# Patient Record
Sex: Female | Born: 1960
Health system: Southern US, Community
[De-identification: ages and names within clinical notes are randomized; demographics above are authoritative.]

## PROBLEM LIST (undated history)

## (undated) DIAGNOSIS — F329 Major depressive disorder, single episode, unspecified: Secondary | ICD-10-CM

## (undated) DIAGNOSIS — F32A Depression, unspecified: Secondary | ICD-10-CM

## (undated) DIAGNOSIS — E785 Hyperlipidemia, unspecified: Secondary | ICD-10-CM

## (undated) DIAGNOSIS — M1990 Unspecified osteoarthritis, unspecified site: Secondary | ICD-10-CM

## (undated) DIAGNOSIS — F419 Anxiety disorder, unspecified: Secondary | ICD-10-CM

## (undated) DIAGNOSIS — G4730 Sleep apnea, unspecified: Secondary | ICD-10-CM

## (undated) DIAGNOSIS — T7840XA Allergy, unspecified, initial encounter: Secondary | ICD-10-CM

## (undated) DIAGNOSIS — F319 Bipolar disorder, unspecified: Secondary | ICD-10-CM

## (undated) DIAGNOSIS — D649 Anemia, unspecified: Secondary | ICD-10-CM

## (undated) HISTORY — DX: Anemia, unspecified: D64.9

## (undated) HISTORY — DX: Hyperlipidemia, unspecified: E78.5

## (undated) HISTORY — DX: Unspecified osteoarthritis, unspecified site: M19.90

## (undated) HISTORY — DX: Bipolar disorder, unspecified: F31.9

## (undated) HISTORY — DX: Depression, unspecified: F32.A

## (undated) HISTORY — DX: Anxiety disorder, unspecified: F41.9

## (undated) HISTORY — DX: Sleep apnea, unspecified: G47.30

## (undated) HISTORY — DX: Major depressive disorder, single episode, unspecified: F32.9

## (undated) HISTORY — PX: SIGMOIDOSCOPY: SUR1295

## (undated) HISTORY — DX: Allergy, unspecified, initial encounter: T78.40XA

---

## 1998-11-07 ENCOUNTER — Other Ambulatory Visit: Admission: RE | Admit: 1998-11-07 | Discharge: 1998-11-07 | Payer: Self-pay | Admitting: Obstetrics and Gynecology

## 1999-04-12 ENCOUNTER — Emergency Department (HOSPITAL_COMMUNITY): Admission: EM | Admit: 1999-04-12 | Discharge: 1999-04-12 | Payer: Self-pay | Admitting: Emergency Medicine

## 2000-02-29 ENCOUNTER — Other Ambulatory Visit: Admission: RE | Admit: 2000-02-29 | Discharge: 2000-02-29 | Payer: Self-pay | Admitting: Obstetrics & Gynecology

## 2001-06-04 ENCOUNTER — Other Ambulatory Visit: Admission: RE | Admit: 2001-06-04 | Discharge: 2001-06-04 | Payer: Self-pay | Admitting: Obstetrics and Gynecology

## 2001-11-28 ENCOUNTER — Inpatient Hospital Stay (HOSPITAL_COMMUNITY): Admission: AD | Admit: 2001-11-28 | Discharge: 2001-11-28 | Payer: Self-pay | Admitting: Obstetrics and Gynecology

## 2003-09-14 ENCOUNTER — Emergency Department (HOSPITAL_COMMUNITY): Admission: EM | Admit: 2003-09-14 | Discharge: 2003-09-14 | Payer: Self-pay | Admitting: Emergency Medicine

## 2003-09-15 ENCOUNTER — Emergency Department (HOSPITAL_COMMUNITY): Admission: EM | Admit: 2003-09-15 | Discharge: 2003-09-15 | Payer: Self-pay | Admitting: Emergency Medicine

## 2003-10-11 ENCOUNTER — Other Ambulatory Visit: Admission: RE | Admit: 2003-10-11 | Discharge: 2003-10-11 | Payer: Self-pay | Admitting: Obstetrics and Gynecology

## 2004-09-12 ENCOUNTER — Other Ambulatory Visit: Admission: RE | Admit: 2004-09-12 | Discharge: 2004-09-12 | Payer: Self-pay | Admitting: Obstetrics and Gynecology

## 2004-09-13 ENCOUNTER — Ambulatory Visit: Payer: Self-pay | Admitting: Internal Medicine

## 2004-10-05 ENCOUNTER — Ambulatory Visit: Payer: Self-pay | Admitting: Internal Medicine

## 2004-12-08 ENCOUNTER — Ambulatory Visit: Payer: Self-pay | Admitting: Internal Medicine

## 2004-12-15 ENCOUNTER — Ambulatory Visit: Payer: Self-pay | Admitting: Internal Medicine

## 2005-02-08 ENCOUNTER — Ambulatory Visit: Payer: Self-pay | Admitting: Internal Medicine

## 2005-08-10 ENCOUNTER — Ambulatory Visit: Payer: Self-pay | Admitting: Internal Medicine

## 2005-09-14 ENCOUNTER — Other Ambulatory Visit: Admission: RE | Admit: 2005-09-14 | Discharge: 2005-09-14 | Payer: Self-pay | Admitting: Obstetrics and Gynecology

## 2006-12-12 ENCOUNTER — Ambulatory Visit: Payer: Self-pay | Admitting: Internal Medicine

## 2007-01-31 ENCOUNTER — Ambulatory Visit: Payer: Self-pay | Admitting: Internal Medicine

## 2007-01-31 LAB — CONVERTED CEMR LAB
ALT: 11 units/L (ref 0–40)
AST: 17 units/L (ref 0–37)
Albumin: 3.1 g/dL — ABNORMAL LOW (ref 3.5–5.2)
Alkaline Phosphatase: 66 units/L (ref 39–117)
BUN: 10 mg/dL (ref 6–23)
Basophils Absolute: 0 10*3/uL (ref 0.0–0.1)
Basophils Relative: 0.2 % (ref 0.0–1.0)
Bilirubin, Direct: 0.1 mg/dL (ref 0.0–0.3)
CO2: 27 meq/L (ref 19–32)
Calcium: 9.1 mg/dL (ref 8.4–10.5)
Chloride: 107 meq/L (ref 96–112)
Cholesterol: 173 mg/dL (ref 0–200)
Creatinine, Ser: 0.7 mg/dL (ref 0.4–1.2)
Eosinophils Absolute: 0.1 10*3/uL (ref 0.0–0.6)
Eosinophils Relative: 0.9 % (ref 0.0–5.0)
GFR calc Af Amer: 117 mL/min
GFR calc non Af Amer: 97 mL/min
Glucose, Bld: 83 mg/dL (ref 70–99)
HCT: 38.5 % (ref 36.0–46.0)
HDL: 54.1 mg/dL (ref >39.0)
Hemoglobin: 13.4 g/dL (ref 12.0–15.0)
LDL Cholesterol: 97 mg/dL (ref 0–99)
Lymphocytes Relative: 51.2 % — ABNORMAL HIGH (ref 12.0–46.0)
MCHC: 34.8 g/dL (ref 30.0–36.0)
MCV: 90.1 fL (ref 78.0–100.0)
Monocytes Absolute: 1 10*3/uL — ABNORMAL HIGH (ref 0.2–0.7)
Monocytes Relative: 10.9 % (ref 3.0–11.0)
Neutro Abs: 3.4 10*3/uL (ref 1.4–7.7)
Neutrophils Relative %: 36.8 % — ABNORMAL LOW (ref 43.0–77.0)
Platelets: 201 10*3/uL (ref 150–400)
Potassium: 4.1 meq/L (ref 3.5–5.1)
RBC: 4.28 M/uL (ref 3.87–5.11)
RDW: 12.2 % (ref 11.5–14.6)
Sodium: 140 meq/L (ref 135–145)
TSH: 1.67 microintl units/mL (ref 0.35–5.50)
Total Bilirubin: 0.5 mg/dL (ref 0.3–1.2)
Total CHOL/HDL Ratio: 3.2
Total Protein: 6.6 g/dL (ref 6.0–8.3)
Triglycerides: 110 mg/dL (ref 0–149)
VLDL: 22 mg/dL (ref 0–40)
WBC: 9.3 10*3/uL (ref 4.5–10.5)

## 2007-02-07 ENCOUNTER — Ambulatory Visit: Payer: Self-pay | Admitting: Internal Medicine

## 2007-06-26 DIAGNOSIS — F329 Major depressive disorder, single episode, unspecified: Secondary | ICD-10-CM

## 2007-06-26 DIAGNOSIS — F419 Anxiety disorder, unspecified: Secondary | ICD-10-CM

## 2007-09-26 ENCOUNTER — Ambulatory Visit: Payer: Self-pay | Admitting: Family Medicine

## 2007-09-26 DIAGNOSIS — E785 Hyperlipidemia, unspecified: Secondary | ICD-10-CM | POA: Insufficient documentation

## 2007-09-26 DIAGNOSIS — L259 Unspecified contact dermatitis, unspecified cause: Secondary | ICD-10-CM

## 2007-09-26 DIAGNOSIS — L280 Lichen simplex chronicus: Secondary | ICD-10-CM

## 2007-09-26 DIAGNOSIS — M549 Dorsalgia, unspecified: Secondary | ICD-10-CM | POA: Insufficient documentation

## 2008-06-28 LAB — CONVERTED CEMR LAB: Pap Smear: NORMAL

## 2008-06-28 LAB — HM MAMMOGRAPHY

## 2009-01-03 ENCOUNTER — Telehealth: Payer: Self-pay | Admitting: Internal Medicine

## 2009-02-25 ENCOUNTER — Telehealth: Payer: Self-pay | Admitting: Internal Medicine

## 2009-04-18 ENCOUNTER — Ambulatory Visit: Payer: Self-pay | Admitting: Internal Medicine

## 2009-04-18 LAB — CONVERTED CEMR LAB
ALT: 9 units/L (ref 0–35)
AST: 21 units/L (ref 0–37)
Albumin: 3.2 g/dL — ABNORMAL LOW (ref 3.5–5.2)
Alkaline Phosphatase: 66 units/L (ref 39–117)
BUN: 10 mg/dL (ref 6–23)
Basophils Absolute: 0 10*3/uL (ref 0.0–0.1)
Basophils Relative: 0.2 % (ref 0.0–3.0)
Bilirubin Urine: NEGATIVE
Bilirubin, Direct: 0 mg/dL (ref 0.0–0.3)
Blood in Urine, dipstick: NEGATIVE
CO2: 26 meq/L (ref 19–32)
Calcium: 8.9 mg/dL (ref 8.4–10.5)
Chloride: 107 meq/L (ref 96–112)
Cholesterol: 181 mg/dL (ref 0–200)
Creatinine, Ser: 0.7 mg/dL (ref 0.4–1.2)
Eosinophils Absolute: 0.1 10*3/uL (ref 0.0–0.7)
Eosinophils Relative: 1.1 % (ref 0.0–5.0)
GFR calc non Af Amer: 115.9 mL/min (ref >60)
Glucose, Bld: 80 mg/dL (ref 70–99)
Glucose, Urine, Semiquant: NEGATIVE
HCT: 39.1 % (ref 36.0–46.0)
HDL: 59.5 mg/dL (ref >39.00)
Hemoglobin: 13.4 g/dL (ref 12.0–15.0)
Ketones, urine, test strip: NEGATIVE
LDL Cholesterol: 95 mg/dL (ref 0–99)
Lymphocytes Relative: 56.1 % — ABNORMAL HIGH (ref 12.0–46.0)
Lymphs Abs: 5.2 10*3/uL — ABNORMAL HIGH (ref 0.7–4.0)
MCHC: 34.2 g/dL (ref 30.0–36.0)
MCV: 91.8 fL (ref 78.0–100.0)
Monocytes Absolute: 0.8 10*3/uL (ref 0.1–1.0)
Monocytes Relative: 8.6 % (ref 3.0–12.0)
Neutro Abs: 3.1 10*3/uL (ref 1.4–7.7)
Neutrophils Relative %: 34 % — ABNORMAL LOW (ref 43.0–77.0)
Nitrite: NEGATIVE
Platelets: 174 10*3/uL (ref 150.0–400.0)
Potassium: 4.2 meq/L (ref 3.5–5.1)
Protein, U semiquant: NEGATIVE
RBC: 4.26 M/uL (ref 3.87–5.11)
RDW: 11.8 % (ref 11.5–14.6)
Sodium: 141 meq/L (ref 135–145)
Specific Gravity, Urine: 1.02
TSH: 1.57 microintl units/mL (ref 0.35–5.50)
Total Bilirubin: 0.7 mg/dL (ref 0.3–1.2)
Total CHOL/HDL Ratio: 3
Total Protein: 6.9 g/dL (ref 6.0–8.3)
Triglycerides: 132 mg/dL (ref 0.0–149.0)
Urobilinogen, UA: 0.2
VLDL: 26.4 mg/dL (ref 0.0–40.0)
WBC Urine, dipstick: NEGATIVE
WBC: 9.2 10*3/uL (ref 4.5–10.5)
pH: 6

## 2009-04-25 ENCOUNTER — Ambulatory Visit: Payer: Self-pay | Admitting: Internal Medicine

## 2009-06-07 ENCOUNTER — Ambulatory Visit: Payer: Self-pay | Admitting: Internal Medicine

## 2009-06-07 LAB — CONVERTED CEMR LAB
ALT: 9 units/L (ref 0–35)
AST: 18 units/L (ref 0–37)
Albumin: 3.2 g/dL — ABNORMAL LOW (ref 3.5–5.2)
Alkaline Phosphatase: 64 units/L (ref 39–117)
Bilirubin, Direct: 0 mg/dL (ref 0.0–0.3)
Cholesterol: 196 mg/dL (ref 0–200)
HDL: 50.2 mg/dL (ref >39.00)
LDL Cholesterol: 118 mg/dL — ABNORMAL HIGH (ref 0–99)
Total Bilirubin: 0.5 mg/dL (ref 0.3–1.2)
Total CHOL/HDL Ratio: 4
Total Protein: 7 g/dL (ref 6.0–8.3)
Triglycerides: 137 mg/dL (ref 0.0–149.0)
VLDL: 27.4 mg/dL (ref 0.0–40.0)

## 2009-06-14 ENCOUNTER — Ambulatory Visit: Payer: Self-pay | Admitting: Internal Medicine

## 2009-06-24 ENCOUNTER — Encounter: Payer: Self-pay | Admitting: Internal Medicine

## 2009-09-26 ENCOUNTER — Ambulatory Visit: Payer: Self-pay | Admitting: Internal Medicine

## 2009-09-26 DIAGNOSIS — R635 Abnormal weight gain: Secondary | ICD-10-CM | POA: Insufficient documentation

## 2010-03-16 ENCOUNTER — Telehealth: Payer: Self-pay | Admitting: Internal Medicine

## 2010-04-18 ENCOUNTER — Ambulatory Visit: Payer: Self-pay | Admitting: Internal Medicine

## 2010-04-18 DIAGNOSIS — B353 Tinea pedis: Secondary | ICD-10-CM | POA: Insufficient documentation

## 2010-04-18 LAB — CONVERTED CEMR LAB
Cholesterol, target level: 200 mg/dL
HDL goal, serum: 40 mg/dL
LDL Goal: 160 mg/dL

## 2010-07-19 ENCOUNTER — Ambulatory Visit: Payer: Self-pay | Admitting: Internal Medicine

## 2010-07-19 LAB — CONVERTED CEMR LAB
ALT: 9 units/L (ref 0–35)
AST: 16 units/L (ref 0–37)
Albumin: 3.3 g/dL — ABNORMAL LOW (ref 3.5–5.2)
Alkaline Phosphatase: 61 units/L (ref 39–117)
BUN: 9 mg/dL (ref 6–23)
Basophils Absolute: 0 10*3/uL (ref 0.0–0.1)
Basophils Relative: 0.5 % (ref 0.0–3.0)
Bilirubin, Direct: 0.1 mg/dL (ref 0.0–0.3)
Blood in Urine, dipstick: NEGATIVE
CO2: 27 meq/L (ref 19–32)
Calcium: 9.1 mg/dL (ref 8.4–10.5)
Chloride: 105 meq/L (ref 96–112)
Cholesterol: 217 mg/dL — ABNORMAL HIGH (ref 0–200)
Creatinine, Ser: 0.6 mg/dL (ref 0.4–1.2)
Direct LDL: 145.4 mg/dL
Eosinophils Absolute: 0.1 10*3/uL (ref 0.0–0.7)
Eosinophils Relative: 0.8 % (ref 0.0–5.0)
GFR calc non Af Amer: 130.17 mL/min (ref >60)
Glucose, Bld: 75 mg/dL (ref 70–99)
Glucose, Urine, Semiquant: NEGATIVE
HCT: 38.5 % (ref 36.0–46.0)
HDL: 51 mg/dL (ref >39.00)
Hemoglobin: 13.2 g/dL (ref 12.0–15.0)
Lymphocytes Relative: 54.4 % — ABNORMAL HIGH (ref 12.0–46.0)
Lymphs Abs: 4.7 10*3/uL — ABNORMAL HIGH (ref 0.7–4.0)
MCHC: 34.3 g/dL (ref 30.0–36.0)
MCV: 94 fL (ref 78.0–100.0)
Monocytes Absolute: 0.7 10*3/uL (ref 0.1–1.0)
Monocytes Relative: 8.4 % (ref 3.0–12.0)
Neutro Abs: 3.1 10*3/uL (ref 1.4–7.7)
Neutrophils Relative %: 35.9 % — ABNORMAL LOW (ref 43.0–77.0)
Nitrite: NEGATIVE
Platelets: 183 10*3/uL (ref 150.0–400.0)
Potassium: 4.1 meq/L (ref 3.5–5.1)
RBC: 4.09 M/uL (ref 3.87–5.11)
RDW: 12.6 % (ref 11.5–14.6)
Sodium: 139 meq/L (ref 135–145)
Specific Gravity, Urine: 1.02
TSH: 1.99 microintl units/mL (ref 0.35–5.50)
Total Bilirubin: 0.3 mg/dL (ref 0.3–1.2)
Total CHOL/HDL Ratio: 4
Total Protein: 6.4 g/dL (ref 6.0–8.3)
Triglycerides: 151 mg/dL — ABNORMAL HIGH (ref 0.0–149.0)
Urobilinogen, UA: 0.2
VLDL: 30.2 mg/dL (ref 0.0–40.0)
WBC Urine, dipstick: NEGATIVE
WBC: 8.6 10*3/uL (ref 4.5–10.5)
pH: 6.5

## 2010-08-02 ENCOUNTER — Ambulatory Visit: Payer: Self-pay | Admitting: Internal Medicine

## 2010-08-03 ENCOUNTER — Telehealth (INDEPENDENT_AMBULATORY_CARE_PROVIDER_SITE_OTHER): Payer: Self-pay | Admitting: *Deleted

## 2010-10-02 ENCOUNTER — Telehealth: Payer: Self-pay | Admitting: Internal Medicine

## 2010-10-31 ENCOUNTER — Ambulatory Visit
Admission: RE | Admit: 2010-10-31 | Discharge: 2010-10-31 | Payer: Self-pay | Source: Home / Self Care | Attending: Internal Medicine | Admitting: Internal Medicine

## 2010-10-31 LAB — CONVERTED CEMR LAB
ALT: 9 units/L (ref 0–35)
AST: 16 units/L (ref 0–37)
Albumin: 3.1 g/dL — ABNORMAL LOW (ref 3.5–5.2)
Alkaline Phosphatase: 62 units/L (ref 39–117)
Bilirubin, Direct: 0.1 mg/dL (ref 0.0–0.3)
Cholesterol: 185 mg/dL (ref 0–200)
HDL: 52.4 mg/dL (ref >39.00)
LDL Cholesterol: 103 mg/dL — ABNORMAL HIGH (ref 0–99)
Total Bilirubin: 0.5 mg/dL (ref 0.3–1.2)
Total CHOL/HDL Ratio: 4
Total Protein: 7 g/dL (ref 6.0–8.3)
Triglycerides: 150 mg/dL — ABNORMAL HIGH (ref 0.0–149.0)
VLDL: 30 mg/dL (ref 0.0–40.0)

## 2010-11-10 ENCOUNTER — Ambulatory Visit
Admission: RE | Admit: 2010-11-10 | Discharge: 2010-11-10 | Payer: Self-pay | Source: Home / Self Care | Attending: Internal Medicine | Admitting: Internal Medicine

## 2010-11-28 NOTE — Progress Notes (Signed)
Summary: allergic reaction   LMTCB 03/17/2010  Phone Note Call from Patient   Caller: Patient Call For: Stacie Glaze MD Summary of Call: Pt states she developed blisters and itching on both feet after wearing a new pair of [place] shoes without socks.  She had a similar experience last year after wearing flip flops, and was told she may have a latex allergy.  She took Allegra for the itching and Cortisone cream on her feet.  Asking for advice from Dr Lovell Sheehan. [phone] Initial call taken by: Lynann Beaver CMA,  Mar 16, 2010 4:38 PM  Follow-up for Phone Call        call in lotrisome cream and apply two times a day 30 grams Follow-up by: Stacie Glaze MD,  Mar 17, 2010 8:07 AM  Additional Follow-up for Phone Call Additional follow up Details #1::        [facility] ([facility]) with name of pharmacy.  Pt called back to adv  [facility] on [address].... Debbra Riding, Mar 17, 2010 11:56AM  Additional Follow-up by: Lynann Beaver CMA,  Mar 17, 2010 8:45 AM    New/Updated Medications: LOTRISONE 1-0.05 % LOTN (CLOTRIMAZOLE-BETAMETHASONE) apply two times a day Prescriptions: LOTRISONE 1-0.05 % LOTN (CLOTRIMAZOLE-BETAMETHASONE) apply two times a day  #30 gm x 0   Entered by:   Lynann Beaver CMA   Authorized by:   Stacie Glaze MD   Signed by:   Lynann Beaver CMA on 03/17/2010   Method used:   Electronically to        [facility] [phone]* (retail)       [address]       [place], [place]  [address]       Ph: [phone]       Fax: [phone]   RxID:   [id number]

## 2010-11-28 NOTE — Assessment & Plan Note (Signed)
Summary: CPX/NO PAP/NJR   Vital Signs:  Patient profile:   50 year old female Height:      65 inches Weight:      176 pounds Temp:     98.2 degrees F oral Pulse rate:   72 / minute Resp:     14 per minute BP sitting:   110 / 80  (left arm)  Vitals Entered By: Willy Eddy, LPN (August 02, 2010 2:16 PM) CC: roa labs, Lipid Management, Hypertension Management Is Patient Diabetic? No   Primary Care Provider:  Stacie Glaze MD  CC:  roa labs, Lipid Management, and Hypertension Management.  History of Present Illness: does not want CPX prefers monitering of chronic conditions monitering lipids on fish oil and niacin but the lipids have continues to risk mother had elevated lipids  Hypertension History:      She denies headache, chest pain, palpitations, dyspnea with exertion, orthopnea, PND, peripheral edema, visual symptoms, neurologic problems, syncope, and side effects from treatment.        Positive major cardiovascular risk factors include hyperlipidemia.  Negative major cardiovascular risk factors include female age less than 97 years old, no history of hypertension, and non-tobacco-user status.        Further assessment for target organ damage reveals no history of ASHD, stroke/TIA, or peripheral vascular disease.    Lipid Management History:      Negative NCEP/ATP III risk factors include female age less than 60 years old, non-tobacco-user status, non-hypertensive, no ASHD (atherosclerotic heart disease), no prior stroke/TIA, no peripheral vascular disease, and no history of aortic aneurysm.      Preventive Screening-Counseling & Management  Alcohol-Tobacco     Smoking Status: never     Tobacco Counseling: not indicated; no tobacco use  Current Problems (verified): 1)  Dermatophytosis of Foot  (ICD-110.4) 2)  Weight Gain  (ICD-783.1) 3)  Preventive Health Care  (ICD-V70.0) 4)  Lichenification  (ICD-698.3) 5)  Contact Dermatitis  (ICD-692.9) 6)   Hyperlipidemia  (ICD-272.4) 7)  Family History Diabetes 1st Degree Relative  (ICD-V18.0) 8)  Family History of Colon Ca 1st Degree Relative <60  (ICD-V16.0) 9)  Family History of Cad Female 1st Degree Relative <50  (ICD-V17.3) 10)  Back Pain, Chronic  (ICD-724.5) 11)  Depression  (ICD-311)  Current Medications (verified): 1)  Zoloft 50 Mg  Tabs (Sertraline Hcl) .... Take 1 Tablet By Mouth Once A Day 2)  Depakote 125 Mg  Tbec (Divalproex Sodium) .... Take 3 Tablet By Mouth At Bedtime 3)  Yaz 3-0.02 Mg  Tabs (Drospirenone-Ethinyl Estradiol) .... Take 1 Tablet By Mouth Once A Day 4)  Ambien 10 Mg  Tabs (Zolpidem Tartrate) .... As Needed 5)  Diprolene Af 0.05 %  Crea (Aug Betamethasone Dipropionate) .... Apply Three Times A Day As Needed Rash 6)  Lac-Hydrin 12 %  Crea (Ammonium Lactate) .... Apply Two Times A Day To Soles of Feet 7)  Krill Oil 1000 Mg Caps (Krill Oil) .... One By Mouth Bid 8)  Niacin Flush Free 500 Mg Caps (Inositol Niacinate) .... One By Mouth Daily Witha Low Fat Snak 9)  Lotrisone 1-0.05 % Lotn (Clotrimazole-Betamethasone) .... Apply Two Times A Day 10)  Livalo 4 Mg Tabs (Pitavastatin Calcium) .... One By Mouth Weekly  Allergies (verified): 1)  ! Penicillin V Potassium (Penicillin V Potassium)  Contraindications/Deferment of Procedures/Staging:    Test/Procedure: FLU VAX    Reason for deferment: patient declined   Past History:  Family History: Last  updated: 09/26/2007 Family History of CAD Female 1st degree relative <50 Family History of Colon CA 1st degree relative <60 Family History Diabetes 1st degree relative Family History Hypertension  Risk Factors: Smoking Status: never (08/02/2010)  Past medical, surgical, family and social histories (including risk factors) reviewed, and no changes noted (except as noted below).  Past Medical History: Reviewed history from 09/26/2007 and no changes required. Depression Hyperlipidemia  Past Surgical History: Reviewed  history from 09/26/2007 and no changes required. Caesarean section Sigmoidoscopy  Family History: Reviewed history from 09/26/2007 and no changes required. Family History of CAD Female 1st degree relative <50 Family History of Colon CA 1st degree relative <60 Family History Diabetes 1st degree relative Family History Hypertension  Social History: Reviewed history and no changes required.  Review of Systems  The patient denies anorexia, fever, weight loss, weight gain, vision loss, decreased hearing, hoarseness, chest pain, syncope, dyspnea on exertion, peripheral edema, prolonged cough, headaches, hemoptysis, abdominal pain, melena, hematochezia, severe indigestion/heartburn, hematuria, incontinence, genital sores, muscle weakness, suspicious skin lesions, transient blindness, difficulty walking, depression, unusual weight change, abnormal bleeding, enlarged lymph nodes, angioedema, and breast masses.    Physical Exam  General:  Well-developed,well-nourished,in no acute distress; alert,appropriate and cooperative throughout examination Head:  normocephalic and no abnormalities palpated.   Eyes:  pupils equal and pupils round.   Nose:  no external deformity and no nasal discharge.   Neck:  No deformities, masses, or tenderness noted. Lungs:  normal respiratory effort, no crackles, and no wheezes.   Heart:  normal rate and regular rhythm.   Abdomen:  Bowel sounds positive,abdomen soft and non-tender without masses, organomegaly or hernias noted. Msk:  No deformity or scoliosis noted of thoracic or lumbar spine.   Extremities:  No clubbing, cyanosis, edema, or deformity noted with normal full range of motion of all joints.   Neurologic:  alert & oriented X3 and finger-to-nose normal.     Impression & Recommendations:  Problem # 1:  HYPERLIPIDEMIA (ICD-272.4) Assessment Deteriorated pulse of statin trial livalo one by mouth weekly Labs Reviewed: SGOT: 16 (07/19/2010)   SGPT: 9  (07/19/2010)  Lipid Goals: Chol Goal: 200 (04/18/2010)   HDL Goal: 40 (04/18/2010)   LDL Goal: 160 (04/18/2010)   TG Goal: 150 (04/18/2010)  Prior 10 Yr Risk Heart Disease: 3 % (04/18/2010)   HDL:51.00 (07/19/2010), 50.20 (06/07/2009)  LDL:118 (06/07/2009), 95 (16/07/9603)  Chol:217 (07/19/2010), 196 (06/07/2009)  Trig:151.0 (07/19/2010), 137.0 (06/07/2009)  Her updated medication list for this problem includes:    Livalo 4 Mg Tabs (Pitavastatin calcium) ..... One by mouth weekly  Problem # 2:  DEPRESSION (ICD-311)  Her updated medication list for this problem includes:    Zoloft 50 Mg Tabs (Sertraline hcl) .Marland Kitchen... Take 1 tablet by mouth once a day  Discussed treatment options, including trial of antidpressant medication. Will refer to behavioral health. Follow-up call in in 24-48 hours and recheck in 2 weeks, sooner as needed. Patient agrees to call if any worsening of symptoms or thoughts of doing harm arise. Verified that the patient has no suicidal ideation at this time.   Problem # 3:  PREVENTIVE HEALTH CARE (ICD-V70.0)  Mammogram: normal (06/28/2008) Pap smear: normal (06/28/2008) Td Booster: Tdap (02/07/2007)   Chol: 217 (07/19/2010)   HDL: 51.00 (07/19/2010)   LDL: 118 (06/07/2009)   TG: 151.0 (07/19/2010) TSH: 1.99 (07/19/2010)   Next mammogram due:: 06/2009 (04/25/2009)  Discussed using sunscreen, use of alcohol, drug use, self breast exam, routine dental care, routine eye  care, schedule for GYN exam, routine physical exam, seat belts, multiple vitamins, osteoporosis prevention, adequate calcium intake in diet, recommendations for immunizations, mammograms and Pap smears.  Discussed exercise and checking cholesterol.  Discussed gun safety, safe sex, and contraception.  Complete Medication List: 1)  Zoloft 50 Mg Tabs (Sertraline hcl) .... Take 1 tablet by mouth once a day 2)  Depakote 125 Mg Tbec (Divalproex sodium) .... Take 3 tablet by mouth at bedtime 3)  Yaz 3-0.02 Mg Tabs  (Drospirenone-ethinyl estradiol) .... Take 1 tablet by mouth once a day 4)  Ambien 10 Mg Tabs (Zolpidem tartrate) .... As needed 5)  Diprolene Af 0.05 % Crea (Aug betamethasone dipropionate) .... Apply three times a day as needed rash 6)  Lac-hydrin 12 % Crea (Ammonium lactate) .... Apply two times a day to soles of feet 7)  Krill Oil 1000 Mg Caps (Krill oil) .... One by mouth bid 8)  Niacin Flush Free 500 Mg Caps (Inositol niacinate) .... One by mouth daily witha low fat snak 9)  Lotrisone 1-0.05 % Lotn (Clotrimazole-betamethasone) .... Apply two times a day 10)  Livalo 4 Mg Tabs (Pitavastatin calcium) .... One by mouth weekly  Hypertension Assessment/Plan:      The patient's hypertensive risk group is category B: At least one risk factor (excluding diabetes) with no target organ damage.  Her calculated 10 year risk of coronary heart disease is 3 %.  Today's blood pressure is 110/80.  Her blood pressure goal is < 140/90.  Lipid Assessment/Plan:      Based on NCEP/ATP III, the patient's risk factor category is "0-1 risk factors".  The patient's lipid goals are as follows: Total cholesterol goal is 200; LDL cholesterol goal is 160; HDL cholesterol goal is 40; Triglyceride goal is 150.    Patient Instructions: 1)  Please schedule a follow-up appointment in 3 months. 2)  Hepatic Panel prior to visit, ICD-9:995.20 3)  Lipid Panel prior to visit, ICD-9:272.4

## 2010-11-28 NOTE — Progress Notes (Signed)
  Phone Note Call from Patient   Caller: Patient Call For: Stacie Glaze MD Reason for Call: Acute Illness Summary of Call: Pt. only states she has sinus infection and would like an antibiotic. [phone] Initial call taken by: [facility] CMA AAMA,  October 02, 2010 10:27 AM  Follow-up for Phone Call        per dr Lovell Sheehan- may have z pack Follow-up by: Willy Eddy, LPN,  October 02, 2010 11:23 AM    New/Updated Medications: ZITHROMAX Z-PAK 250 MG TABS (AZITHROMYCIN) as directed Prescriptions: ZITHROMAX Z-PAK 250 MG TABS (AZITHROMYCIN) as directed  #6 x 0   Entered by:   Lynann Beaver CMA AAMA   Authorized by:   Stacie Glaze MD   Signed by:   Lynann Beaver CMA AAMA on 10/02/2010   Method used:   Electronically to        [facility] [phone]* (retail)       [address]       [place], [place]  [address]       Ph: [phone]       Fax: [phone]   RxID:   [id number]  Notified pt.

## 2010-11-28 NOTE — Progress Notes (Signed)
Summary: work note  Phone Note Call from Patient Call back at [employer] [phone]   Caller: Patient Call For: Stacie Glaze MD Reason for Call: Acute Illness Summary of Call: pt needs work note she was seen 08-02-2010 fax to her attention at [phone]. Initial call taken by: Heron Sabins,  August 03, 2010 8:46 AM  Follow-up for Phone Call        sent up front to be faxed Follow-up by: Willy Eddy, LPN,  August 03, 2010 8:53 AM

## 2010-11-28 NOTE — Assessment & Plan Note (Signed)
Summary: 7 MTH ROA // RS   Vital Signs:  Patient profile:   50 year old female Height:      65 inches Weight:      178 pounds BMI:     29.73 Temp:     98.2 degrees F oral Pulse rate:   72 / minute BP sitting:   120 / 74  (left arm)  Vitals Entered By: Willy Eddy, LPN (April 18, 2010 9:34 AM) CC: roa, Lipid Management   CC:  roa and Lipid Management.  History of Present Illness: some type of allergic reaction to new shoe with blisters on feet had bollous lesion and still has some discoloration using the lotrimin cream went to the podiatrist latex allergies   Lipid Management History:      Negative NCEP/ATP III risk factors include female age less than 4 years old, non-tobacco-user status, non-hypertensive, no ASHD (atherosclerotic heart disease), no prior stroke/TIA, no peripheral vascular disease, and no history of aortic aneurysm.     Preventive Screening-Counseling & Management  Alcohol-Tobacco     Smoking Status: never  Current Problems (verified): 1)  Weight Gain  (ICD-783.1) 2)  Preventive Health Care  (ICD-V70.0) 3)  Lichenification  (ICD-698.3) 4)  Contact Dermatitis  (ICD-692.9) 5)  Hyperlipidemia  (ICD-272.4) 6)  Family History Diabetes 1st Degree Relative  (ICD-V18.0) 7)  Family History of Colon Ca 1st Degree Relative <60  (ICD-V16.0) 8)  Family History of Cad Female 1st Degree Relative <50  (ICD-V17.3) 9)  Back Pain, Chronic  (ICD-724.5) 10)  Depression  (ICD-311)  Current Medications (verified): 1)  Zoloft 50 Mg  Tabs (Sertraline Hcl) .... Take 1 Tablet By Mouth Once A Day 2)  Depakote 125 Mg  Tbec (Divalproex Sodium) .... Take 3 Tablet By Mouth At Bedtime 3)  Yaz 3-0.02 Mg  Tabs (Drospirenone-Ethinyl Estradiol) .... Take 1 Tablet By Mouth Once A Day 4)  Ambien 10 Mg  Tabs (Zolpidem Tartrate) .... As Needed 5)  Diprolene Af 0.05 %  Crea (Aug Betamethasone Dipropionate) .... Apply Three Times A Day As Needed Rash 6)  Lac-Hydrin 12 %  Crea  (Ammonium Lactate) .... Apply Two Times A Day To Soles of Feet 7)  Krill Oil 1000 Mg Caps (Krill Oil) .... One By Mouth Bid 8)  Niacin Flush Free 500 Mg Caps (Inositol Niacinate) .... One By Mouth Daily Witha Low Fat Snak 9)  Lotrisone 1-0.05 % Lotn (Clotrimazole-Betamethasone) .... Apply Two Times A Day  Allergies (verified): 1)  ! Penicillin V Potassium (Penicillin V Potassium)  Past History:  Family History: Last updated: 09/26/2007 Family History of CAD Female 1st degree relative <50 Family History of Colon CA 1st degree relative <60 Family History Diabetes 1st degree relative Family History Hypertension  Risk Factors: Smoking Status: never (04/18/2010)  Past medical, surgical, family and social histories (including risk factors) reviewed, and no changes noted (except as noted below).  Past Medical History: Reviewed history from 09/26/2007 and no changes required. Depression Hyperlipidemia  Past Surgical History: Reviewed history from 09/26/2007 and no changes required. Caesarean section Sigmoidoscopy  Family History: Reviewed history from 09/26/2007 and no changes required. Family History of CAD Female 1st degree relative <50 Family History of Colon CA 1st degree relative <60 Family History Diabetes 1st degree relative Family History Hypertension  Social History: Reviewed history and no changes required. Smoking Status:  never  Review of Systems  The patient denies anorexia, fever, weight loss, weight gain, vision loss, decreased hearing, hoarseness, chest pain,  syncope, dyspnea on exertion, peripheral edema, prolonged cough, headaches, hemoptysis, abdominal pain, melena, hematochezia, severe indigestion/heartburn, hematuria, incontinence, genital sores, muscle weakness, suspicious skin lesions, transient blindness, difficulty walking, depression, unusual weight change, abnormal bleeding, enlarged lymph nodes, angioedema, and breast masses.    Physical Exam  General:   Well-developed,well-nourished,in no acute distress; alert,appropriate and cooperative throughout examination Ears:  External ear exam shows no significant lesions or deformities.  Otoscopic examination reveals clear canals, tympanic membranes are intact bilaterally without bulging, retraction, inflammation or discharge. Hearing is grossly normal bilaterally. Nose:  External nasal examination shows no deformity or inflammation. Nasal mucosa are pink and moist without lesions or exudates. Mouth:  Oral mucosa and oropharynx without lesions or exudates.  Teeth in good repair. Neck:  No deformities, masses, or tenderness noted. Lungs:  normal respiratory effort, no crackles, and no wheezes.   Heart:  normal rate and regular rhythm.   Abdomen:  Bowel sounds positive,abdomen soft and non-tender without masses, organomegaly or hernias noted. Msk:  No deformity or scoliosis noted of thoracic or lumbar spine.   Neurologic:  No cranial nerve deficits noted. Station and gait are normal. Plantar reflexes are down-going bilaterally. DTRs are symmetrical throughout. Sensory, motor and coordinative functions appear intact.   Impression & Recommendations:  Problem # 1:  DERMATOPHYTOSIS OF FOOT (ICD-110.4) the pt needs to tak the lamisil for 90 dermatologist added the oral terbinofin  added Her updated medication list for this problem includes:    Lotrisone 1-0.05 % Lotn (Clotrimazole-betamethasone) .Marland Kitchen... Apply two times a day    Terbinafine Hcl 250 Mg Tabs (Terbinafine hcl) ..... One by mouth daly  Take medication as directed for full duration.   Problem # 2:  HYPERLIPIDEMIA (ICD-272.4)  Labs Reviewed: SGOT: 18 (06/07/2009)   SGPT: 9 (06/07/2009)  Lipid Goals: Chol Goal: 200 (04/18/2010)   HDL Goal: 40 (04/18/2010)   LDL Goal: 160 (04/18/2010)   TG Goal: 150 (04/18/2010)  10 Yr Risk Heart Disease: 3 %   HDL:50.20 (06/07/2009), 59.50 (04/18/2009)  LDL:118 (06/07/2009), 95 (04/54/0981)  Chol:196  (06/07/2009), 181 (04/18/2009)  Trig:137.0 (06/07/2009), 132.0 (04/18/2009)  Problem # 3:  BACK PAIN, CHRONIC (ICD-724.5)  Discussed use of moist heat or ice, modified activities, medications, and stretching/strengthening exercises. Back care instructions given. To be seen in 2 weeks if no improvement; sooner if worsening of symptoms.   Complete Medication List: 1)  Zoloft 50 Mg Tabs (Sertraline hcl) .... Take 1 tablet by mouth once a day 2)  Depakote 125 Mg Tbec (Divalproex sodium) .... Take 3 tablet by mouth at bedtime 3)  Yaz 3-0.02 Mg Tabs (Drospirenone-ethinyl estradiol) .... Take 1 tablet by mouth once a day 4)  Ambien 10 Mg Tabs (Zolpidem tartrate) .... As needed 5)  Diprolene Af 0.05 % Crea (Aug betamethasone dipropionate) .... Apply three times a day as needed rash 6)  Lac-hydrin 12 % Crea (Ammonium lactate) .... Apply two times a day to soles of feet 7)  Krill Oil 1000 Mg Caps (Krill oil) .... One by mouth bid 8)  Niacin Flush Free 500 Mg Caps (Inositol niacinate) .... One by mouth daily witha low fat snak 9)  Lotrisone 1-0.05 % Lotn (Clotrimazole-betamethasone) .... Apply two times a day 10)  Terbinafine Hcl 250 Mg Tabs (Terbinafine hcl) .... One by mouth daly  Lipid Assessment/Plan:      Based on NCEP/ATP III, the patient's risk factor category is "0-1 risk factors".  The patient's lipid goals are as follows: Total cholesterol goal is 200;  LDL cholesterol goal is 160; HDL cholesterol goal is 40; Triglyceride goal is 150.    Patient Instructions: 1)  Please schedule a follow-up appointment in 2 months.  CPX 2)  spray all shoes with antifungal spray

## 2010-11-30 NOTE — Assessment & Plan Note (Signed)
Summary: 3 MTH ROV // RS   Vital Signs:  Patient profile:   50 year old female Height:      65 inches Weight:      180 pounds BMI:     30.06 Temp:     98.2 degrees F oral Pulse rate:   72 / minute Resp:     14 per minute BP sitting:   136 / 80  (left arm)  Vitals Entered By: Willy Eddy, LPN (November 10, 2010 4:26 PM) CC: roa labs Is Patient Diabetic? No   Primary Care Provider:  Stacie Glaze MD  CC:  roa labs.  History of Present Illness:   the patient is is 50 year old African American female who presents for a visit for chronic management of lipids with an acute flare of her chronic back pain. she has a history of musculoskeletal pain in the low back primarily in the SI joint region is exacerbated by both her weight and by musculoskeletal strain. In the past this has responded to a brief course of steroids muscle relaxants and nonsteroidals. She has a history of hyperlipidemia currently on combination of  omega 3 , statin , and niacin.  Preventive Screening-Counseling & Management  Alcohol-Tobacco     Smoking Status: never     Tobacco Counseling: not indicated; no tobacco use  Problems Prior to Update: 1)  Dermatophytosis of Foot  (ICD-110.4) 2)  Weight Gain  (ICD-783.1) 3)  Preventive Health Care  (ICD-V70.0) 4)  Lichenification  (ICD-698.3) 5)  Contact Dermatitis  (ICD-692.9) 6)  Hyperlipidemia  (ICD-272.4) 7)  Family History Diabetes 1st Degree Relative  (ICD-V18.0) 8)  Family History of Colon Ca 1st Degree Relative <60  (ICD-V16.0) 9)  Family History of Cad Female 1st Degree Relative <50  (ICD-V17.3) 10)  Back Pain, Chronic  (ICD-724.5) 11)  Depression  (ICD-311)  Current Problems (verified): 1)  Dermatophytosis of Foot  (ICD-110.4) 2)  Weight Gain  (ICD-783.1) 3)  Preventive Health Care  (ICD-V70.0) 4)  Lichenification  (ICD-698.3) 5)  Contact Dermatitis  (ICD-692.9) 6)  Hyperlipidemia  (ICD-272.4) 7)  Family History Diabetes 1st Degree Relative   (ICD-V18.0) 8)  Family History of Colon Ca 1st Degree Relative <60  (ICD-V16.0) 9)  Family History of Cad Female 1st Degree Relative <50  (ICD-V17.3) 10)  Back Pain, Chronic  (ICD-724.5) 11)  Depression  (ICD-311)  Medications Prior to Update: 1)  Zoloft 50 Mg  Tabs (Sertraline Hcl) .... Take 1 Tablet By Mouth Once A Day 2)  Depakote 125 Mg  Tbec (Divalproex Sodium) .... Take 3 Tablet By Mouth At Bedtime 3)  Yaz 3-0.02 Mg  Tabs (Drospirenone-Ethinyl Estradiol) .... Take 1 Tablet By Mouth Once A Day 4)  Ambien 10 Mg  Tabs (Zolpidem Tartrate) .... As Needed 5)  Diprolene Af 0.05 %  Crea (Aug Betamethasone Dipropionate) .... Apply Three Times A Day As Needed Rash 6)  Lac-Hydrin 12 %  Crea (Ammonium Lactate) .... Apply Two Times A Day To Soles of Feet 7)  Krill Oil 1000 Mg Caps (Krill Oil) .... One By Mouth Bid 8)  Niacin Flush Free 500 Mg Caps (Inositol Niacinate) .... One By Mouth Daily Witha Low Fat Snak 9)  Lotrisone 1-0.05 % Lotn (Clotrimazole-Betamethasone) .... Apply Two Times A Day 10)  Livalo 4 Mg Tabs (Pitavastatin Calcium) .... One By Mouth Weekly 11)  Zithromax Z-Pak 250 Mg Tabs (Azithromycin) .... As Directed  Current Medications (verified): 1)  Zoloft 50 Mg  Tabs (Sertraline Hcl) .Marland KitchenMarland KitchenMarland Kitchen  Take 1 Tablet By Mouth Once A Day 2)  Depakote 125 Mg  Tbec (Divalproex Sodium) .... Take 3 Tablet By Mouth At Bedtime 3)  Yaz 3-0.02 Mg  Tabs (Drospirenone-Ethinyl Estradiol) .... Take 1 Tablet By Mouth Once A Day 4)  Ambien 10 Mg  Tabs (Zolpidem Tartrate) .... As Needed 5)  Krill Oil 1000 Mg Caps (Krill Oil) .... One By Mouth Bid 6)  Niacin Flush Free 500 Mg Caps (Inositol Niacinate) .... One By Mouth Daily Witha Low Fat Snak 7)  Lotrisone 1-0.05 % Lotn (Clotrimazole-Betamethasone) .... Apply Two Times A Day 8)  Livalo 4 Mg Tabs (Pitavastatin Calcium) .... One By Mouth Weekly  Allergies (verified): 1)  ! Penicillin V Potassium (Penicillin V Potassium)  Past History:  Past Medical  History: Last updated: 09/26/2007 Depression Hyperlipidemia  Past Surgical History: Last updated: 09/26/2007 Caesarean section Sigmoidoscopy  Family History: Last updated: 09/26/2007 Family History of CAD Female 1st degree relative <50 Family History of Colon CA 1st degree relative <60 Family History Diabetes 1st degree relative Family History Hypertension  Risk Factors: Smoking Status: never (11/10/2010)  Family History: Reviewed history from 09/26/2007 and no changes required. Family History of CAD Female 1st degree relative <50 Family History of Colon CA 1st degree relative <60 Family History Diabetes 1st degree relative Family History Hypertension  Social History: Reviewed history and no changes required.  Review of Systems  The patient denies anorexia, fever, weight loss, weight gain, vision loss, decreased hearing, hoarseness, chest pain, syncope, dyspnea on exertion, peripheral edema, prolonged cough, headaches, hemoptysis, abdominal pain, melena, hematochezia, severe indigestion/heartburn, hematuria, incontinence, genital sores, muscle weakness, suspicious skin lesions, transient blindness, difficulty walking, depression, unusual weight change, abnormal bleeding, enlarged lymph nodes, angioedema, and breast masses.    Physical Exam  General:  Well-developed,well-nourished,in no acute distress; alert,appropriate and cooperative throughout examination Head:  normocephalic and no abnormalities palpated.   Eyes:  pupils equal and pupils round.   Ears:  External ear exam shows no significant lesions or deformities.  Otoscopic examination reveals clear canals, tympanic membranes are intact bilaterally without bulging, retraction, inflammation or discharge. Hearing is grossly normal bilaterally. Neck:  No deformities, masses, or tenderness noted. Lungs:  normal respiratory effort, no crackles, and no wheezes.   Heart:  normal rate and regular rhythm.   Abdomen:  Bowel sounds  positive,abdomen soft and non-tender without masses, organomegaly or hernias noted. Msk:  lumbar lordosis and SI joint tenderness.   Neurologic:  alert & oriented X3 and DTRs symmetrical and normal.     Impression & Recommendations:  Problem # 1:  WEIGHT GAIN (ICD-783.1) need to exercize diet has improved and the lipids show this  Problem # 2:  HYPERLIPIDEMIA (ICD-272.4) working well Her updated medication list for this problem includes:    Livalo 4 Mg Tabs (Pitavastatin calcium) ..... One by mouth weekly  Labs Reviewed: SGOT: 16 (10/31/2010)   SGPT: 9 (10/31/2010)  Lipid Goals: Chol Goal: 200 (04/18/2010)   HDL Goal: 40 (04/18/2010)   LDL Goal: 160 (04/18/2010)   TG Goal: 150 (04/18/2010)  Prior 10 Yr Risk Heart Disease: 3 % (04/18/2010)   HDL:52.40 (10/31/2010), 51.00 (07/19/2010)  LDL:103 (10/31/2010), 118 (06/07/2009)  Chol:185 (10/31/2010), 217 (07/19/2010)  Trig:150.0 (10/31/2010), 151.0 (07/19/2010)  Problem # 3:  BACK PAIN, CHRONIC (ICD-724.5) acute flair Discussed use of moist heat or ice, modified activities, medications, and stretching/strengthening exercises. Back care instructions given. To be seen in 2 weeks if no improvement; sooner if worsening of symptoms.  Complete Medication List: 1)  Zoloft 50 Mg Tabs (Sertraline hcl) .... Take 1 tablet by mouth once a day 2)  Depakote 125 Mg Tbec (Divalproex sodium) .... Take 3 tablet by mouth at bedtime 3)  Yaz 3-0.02 Mg Tabs (Drospirenone-ethinyl estradiol) .... Take 1 tablet by mouth once a day 4)  Ambien 10 Mg Tabs (Zolpidem tartrate) .... As needed 5)  Krill Oil 1000 Mg Caps (Krill oil) .... One by mouth bid 6)  Niacin Flush Free 500 Mg Caps (Inositol niacinate) .... One by mouth daily witha low fat snak 7)  Lotrisone 1-0.05 % Lotn (Clotrimazole-betamethasone) .... Apply two times a day 8)  Livalo 4 Mg Tabs (Pitavastatin calcium) .... One by mouth weekly  Patient Instructions: 1)  It is important that you exercise  regularly at least 20 minutes 5 times a week. If you develop chest pain, have severe difficulty breathing, or feel very tired , stop exercising immediately and seek medical attention. 2)  Please schedule a follow-up appointment in 3 months.  weight check   Orders Added: 1)  Est. Patient Level IV [16109]

## 2011-01-17 ENCOUNTER — Telehealth: Payer: Self-pay | Admitting: Internal Medicine

## 2011-01-17 NOTE — Telephone Encounter (Signed)
Didn't call pt-- when I called her back she was in a meeting- l eft her a message to call and leave message with triage nurse

## 2011-01-17 NOTE — Telephone Encounter (Signed)
Pt returning call . pls call back

## 2011-02-09 ENCOUNTER — Ambulatory Visit: Payer: Self-pay | Admitting: Internal Medicine

## 2011-03-22 ENCOUNTER — Encounter: Payer: Self-pay | Admitting: Internal Medicine

## 2011-04-06 ENCOUNTER — Ambulatory Visit: Payer: Self-pay | Admitting: Internal Medicine

## 2011-04-10 ENCOUNTER — Ambulatory Visit (INDEPENDENT_AMBULATORY_CARE_PROVIDER_SITE_OTHER): Payer: BC Managed Care – PPO | Admitting: Internal Medicine

## 2011-04-10 DIAGNOSIS — I10 Essential (primary) hypertension: Secondary | ICD-10-CM

## 2011-04-10 DIAGNOSIS — D649 Anemia, unspecified: Secondary | ICD-10-CM

## 2011-04-10 DIAGNOSIS — R635 Abnormal weight gain: Secondary | ICD-10-CM

## 2011-04-10 NOTE — Progress Notes (Signed)
  Subjective:    Patient ID: Linda Kerr, female    DOB: 1961/09/12, 50 y.o.   MRN: [id number]  HPI  Weight gain Fatigue Not exercizing Blood pressure is good  Review of Systems     Objective:   Physical Exam        Assessment & Plan:

## 2011-07-20 ENCOUNTER — Ambulatory Visit: Payer: BC Managed Care – PPO | Admitting: Internal Medicine

## 2011-08-11 ENCOUNTER — Emergency Department (HOSPITAL_COMMUNITY)
Admission: EM | Admit: 2011-08-11 | Discharge: 2011-08-11 | Disposition: A | Discharge status: Home / Self Care | Payer: BC Managed Care – PPO | Attending: Emergency Medicine | Admitting: Emergency Medicine

## 2011-08-11 DIAGNOSIS — R6883 Chills (without fever): Secondary | ICD-10-CM | POA: Insufficient documentation

## 2011-08-11 DIAGNOSIS — R5381 Other malaise: Secondary | ICD-10-CM | POA: Insufficient documentation

## 2011-08-11 DIAGNOSIS — J029 Acute pharyngitis, unspecified: Secondary | ICD-10-CM | POA: Insufficient documentation

## 2011-08-11 DIAGNOSIS — Z79899 Other long term (current) drug therapy: Secondary | ICD-10-CM | POA: Insufficient documentation

## 2011-08-11 DIAGNOSIS — F3289 Other specified depressive episodes: Secondary | ICD-10-CM | POA: Insufficient documentation

## 2011-08-11 DIAGNOSIS — R599 Enlarged lymph nodes, unspecified: Secondary | ICD-10-CM | POA: Insufficient documentation

## 2011-08-11 DIAGNOSIS — F329 Major depressive disorder, single episode, unspecified: Secondary | ICD-10-CM | POA: Insufficient documentation

## 2011-08-11 DIAGNOSIS — R220 Localized swelling, mass and lump, head: Secondary | ICD-10-CM | POA: Insufficient documentation

## 2011-08-11 DIAGNOSIS — IMO0001 Reserved for inherently not codable concepts without codable children: Secondary | ICD-10-CM | POA: Insufficient documentation

## 2012-08-18 ENCOUNTER — Encounter: Payer: Self-pay | Admitting: Family

## 2012-08-18 ENCOUNTER — Telehealth: Payer: Self-pay | Admitting: Internal Medicine

## 2012-08-18 ENCOUNTER — Ambulatory Visit (INDEPENDENT_AMBULATORY_CARE_PROVIDER_SITE_OTHER): Payer: BC Managed Care – PPO | Admitting: Family

## 2012-08-18 VITALS — BP 110/70 | HR 87 | Temp 98.9°F | Wt 177.0 lb

## 2012-08-18 DIAGNOSIS — R300 Dysuria: Secondary | ICD-10-CM

## 2012-08-18 DIAGNOSIS — N390 Urinary tract infection, site not specified: Secondary | ICD-10-CM

## 2012-08-18 LAB — POCT URINALYSIS DIPSTICK
Bilirubin, UA: NEGATIVE
Glucose, UA: NEGATIVE
Ketones, UA: NEGATIVE
Nitrite, UA: NEGATIVE
Spec Grav, UA: 1.025
Urobilinogen, UA: 0.2
pH, UA: 6.5

## 2012-08-18 MED ORDER — SULFAMETHOXAZOLE-TRIMETHOPRIM 800-160 MG PO TABS: 1.0000 | ORAL_TABLET | Freq: Two times a day (BID) | ORAL | Status: AC

## 2012-08-18 NOTE — Telephone Encounter (Signed)
On BCP: irregular menses Caller: Linda Kerr/Patient; Patient Name: Linda Kerr; PCP: Darryll Capers (Adults only); Best Callback Phone Number: [phone] Call Reason: Started with frequency and burning with urination onset 08/16/12. Light pink with wiping but urine yellow. Voiding small amounts and having trouble holding urine.  Afebrile. C/O low back pain but has chronic pain. Triage and Care advice per Urinary Symptoms and appointment advised within 4 hours for "Urinary tract symptoms AND any flank or low back pain". Already Scheduled for 1220 -08/18/12.

## 2012-08-18 NOTE — Telephone Encounter (Signed)
Dr jenkins agrees 

## 2012-08-18 NOTE — Patient Instructions (Signed)
Urinary Tract Infection Urinary tract infections (UTIs) can develop anywhere along your urinary tract. Your urinary tract is your body's drainage system for removing wastes and extra water. Your urinary tract includes two kidneys, two ureters, a bladder, and a urethra. Your kidneys are a pair of bean-shaped organs. Each kidney is about the size of your fist. They are located below your ribs, one on each side of your spine. CAUSES Infections are caused by microbes, which are microscopic organisms, including fungi, viruses, and bacteria. These organisms are so small that they can only be seen through a microscope. Bacteria are the microbes that most commonly cause UTIs. SYMPTOMS  Symptoms of UTIs may vary by age and gender of the patient and by the location of the infection. Symptoms in young women typically include a frequent and intense urge to urinate and a painful, burning feeling in the bladder or urethra during urination. Older women and men are more likely to be tired, shaky, and weak and have muscle aches and abdominal pain. A fever may mean the infection is in your kidneys. Other symptoms of a kidney infection include pain in your back or sides below the ribs, nausea, and vomiting. DIAGNOSIS To diagnose a UTI, your caregiver will ask you about your symptoms. Your caregiver also will ask to provide a urine sample. The urine sample will be tested for bacteria and white blood cells. White blood cells are made by your body to help fight infection. TREATMENT  Typically, UTIs can be treated with medication. Because most UTIs are caused by a bacterial infection, they usually can be treated with the use of antibiotics. The choice of antibiotic and length of treatment depend on your symptoms and the type of bacteria causing your infection. HOME CARE INSTRUCTIONS  If you were prescribed antibiotics, take them exactly as your caregiver instructs you. Finish the medication even if you feel better after you  have only taken some of the medication.  Drink enough water and fluids to keep your urine clear or pale yellow.  Avoid caffeine, tea, and carbonated beverages. They tend to irritate your bladder.  Empty your bladder often. Avoid holding urine for long periods of time.  Empty your bladder before and after sexual intercourse.  After a bowel movement, women should cleanse from front to back. Use each tissue only once. SEEK MEDICAL CARE IF:   You have back pain.  You develop a fever.  Your symptoms do not begin to resolve within 3 days. SEEK IMMEDIATE MEDICAL CARE IF:   You have severe back pain or lower abdominal pain.  You develop chills.  You have nausea or vomiting.  You have continued burning or discomfort with urination. MAKE SURE YOU:   Understand these instructions.  Will watch your condition.  Will get help right away if you are not doing well or get worse. Document Released: 07/25/2005 Document Revised: 04/15/2012 Document Reviewed: 11/23/2011 Exit[employer] Patient Information 2013 [employer].  

## 2012-08-19 ENCOUNTER — Encounter: Payer: Self-pay | Admitting: Family

## 2012-08-19 NOTE — Progress Notes (Signed)
Subjective:    Patient ID: Linda Kerr, female    DOB: July 10, 1961, 51 y.o.   MRN: [id number]  HPI 51 year old AAF, nonsmoker, patient of Dr. Cato Mulligan is in today with c/o burning with urination, frequency and urgency that has worsened over the last 4 days. She has been more sexually active than usual. Has moderate caffeine intake.    Review of Systems  Constitutional: Negative.   Respiratory: Negative.   Cardiovascular: Negative.   Gastrointestinal: Negative.   Genitourinary: Positive for dysuria, urgency and frequency.  Musculoskeletal: Positive for back pain. Negative for myalgias, joint swelling and arthralgias.  Skin: Negative.   Neurological: Negative.   Hematological: Negative.   Psychiatric/Behavioral: Negative.    Past Medical History  Diagnosis Date  . Depression   . Hyperlipidemia     History   Social History  . Marital Status: Married    Spouse Name: N/A    Number of Children: N/A  . Years of Education: N/A   Occupational History  . Not on file.   Social History Main Topics  . Smoking status: Former Games developer  . Smokeless tobacco: Not on file  . Alcohol Use: Yes  . Drug Use: Not on file  . Sexually Active: Not on file   Other Topics Concern  . Not on file   Social History Narrative  . No narrative on file    Past Surgical History  Procedure Date  . Cesarean section   . Sigmoidoscopy     Family History  Problem Relation Age of Onset  . Hypertension    . Coronary artery disease    . Colon cancer    . Diabetes      Allergies  Allergen Reactions  . Penicillins     REACTION: unspecified    Current Outpatient Prescriptions on File Prior to Visit  Medication Sig Dispense Refill  . clotrimazole-betamethasone (LOTRISONE) lotion Apply topically 2 (two) times daily.        . divalproex (DEPAKOTE) 125 MG EC tablet Take by mouth. 3 tabs at bedtime       . drospirenone-ethinyl estradiol (YAZ) 3-0.02 MG per tablet Take 1 tablet by mouth daily.         . Inositol Niacinate (NIACIN FLUSH FREE) 500 MG CAPS Take by mouth daily.        Marland Kitchen KRILL OIL 1000 MG CAPS Take by mouth 2 (two) times daily.        . Pitavastatin Calcium (LIVALO) 4 MG TABS Take by mouth once a week.        . sertraline (ZOLOFT) 50 MG tablet Take 50 mg by mouth daily.        Marland Kitchen zolpidem (AMBIEN) 10 MG tablet Take 10 mg by mouth at bedtime as needed.          BP 110/70  Pulse 87  Temp 98.9 F (37.2 C) (Oral)  Wt 177 lb (80.287 kg)  SpO2 99%chart    Objective:   Physical Exam  Constitutional: She is oriented to person, place, and time. She appears well-developed and well-nourished.  Cardiovascular: Normal rate, regular rhythm and normal heart sounds.   Pulmonary/Chest: Effort normal and breath sounds normal.  Abdominal: Soft. Bowel sounds are normal. There is tenderness.       Tenderness over the bladder  Musculoskeletal:       No CVAT.  Neurological: She is alert and oriented to person, place, and time.  Skin: Skin is warm and dry.  Psychiatric: She  has a normal mood and affect.          Assessment & Plan:  Assessment: UTI-Uncontrolled, Dysuria-uncontrolled  Plan: Bactrim DS twice daily. Drink plenty of fluids. Avoid caffeine. Call the office if symptoms worsen or persist. Recheck as scheduled and sooner as needed.

## 2012-12-23 ENCOUNTER — Encounter: Payer: Self-pay | Admitting: Family Medicine

## 2012-12-23 ENCOUNTER — Ambulatory Visit (INDEPENDENT_AMBULATORY_CARE_PROVIDER_SITE_OTHER): Payer: BC Managed Care – PPO | Admitting: Family Medicine

## 2012-12-23 ENCOUNTER — Telehealth: Payer: Self-pay | Admitting: Internal Medicine

## 2012-12-23 VITALS — BP 122/82 | HR 80 | Temp 99.6°F | Wt 185.0 lb

## 2012-12-23 DIAGNOSIS — H8110 Benign paroxysmal vertigo, unspecified ear: Secondary | ICD-10-CM

## 2012-12-23 MED ORDER — PITAVASTATIN CALCIUM 4 MG PO TABS: ORAL_TABLET | ORAL | Status: DC

## 2012-12-23 MED ORDER — MECLIZINE HCL 25 MG PO TABS: 25.0000 mg | ORAL_TABLET | Freq: Three times a day (TID) | ORAL | Status: DC | PRN

## 2012-12-23 NOTE — Progress Notes (Signed)
Chief Complaint  Patient presents with  . Dizziness    HPI:  Acute visit for vertigo/dizzy: -started 2 days ago -room spins with certain movements of head to the R -denies fevers, chills, vision changes, syncope  -PMH depression - takes ambien, depakote and zolft  ROS: See pertinent positives and negatives per HPI.  Past Medical History  Diagnosis Date  . Depression   . Hyperlipidemia     Family History  Problem Relation Age of Onset  . Hypertension    . Coronary artery disease    . Colon cancer    . Diabetes      History   Social History  . Marital Status: Married    Spouse Name: N/A    Number of Children: N/A  . Years of Education: N/A   Social History Main Topics  . Smoking status: Former Games developer  . Smokeless tobacco: None  . Alcohol Use: Yes  . Drug Use: None  . Sexually Active: None   Other Topics Concern  . None   Social History Narrative  . None    Current outpatient prescriptions:clotrimazole-betamethasone (LOTRISONE) lotion, Apply topically 2 (two) times daily.  , Disp: , Rfl: ;  divalproex (DEPAKOTE) 125 MG EC tablet, Take by mouth. 3 tabs at bedtime , Disp: , Rfl: ;  drospirenone-ethinyl estradiol (YAZ) 3-0.02 MG per tablet, Take 1 tablet by mouth daily.  , Disp: , Rfl: ;  Inositol Niacinate (NIACIN FLUSH FREE) 500 MG CAPS, Take by mouth daily.  , Disp: , Rfl:  KRILL OIL 1000 MG CAPS, Take by mouth 2 (two) times daily.  , Disp: , Rfl: ;  Pitavastatin Calcium (LIVALO) 4 MG TABS, Take by mouth once a week.  , Disp: , Rfl: ;  sertraline (ZOLOFT) 50 MG tablet, Take 50 mg by mouth daily.  , Disp: , Rfl: ;  zolpidem (AMBIEN) 10 MG tablet, Take 10 mg by mouth at bedtime as needed.  , Disp: , Rfl:  meclizine (ANTIVERT) 25 MG tablet, Take 1 tablet (25 mg total) by mouth 3 (three) times daily as needed., Disp: 30 tablet, Rfl: 0  EXAM:  Filed Vitals:   12/23/12 1402  Pulse: 85  Temp: 99.6 F (37.6 C)    Body mass index is 33.28 kg/(m^2).  GENERAL:  vitals reviewed and listed above, alert, oriented, appears well hydrated and in no acute distress  HEENT: atraumatic, conjunttiva clear, no obvious abnormalities on inspection of external nose and ears, normal appearance of ear canals and TMs  NECK: no obvious masses on inspection  LUNGS: clear to auscultation bilaterally, no wheezes, rales or rhonchi, good air movement  CV: HRRR, no peripheral edema  MS: moves all extremities without noticeable abnormality  PSYCH: pleasant and cooperative, no obvious depression or anxiety  NEURO: CN II-XII grossly intact, PERRLA, finger to nose normal, + dix hallpike  ASSESSMENT AND PLAN:  Discussed the following assessment and plan:  BPPV (benign paroxysmal positional vertigo) - Plan: meclizine (ANTIVERT) 25 MG tablet, Ambulatory referral to Physical Therapy  -BPPV most likely, discussed other potential etiologies, follow up with PCP in 1-2 months if not resolved -Patient advised to return or notify a doctor immediately if symptoms worsen or persist or new concerns arise.  There are no Patient Instructions on file for this visit.   Kriste Basque R.

## 2012-12-23 NOTE — Patient Instructions (Signed)
Benign Positional Vertigo  Vertigo means you feel like you or your surroundings are moving when they are not. Benign positional vertigo is the most common form of vertigo. Benign means that the cause of your condition is not serious. Benign positional vertigo is more common in older adults.  CAUSES   Benign positional vertigo is the result of an upset in the labyrinth system. This is an area in the middle ear that helps control your balance. This may be caused by a viral infection, head injury, or repetitive motion. However, often no specific cause is found.  SYMPTOMS   Symptoms of benign positional vertigo occur when you move your head or eyes in different directions. Some of the symptoms may include:  · Loss of balance and falls.  · Vomiting.  · Blurred vision.  · Dizziness.  · Nausea.  · Involuntary eye movements (nystagmus).  DIAGNOSIS   Benign positional vertigo is usually diagnosed by physical exam. If the specific cause of your benign positional vertigo is unknown, your caregiver may perform imaging tests, such as magnetic resonance imaging (MRI) or computed tomography (CT).  TREATMENT   Your caregiver may recommend movements or procedures to correct the benign positional vertigo. Medicines such as meclizine, benzodiazepines, and medicines for nausea may be used to treat your symptoms. In rare cases, if your symptoms are caused by certain conditions that affect the inner ear, you may need surgery.  HOME CARE INSTRUCTIONS   · Follow your caregiver's instructions.  · Move slowly. Do not make sudden body or head movements.  · Avoid driving.  · Avoid operating heavy machinery.  · Avoid performing any tasks that would be dangerous to you or others during a vertigo episode.  · Drink enough fluids to keep your urine clear or pale yellow.  SEEK IMMEDIATE MEDICAL CARE IF:   · You develop problems with walking, weakness, numbness, or using your arms, hands, or legs.  · You have difficulty speaking.  · You develop  severe headaches.  · Your nausea or vomiting continues or gets worse.  · You develop visual changes.  · Your family or friends notice any behavioral changes.  · Your condition gets worse.  · You have a fever.  · You develop a stiff neck or sensitivity to light.  MAKE SURE YOU:   · Understand these instructions.  · Will watch your condition.  · Will get help right away if you are not doing well or get worse.  Document Released: 07/23/2006 Document Revised: 01/07/2012 Document Reviewed: 07/05/2011  Exit[employer]® Patient Information ©2013 [employer].

## 2012-12-23 NOTE — Telephone Encounter (Signed)
Patient Information:  Caller Name: Rhena  Phone: [phone]  Patient: Linda Kerr  Gender: Female  DOB: 14-May-1961  Age: 52 Years  PCP: Darryll Capers (Adults only)  Pregnant: No  Office Follow Up:  Does the office need to follow up with this patient?: No  Instructions For The Office: N/A  RN Note:  LMP- no menstrual cycles due to birth control. Patient states she developed episodes of dizziness, " room spinning", onset 12/21/12. Denies nausea or vomiting. Denies headache. Patient is taking fluids well. Care advice given per guidelines. Patient advised to change positions slowly. Patient advised not to drive self. Call back parameters reviewed. Patient verbalizes understanding. No appts. available, in Epic,  with Dr. Lovell Sheehan or Adline Mango. Appt. scheduled for 12/23/12 1400 with Dr. Selena Batten.  Symptoms  Reason For Call & Symptoms: Dizziness Hx: High Cholesterol, Bipolar Disorder Allergies: PCN Meds: Depakote, Zoloft, Ambien prn  Reviewed Health History In EMR: Yes  Reviewed Medications In EMR: Yes  Reviewed Allergies In EMR: Yes  Reviewed Surgeries / Procedures: Yes  Date of Onset of Symptoms: 12/21/2012 OB / GYN:  LMP: Unknown  Guideline(s) Used:  Dizziness  Disposition Per Guideline:   Discuss with PCP and Callback by Nurse Today  Reason For Disposition Reached:   Taking a medicine that could cause dizziness (e.g., blood pressure medications, diuretics)  Advice Given:  Temporary Dizziness  is usually a harmless symptom. It can be caused by not drinking enough water during sports or hot weather. It can also be caused by skipping a meal, too much sun exposure, standing up suddenly, standing too long in one place or even a viral illness.  Some Causes of Temporary Dizziness:  Standing Up Suddenly - Standing up suddenly (especially getting out of bed) or prolonged standing in one place are common causes of temporary dizziness. Not drinking enough fluids always makes it  worse. Certain medications can cause or increase this type of dizziness (e.g., blood pressure medications).  Drink Fluids:  Drink several glasses of fruit juice, other clear fluids, or water. This will improve hydration and blood glucose. If you have a fever or have had heat exposure, make sure the fluids are cold.  Stand Up Slowly:  Sit down or lie down if you feel dizzy.  Call Back If:  Still feel dizzy after 2 hours of rest and fluids  Passes out (faints)  You become worse.  RN Overrode Recommendation:  Make Appointment  .  Appointment Scheduled:  12/23/2012 14:00:00 Appointment Scheduled Provider:  Kriste Basque [facility])

## 2012-12-23 NOTE — Addendum Note (Signed)
Addended by: Azucena Freed on: 12/23/2012 02:36 PM   Modules accepted: Orders

## 2012-12-23 NOTE — Telephone Encounter (Signed)
For your review

## 2013-01-12 ENCOUNTER — Encounter (HOSPITAL_COMMUNITY): Payer: Self-pay | Admitting: Emergency Medicine

## 2013-01-12 ENCOUNTER — Emergency Department (HOSPITAL_COMMUNITY)
Admission: EM | Admit: 2013-01-12 | Discharge: 2013-01-12 | Disposition: A | Discharge status: Home / Self Care | Payer: BC Managed Care – PPO | Attending: Emergency Medicine | Admitting: Emergency Medicine

## 2013-01-12 ENCOUNTER — Emergency Department (HOSPITAL_COMMUNITY): Payer: BC Managed Care – PPO

## 2013-01-12 DIAGNOSIS — Y92410 Unspecified street and highway as the place of occurrence of the external cause: Secondary | ICD-10-CM | POA: Insufficient documentation

## 2013-01-12 DIAGNOSIS — Z87891 Personal history of nicotine dependence: Secondary | ICD-10-CM | POA: Insufficient documentation

## 2013-01-12 DIAGNOSIS — S139XXA Sprain of joints and ligaments of unspecified parts of neck, initial encounter: Secondary | ICD-10-CM | POA: Insufficient documentation

## 2013-01-12 DIAGNOSIS — Z79899 Other long term (current) drug therapy: Secondary | ICD-10-CM | POA: Insufficient documentation

## 2013-01-12 DIAGNOSIS — E785 Hyperlipidemia, unspecified: Secondary | ICD-10-CM | POA: Insufficient documentation

## 2013-01-12 DIAGNOSIS — F329 Major depressive disorder, single episode, unspecified: Secondary | ICD-10-CM | POA: Insufficient documentation

## 2013-01-12 DIAGNOSIS — S161XXA Strain of muscle, fascia and tendon at neck level, initial encounter: Secondary | ICD-10-CM

## 2013-01-12 DIAGNOSIS — S39012A Strain of muscle, fascia and tendon of lower back, initial encounter: Secondary | ICD-10-CM

## 2013-01-12 DIAGNOSIS — Y9389 Activity, other specified: Secondary | ICD-10-CM | POA: Insufficient documentation

## 2013-01-12 DIAGNOSIS — F3289 Other specified depressive episodes: Secondary | ICD-10-CM | POA: Insufficient documentation

## 2013-01-12 DIAGNOSIS — S335XXA Sprain of ligaments of lumbar spine, initial encounter: Secondary | ICD-10-CM | POA: Insufficient documentation

## 2013-01-12 MED ORDER — IBUPROFEN 600 MG PO TABS: 600.0000 mg | ORAL_TABLET | Freq: Four times a day (QID) | ORAL | Status: DC | PRN

## 2013-01-12 MED ORDER — CYCLOBENZAPRINE HCL 10 MG PO TABS: 10.0000 mg | ORAL_TABLET | Freq: Two times a day (BID) | ORAL | Status: DC | PRN

## 2013-01-12 MED ORDER — HYDROCODONE-ACETAMINOPHEN 5-325 MG PO TABS: 1.0000 | ORAL_TABLET | Freq: Four times a day (QID) | ORAL | Status: DC | PRN

## 2013-01-12 NOTE — ED Notes (Signed)
Report taken from Cindy R, RN  
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
## 2013-01-12 NOTE — ED Notes (Signed)
Pt taken to wiating room via wheelchair; pt alert and mentating appropriately upon d/c teaching; pt given d/c teaching and prescriptions; pt verbalizes understanding of d/c teaching and prescriptions; pt denies need for further questions; NAD noted upon d/c. Pt getting ride home with family.
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
## 2013-01-12 NOTE — ED Notes (Signed)
Pt ambulated in hall down to restroom down hall; pt denies dizziness and lightheadedness; pt alert and mentating appropriately; pt denies nausea and vomiting; NAD noted at this time

## 2013-01-12 NOTE — ED Notes (Signed)
PA at bedside.

## 2013-01-12 NOTE — ED Notes (Signed)
Family at bedside. 

## 2013-01-13 NOTE — ED Provider Notes (Signed)
Medical screening examination/treatment/procedure(s) were performed by non-physician practitioner and as supervising physician I was immediately available for consultation/collaboration.   Waldemar Siegel B. Desree Leap, MD 01/13/13 0705 

## 2013-01-20 ENCOUNTER — Ambulatory Visit: Payer: BC Managed Care – PPO | Admitting: Family

## 2013-01-27 ENCOUNTER — Ambulatory Visit: Payer: BC Managed Care – PPO | Admitting: Family

## 2013-02-20 ENCOUNTER — Ambulatory Visit (INDEPENDENT_AMBULATORY_CARE_PROVIDER_SITE_OTHER): Payer: BC Managed Care – PPO | Admitting: Family Medicine

## 2013-02-20 ENCOUNTER — Encounter: Payer: Self-pay | Admitting: Family Medicine

## 2013-02-20 VITALS — BP 130/84 | Temp 99.5°F | Wt 182.0 lb

## 2013-02-20 DIAGNOSIS — E669 Obesity, unspecified: Secondary | ICD-10-CM

## 2013-02-20 DIAGNOSIS — H8110 Benign paroxysmal vertigo, unspecified ear: Secondary | ICD-10-CM

## 2013-02-20 NOTE — Progress Notes (Signed)
Chief Complaint  Patient presents with  . 2 month follow up    HPI:  Follow up BPPV: -she feels much better -she has not had any more dizziness -she did not see PT for vestibular rehab because symptoms resolved on their own  Obesity: -wonders what to do about this -has exercise equipment at home - but doesn't use it  ROS: See pertinent positives and negatives per HPI.  Past Medical History  Diagnosis Date  . Depression   . Hyperlipidemia     Family History  Problem Relation Age of Onset  . Hypertension    . Coronary artery disease    . Colon cancer    . Diabetes      History   Social History  . Marital Status: Married    Spouse Name: N/A    Number of Children: N/A  . Years of Education: N/A   Social History Main Topics  . Smoking status: Former Games developer  . Smokeless tobacco: None  . Alcohol Use: Yes  . Drug Use: None  . Sexually Active: None   Other Topics Concern  . None   Social History Narrative  . None    Current outpatient prescriptions:cyclobenzaprine (FLEXERIL) 10 MG tablet, Take 1 tablet (10 mg total) by mouth 2 (two) times daily as needed for muscle spasms., Disp: 20 tablet, Rfl: 0;  divalproex (DEPAKOTE) 125 MG EC tablet, Take 375 mg by mouth at bedtime. , Disp: , Rfl: ;  drospirenone-ethinyl estradiol (YAZ) 3-0.02 MG per tablet, Take 1 tablet by mouth at bedtime. , Disp: , Rfl:  HYDROcodone-acetaminophen (NORCO) 5-325 MG per tablet, Take 1 tablet by mouth every 6 (six) hours as needed for pain., Disp: 20 tablet, Rfl: 0;  ibuprofen (ADVIL,MOTRIN) 600 MG tablet, Take 1 tablet (600 mg total) by mouth every 6 (six) hours as needed for pain., Disp: 30 tablet, Rfl: 0;  meclizine (ANTIVERT) 25 MG tablet, Take 25 mg by mouth 3 (three) times daily as needed for nausea., Disp: , Rfl:  Pitavastatin Calcium (LIVALO) 4 MG TABS, Take 4 mg by mouth once a week. On Wednesday, Disp: , Rfl: ;  sertraline (ZOLOFT) 50 MG tablet, Take 50 mg by mouth at bedtime. , Disp: ,  Rfl: ;  zolpidem (AMBIEN) 10 MG tablet, Take 10 mg by mouth at bedtime as needed for sleep. , Disp: , Rfl:   EXAM:  Filed Vitals:   02/20/13 1533  BP: 130/84  Temp: 99.5 F (37.5 C)    Body mass index is 33.28 kg/(m^2).  GENERAL: vitals reviewed and listed above, alert, oriented, appears well hydrated and in no acute distress  HEENT: atraumatic, conjunttiva clear, no obvious abnormalities on inspection of external nose and ears  NECK: no obvious masses on inspection  MS: moves all extremities without noticeable abnormality  PSYCH: pleasant and cooperative, no obvious depression or anxiety  ASSESSMENT AND PLAN:  Discussed the following assessment and plan:  BPPV (benign paroxysmal positional vertigo) -resolved  Obesity, unspecified -discussed management options including lifestyle changes and medical options -goals per interview to start regular CV exercise, small portions of healthy foods - low carb/no carb diet -she will follow up with PCP in 3 months  ->25 minutes spent face to face with this patient  -Patient advised to return or notify a doctor immediately if symptoms worsen or persist or new concerns arise.  There are no Patient Instructions on file for this visit.   Kriste Basque R.

## 2014-03-05 ENCOUNTER — Ambulatory Visit (INDEPENDENT_AMBULATORY_CARE_PROVIDER_SITE_OTHER): Payer: BC Managed Care – PPO | Admitting: Family

## 2014-03-05 ENCOUNTER — Encounter: Payer: Self-pay | Admitting: Family

## 2014-03-05 VITALS — BP 134/84 | HR 74 | Ht 62.0 in | Wt 183.9 lb

## 2014-03-05 DIAGNOSIS — E669 Obesity, unspecified: Secondary | ICD-10-CM

## 2014-03-05 DIAGNOSIS — R635 Abnormal weight gain: Secondary | ICD-10-CM

## 2014-03-05 LAB — CBC WITH DIFFERENTIAL/PLATELET
Basophils Absolute: 0.1 10*3/uL (ref 0.0–0.1)
Basophils Relative: 0.4 % (ref 0.0–3.0)
Eosinophils Absolute: 0.1 10*3/uL (ref 0.0–0.7)
Eosinophils Relative: 1 % (ref 0.0–5.0)
HCT: 40.2 % (ref 36.0–46.0)
Hemoglobin: 13.4 g/dL (ref 12.0–15.0)
Lymphocytes Relative: 58.8 % — ABNORMAL HIGH (ref 12.0–46.0)
Lymphs Abs: 7.7 10*3/uL — ABNORMAL HIGH (ref 0.7–4.0)
MCHC: 33.3 g/dL (ref 30.0–36.0)
MCV: 91.9 fl (ref 78.0–100.0)
Monocytes Absolute: 1.2 10*3/uL — ABNORMAL HIGH (ref 0.1–1.0)
Monocytes Relative: 9.3 % (ref 3.0–12.0)
Neutro Abs: 4 10*3/uL (ref 1.4–7.7)
Neutrophils Relative %: 30.5 % — ABNORMAL LOW (ref 43.0–77.0)
Platelets: 225 10*3/uL (ref 150.0–400.0)
RBC: 4.38 Mil/uL (ref 3.87–5.11)
RDW: 13.1 % (ref 11.5–15.5)
WBC: 13.1 10*3/uL — ABNORMAL HIGH (ref 4.0–10.5)

## 2014-03-05 LAB — COMPREHENSIVE METABOLIC PANEL
ALT: 29 U/L (ref 0–35)
AST: 32 U/L (ref 0–37)
Albumin: 3.9 g/dL (ref 3.5–5.2)
Alkaline Phosphatase: 60 U/L (ref 39–117)
BUN: 9 mg/dL (ref 6–23)
CO2: 28 mEq/L (ref 19–32)
Calcium: 9.8 mg/dL (ref 8.4–10.5)
Chloride: 104 mEq/L (ref 96–112)
Creatinine, Ser: 0.6 mg/dL (ref 0.4–1.2)
GFR: 133.09 mL/min (ref >60.00)
Glucose, Bld: 79 mg/dL (ref 70–99)
Potassium: 3.6 mEq/L (ref 3.5–5.1)
Sodium: 139 mEq/L (ref 135–145)
Total Bilirubin: 0.5 mg/dL (ref 0.2–1.2)
Total Protein: 7.2 g/dL (ref 6.0–8.3)

## 2014-03-05 LAB — TSH: TSH: 2.2 u[IU]/mL (ref 0.35–4.50)

## 2014-03-05 MED ORDER — PHENTERMINE HCL 37.5 MG PO CAPS: 37.5000 mg | ORAL_CAPSULE | ORAL | Status: DC

## 2014-03-05 NOTE — Progress Notes (Signed)
Pre visit review using our clinic review tool, if applicable. No additional management support is needed unless otherwise documented below in the visit note. 

## 2014-03-05 NOTE — Patient Instructions (Signed)

## 2014-03-05 NOTE — Progress Notes (Signed)
Subjective:    Patient ID: Kirby Funk, female    DOB: 1961/03/08, 53 y.o.   MRN: [id number]  HPI 53 year old [place] American female, nonsmoker who presents today with concerns of weight gain and obesity. She has a history of depression and hypercholesterolemia. Her goal is to lose 30-40 pounds. Before she tried to change her eating habits and exercises infrequently. His decreased her caffeine intake to approximately 2 sodas per week.   Review of Systems  Constitutional: Positive for fatigue and unexpected weight change.       Weight gain  HENT: Negative.   Respiratory: Negative.   Cardiovascular: Negative.   Gastrointestinal: Negative.   Endocrine: Negative.   Genitourinary: Negative.   Musculoskeletal: Negative.   Skin: Negative.   Neurological: Negative.   Hematological: Negative.   Psychiatric/Behavioral: Negative.    Past Medical History  Diagnosis Date  . Depression   . Hyperlipidemia     History   Social History  . Marital Status: Married    Spouse Name: N/A    Number of Children: N/A  . Years of Education: N/A   Occupational History  . Not on file.   Social History Main Topics  . Smoking status: Former Research scientist (life sciences)  . Smokeless tobacco: Not on file  . Alcohol Use: Yes  . Drug Use: Not on file  . Sexual Activity: Not on file   Other Topics Concern  . Not on file   Social History Narrative  . No narrative on file    Past Surgical History  Procedure Laterality Date  . Cesarean section    . Sigmoidoscopy      Family History  Problem Relation Age of Onset  . Hypertension    . Coronary artery disease    . Colon cancer    . Diabetes      Allergies  Allergen Reactions  . Fish Oil   . Penicillins     REACTION: unspecified    Current Outpatient Prescriptions on File Prior to Visit  Medication Sig Dispense Refill  . divalproex (DEPAKOTE) 125 MG EC tablet Take 375 mg by mouth at bedtime.       Marland Kitchen ibuprofen (ADVIL,MOTRIN) 600 MG tablet Take 1  tablet (600 mg total) by mouth every 6 (six) hours as needed for pain.  30 tablet  0  . sertraline (ZOLOFT) 50 MG tablet Take 50 mg by mouth at bedtime.       Marland Kitchen zolpidem (AMBIEN) 10 MG tablet Take 10 mg by mouth at bedtime as needed for sleep.       . Pitavastatin Calcium (LIVALO) 4 MG TABS Take 4 mg by mouth once a week. On Wednesday       No current facility-administered medications on file prior to visit.    BP 134/84  Pulse 74  Ht 5\' 2"  (1.575 m)  Wt 183 lb 14.4 oz (83.416 kg)  BMI 33.63 kg/m2  SpO2 99%chart     Objective:   Physical Exam  Constitutional: She is oriented to person, place, and time. She appears well-developed and well-nourished.  Neck: Normal range of motion. Neck supple.  Cardiovascular: Normal rate, regular rhythm and normal heart sounds.   Pulmonary/Chest: Effort normal and breath sounds normal.  Musculoskeletal: Normal range of motion.  Neurological: She is alert and oriented to person, place, and time.  Skin: Skin is warm and dry.  Psychiatric: She has a normal mood and affect.          Assessment & Plan:  Catrena was seen today for obesity.  Diagnoses and associated orders for this visit:  Obesity, unspecified - CMP - TSH - CBC with Differential  Weight gain - CMP - TSH - CBC with Differential  Other Orders - phentermine 37.5 MG capsule; Take 1 capsule (37.5 mg total) by mouth every morning.    complete physical exam at next office visit, fasting. Encouraged exercise daily.

## 2014-03-08 ENCOUNTER — Telehealth: Payer: Self-pay

## 2014-03-08 NOTE — Telephone Encounter (Signed)
Message copied by Santiago Bumpers on Mon Mar 08, 2014 11:48 AM ------      Message from: Roxy Cedar B      Created: Mon Mar 08, 2014  8:57 AM       Any history of elevated WBC? ------

## 2014-03-08 NOTE — Telephone Encounter (Signed)
Pt states she does not have a hx of elevated WBC but notes that she has been fighting off a virus. Recheck at next OV 04/15/14, per [facility]

## 2014-03-26 ENCOUNTER — Other Ambulatory Visit (HOSPITAL_COMMUNITY)
Admission: RE | Admit: 2014-03-26 | Discharge: 2014-03-26 | Disposition: A | Discharge status: Home / Self Care | Payer: BC Managed Care – PPO | Source: Ambulatory Visit | Attending: Physician Assistant | Admitting: Physician Assistant

## 2014-03-26 ENCOUNTER — Ambulatory Visit (INDEPENDENT_AMBULATORY_CARE_PROVIDER_SITE_OTHER): Payer: BC Managed Care – PPO | Admitting: Physician Assistant

## 2014-03-26 ENCOUNTER — Telehealth: Payer: Self-pay | Admitting: Internal Medicine

## 2014-03-26 VITALS — BP 140/80 | HR 88 | Temp 98.7°F | Resp 18 | Ht 62.0 in | Wt 181.0 lb

## 2014-03-26 DIAGNOSIS — N899 Noninflammatory disorder of vagina, unspecified: Secondary | ICD-10-CM

## 2014-03-26 DIAGNOSIS — R5381 Other malaise: Secondary | ICD-10-CM

## 2014-03-26 DIAGNOSIS — R5383 Other fatigue: Secondary | ICD-10-CM

## 2014-03-26 DIAGNOSIS — N760 Acute vaginitis: Secondary | ICD-10-CM | POA: Insufficient documentation

## 2014-03-26 DIAGNOSIS — R300 Dysuria: Secondary | ICD-10-CM

## 2014-03-26 DIAGNOSIS — N898 Other specified noninflammatory disorders of vagina: Secondary | ICD-10-CM

## 2014-03-26 LAB — POCT URINALYSIS DIPSTICK
Bilirubin, UA: NEGATIVE
Blood, UA: NEGATIVE
Glucose, UA: NEGATIVE
Ketones, UA: NEGATIVE
Leukocytes, UA: NEGATIVE
Nitrite, UA: NEGATIVE
Protein, UA: NEGATIVE
Spec Grav, UA: 1.02
Urobilinogen, UA: 1
pH, UA: 7

## 2014-03-26 LAB — POCT URINE PREGNANCY: PREG TEST UR: NEGATIVE

## 2014-03-26 NOTE — Telephone Encounter (Signed)
Patient Information:  Caller Name: Shataria  Phone: [phone]  Patient: Linda Kerr, Linda Kerr  Gender: Female  DOB: [birth year]  Age: 53 Years  PCP: Benay Pillow (Adults only)  Pregnant: No  Office Follow Up:  Does the office need to follow up with this patient?: No  Instructions For The Office: N/A  RN Note:  Patient calling with c/o urinary pain, pressure, and urgency.  Denies fever. Requesting an appointment after 15:00 due to work.  Symptoms  Reason For Call & Symptoms: pain with urination  Reviewed Health History In EMR: Yes  Reviewed Medications In EMR: Yes  Reviewed Allergies In EMR: Yes  Reviewed Surgeries / Procedures: Yes  Date of Onset of Symptoms: 03/24/2014 OB / GYN:  LMP: Unknown  Guideline(s) Used:  Urination Pain - Female  Disposition Per Guideline:   See Today in Office  Reason For Disposition Reached:   Painful urination AND EITHER frequency or urgency  Advice Given:  Call Back If:  You become worse.  Patient Will Follow Care Advice:  YES  Appointment Scheduled:  03/26/2014 15:30:11 Appointment Scheduled Provider:  Kela Millin

## 2014-03-26 NOTE — Progress Notes (Signed)
Pre visit review using our clinic review tool, if applicable. No additional management support is needed unless otherwise documented below in the visit note. 

## 2014-03-26 NOTE — Patient Instructions (Addendum)
We will call you with the results of your lab studies when they are available.  Monitor your symptoms, if they worsen or you develop a fever, he should seek medical attention.  Followup in approximately 2 weeks to reassess, or sooner if symptoms worsen or fail to improve.  Dysuria Dysuria is the medical term for pain with urination. There are many causes for dysuria, but urinary tract infection is the most common. If a urinalysis was performed it can show that there is a urinary tract infection. A urine culture confirms that you or your child is sick. You will need to follow up with a healthcare provider because:  If a urine culture was done you will need to know the culture results and treatment recommendations.  If the urine culture was positive, you or your child will need to be put on antibiotics or know if the antibiotics prescribed are the right antibiotics for your urinary tract infection.  If the urine culture is negative (no urinary tract infection), then other causes may need to be explored or antibiotics need to be stopped. Today laboratory work may have been done and there does not seem to be an infection. If cultures were done they will take at least 24 to 48 hours to be completed. Today x-rays may have been taken and they read as normal. No cause can be found for the problems. The x-rays may be re-read by a radiologist and you will be contacted if additional findings are made. You or your child may have been put on medications to help with this problem until you can see your primary caregiver. If the problems get better, see your primary caregiver if the problems return. If you were given antibiotics (medications which kill germs), take all of the mediations as directed for the full course of treatment.  If laboratory work was done, you need to find the results. Leave a telephone number where you can be reached. If this is not possible, make sure you find out how you are to get test  results. HOME CARE INSTRUCTIONS   Drink lots of fluids. For adults, drink eight, 8 ounce glasses of clear juice or water a day. For children, replace fluids as suggested by your caregiver.  Empty the bladder often. Avoid holding urine for long periods of time.  After a bowel movement, women should cleanse front to back, using each tissue only once.  Empty your bladder before and after sexual intercourse.  Take all the medicine given to you until it is gone. You may feel better in a few days, but TAKE ALL MEDICINE.  Avoid caffeine, tea, alcohol and carbonated beverages, because they tend to irritate the bladder.  In men, alcohol may irritate the prostate.  Only take over-the-counter or prescription medicines for pain, discomfort, or fever as directed by your caregiver.  If your caregiver has given you a follow-up appointment, it is very important to keep that appointment. Not keeping the appointment could result in a chronic or permanent injury, pain, and disability. If there is any problem keeping the appointment, you must call back to this facility for assistance. SEEK IMMEDIATE MEDICAL CARE IF:   Back pain develops.  A fever develops.  There is nausea (feeling sick to your stomach) or vomiting (throwing up).  Problems are no better with medications or are getting worse. MAKE SURE YOU:   Understand these instructions.  Will watch your condition.  Will get help right away if you are not doing well or  get worse. Document Released: 07/13/2004 Document Revised: 01/07/2012 Document Reviewed: 05/20/2008 [facility] Patient Information 2014 [place].

## 2014-03-27 ENCOUNTER — Encounter: Payer: Self-pay | Admitting: Physician Assistant

## 2014-03-27 NOTE — Progress Notes (Signed)
Subjective:    Patient ID: Linda Kerr, female    DOB: April 25, 1961, 53 y.o.   MRN: [id number]  Urinary Tract Infection    Pt is a 53 yo African American female presenting to clinic for Possible UTI. Pt states that yesterday she noticed that her urine started to smell like sulfur. She has not tried anything to relieve theses symptoms, but she has increased her water intake. She believes this has helped, as the smell is not a concentrated today. She denies frequency, urgency, and dysuria, but she states she has been experiencing vaginal burning and itching, which she thought was related to the UTI. She has had a yeast infection before, but more than a decade ago, and can't remember what her symptoms were then. Her only sexual contact for decades has been her husband, so she doubts she could have acquired an STI. She is currently on birth control by her OBGYN for hormones. She was recently switched from [place] to [place] about 3 months ago. She is unsure if this is related to her abnormal vaginal symptoms. She denies F/C/N/V/D/SOB/CP.   Review of Systems As per HPI and otherwise negative.   Past Medical History  Diagnosis Date  . Depression   . Hyperlipidemia    Past Surgical History  Procedure Laterality Date  . Cesarean section    . Sigmoidoscopy      reports that she has quit smoking. She does not have any smokeless tobacco history on file. She reports that she drinks alcohol. Her drug history is not on file. family history includes Colon cancer in an other family member; Coronary artery disease in an other family member; Diabetes in an other family member; Hypertension in an other family member. Allergies  Allergen Reactions  . Fish Oil   . Penicillins     REACTION: unspecified       Objective:   Physical Exam  Nursing note and vitals reviewed. Constitutional: She is oriented to person, place, and time. She appears well-developed and well-nourished. No distress.  HENT:  Head:  Normocephalic and atraumatic.  Eyes: Conjunctivae and EOM are normal. Pupils are equal, round, and reactive to light.  Neck: Normal range of motion. Neck supple.  Cardiovascular: Normal rate, regular rhythm, normal heart sounds and intact distal pulses.  Exam reveals no gallop and no friction rub.   No murmur heard. Pulmonary/Chest: Effort normal and breath sounds normal. No respiratory distress. She has no wheezes. She has no rales. She exhibits no tenderness.  Genitourinary: Vagina normal and uterus normal. No vaginal discharge found.  Musculoskeletal: Normal range of motion.  Neurological: She is alert and oriented to person, place, and time.  Skin: Skin is warm and dry. No rash noted. She is not diaphoretic. No erythema. No pallor.  Psychiatric: She has a normal mood and affect. Her behavior is normal. Judgment and thought content normal.   Filed Vitals:   03/26/14 1525  BP: 140/80  Pulse: 88  Temp: 98.7 F (37.1 C)  Resp: 18   Lab Results  Component Value Date   WBC 13.1* 03/05/2014   HGB 13.4 03/05/2014   HCT 40.2 03/05/2014   PLT 225.0 03/05/2014   GLUCOSE 79 03/05/2014   CHOL 185 10/31/2010   TRIG 150.0* 10/31/2010   HDL 52.40 10/31/2010   LDLDIRECT 145.4 07/19/2010   LDLCALC 103* 10/31/2010   ALT 29 03/05/2014   AST 32 03/05/2014   NA 139 03/05/2014   K 3.6 03/05/2014   CL 104 03/05/2014  CREATININE 0.6 03/05/2014   BUN 9 03/05/2014   CO2 28 03/05/2014   TSH 2.20 03/05/2014   Urinalysis Component     Latest Ref Rng 03/26/2014  Color, UA      yellow  Clarity, UA      clear  Glucose      n  Bilirubin, UA      n  Ketones, UA      n  Specific Gravity, UA      1.020  RBC, UA      n  pH, UA      7.0  Protein, UA      n  Urobilinogen, UA      1.0  Nitrite, UA      n  Leukocytes, UA      Negative       Assessment & Plan:  Naleah was seen today for urinary tract infection.  Diagnoses and associated orders for this visit:  Dysuria - POCT Urinalysis Dipstick - Urine  culture  Other malaise and fatigue - POCT urine pregnancy- negative.  Vaginal irritation - Cervicovaginal ancillary only- Trich, clue cells, yeast.  Pt will monitor symptoms for now due to lack of clear cause of symptoms. No sign of infection.   Will notify pt of lab results when available, and any need for change in plan based on results.  Plan to follow up in about 2 weeks to reassess.  Patient Instructions  We will call you with the results of your lab studies when they are available.  Monitor your symptoms, if they worsen or you develop a fever, he should seek medical attention.  Followup in approximately 2 weeks to reassess, or sooner if symptoms worsen or fail to improve.

## 2014-03-29 ENCOUNTER — Telehealth: Payer: Self-pay | Admitting: Physician Assistant

## 2014-03-29 DIAGNOSIS — N390 Urinary tract infection, site not specified: Secondary | ICD-10-CM

## 2014-03-29 LAB — URINE CULTURE: Colony Count: >=100000

## 2014-03-29 MED ORDER — CIPROFLOXACIN HCL 500 MG PO TABS: 500.0000 mg | ORAL_TABLET | Freq: Two times a day (BID) | ORAL | Status: DC

## 2014-03-29 NOTE — Telephone Encounter (Signed)
Work note faxed

## 2014-03-29 NOTE — Telephone Encounter (Signed)
Patient requesting work note from Friday to be faxed to job at [phone] attn Frady Menter.

## 2014-03-29 NOTE — Telephone Encounter (Signed)
Called pt. Will send in antibiotic to pharmacy to treat UTI. Pt will follow up as needed if symptoms worsen or persist despite treatment.

## 2014-03-29 NOTE — Telephone Encounter (Signed)
Confirmation received.

## 2014-04-01 MED ORDER — METRONIDAZOLE 500 MG PO TABS: 500.0000 mg | ORAL_TABLET | Freq: Two times a day (BID) | ORAL | Status: DC

## 2014-04-01 NOTE — Addendum Note (Signed)
Addended by: Colleen Can on: 04/01/2014 04:56 PM   Modules accepted: Orders

## 2014-04-08 ENCOUNTER — Telehealth: Payer: Self-pay | Admitting: Internal Medicine

## 2014-04-08 NOTE — Telephone Encounter (Signed)
Ok to fill 

## 2014-04-08 NOTE — Telephone Encounter (Signed)
Pt is needing new rx phentermine 37.5 mg, pt states everything is going well and she has not had any problems. Send to [facility].

## 2014-04-09 NOTE — Telephone Encounter (Signed)
Refill declined. Please call pt to schedule weight check.

## 2014-04-09 NOTE — Telephone Encounter (Signed)
No, she must weigh in.

## 2014-04-15 ENCOUNTER — Encounter: Payer: Self-pay | Admitting: Family

## 2014-04-15 ENCOUNTER — Ambulatory Visit (INDEPENDENT_AMBULATORY_CARE_PROVIDER_SITE_OTHER): Payer: BC Managed Care – PPO | Admitting: Family

## 2014-04-15 VITALS — BP 122/82 | HR 74 | Ht 62.5 in | Wt 179.9 lb

## 2014-04-15 DIAGNOSIS — F319 Bipolar disorder, unspecified: Secondary | ICD-10-CM

## 2014-04-15 DIAGNOSIS — F3289 Other specified depressive episodes: Secondary | ICD-10-CM

## 2014-04-15 DIAGNOSIS — F32A Depression, unspecified: Secondary | ICD-10-CM

## 2014-04-15 DIAGNOSIS — Z79899 Other long term (current) drug therapy: Secondary | ICD-10-CM

## 2014-04-15 DIAGNOSIS — F329 Major depressive disorder, single episode, unspecified: Secondary | ICD-10-CM

## 2014-04-15 DIAGNOSIS — E785 Hyperlipidemia, unspecified: Secondary | ICD-10-CM

## 2014-04-15 DIAGNOSIS — Z0000 Encounter for general adult medical examination without abnormal findings: Secondary | ICD-10-CM

## 2014-04-15 LAB — CBC WITH DIFFERENTIAL/PLATELET
Basophils Absolute: 0 10*3/uL (ref 0.0–0.1)
Basophils Relative: 0.5 % (ref 0.0–3.0)
EOS PCT: 1 % (ref 0.0–5.0)
Eosinophils Absolute: 0.1 10*3/uL (ref 0.0–0.7)
HEMATOCRIT: 39.9 % (ref 36.0–46.0)
Hemoglobin: 13.2 g/dL (ref 12.0–15.0)
LYMPHS ABS: 5.3 10*3/uL — AB (ref 0.7–4.0)
Lymphocytes Relative: 53.8 % — ABNORMAL HIGH (ref 12.0–46.0)
MCHC: 33.1 g/dL (ref 30.0–36.0)
MCV: 91.4 fl (ref 78.0–100.0)
MONOS PCT: 9.9 % (ref 3.0–12.0)
Monocytes Absolute: 1 10*3/uL (ref 0.1–1.0)
NEUTROS ABS: 3.4 10*3/uL (ref 1.4–7.7)
Neutrophils Relative %: 34.8 % — ABNORMAL LOW (ref 43.0–77.0)
Platelets: 195 10*3/uL (ref 150.0–400.0)
RBC: 4.37 Mil/uL (ref 3.87–5.11)
RDW: 13.4 % (ref 11.5–15.5)
WBC: 9.9 10*3/uL (ref 4.0–10.5)

## 2014-04-15 LAB — LIPID PANEL
Cholesterol: 184 mg/dL (ref 0–200)
HDL: 41 mg/dL (ref >39.00)
LDL CALC: 126 mg/dL — AB (ref 0–99)
NONHDL: 143
Total CHOL/HDL Ratio: 4
Triglycerides: 83 mg/dL (ref 0.0–149.0)
VLDL: 16.6 mg/dL (ref 0.0–40.0)

## 2014-04-15 MED ORDER — PITAVASTATIN CALCIUM 4 MG PO TABS: 4.0000 mg | ORAL_TABLET | ORAL | Status: DC

## 2014-04-15 MED ORDER — PHENTERMINE HCL 37.5 MG PO CAPS: 37.5000 mg | ORAL_CAPSULE | ORAL | Status: DC

## 2014-04-15 NOTE — Patient Instructions (Signed)
Exercise to Stay Healthy Exercise helps you become and stay healthy. EXERCISE IDEAS AND TIPS Choose exercises that:  You enjoy.  Fit into your day. You do not need to exercise really hard to be healthy. You can do exercises at a slow or medium level and stay healthy. You can:  Stretch before and after working out.  Try yoga, Pilates, or tai chi.  Lift weights.  Walk fast, swim, jog, run, climb stairs, bicycle, dance, or rollerskate.  Take aerobic classes. Exercises that burn about 150 calories:  Running 1  miles in 15 minutes.  Playing volleyball for 45 to 60 minutes.  Washing and waxing a car for 45 to 60 minutes.  Playing touch football for 45 minutes.  Walking 1  miles in 35 minutes.  Pushing a stroller 1  miles in 30 minutes.  Playing basketball for 30 minutes.  Raking leaves for 30 minutes.  Bicycling 5 miles in 30 minutes.  Walking 2 miles in 30 minutes.  Dancing for 30 minutes.  Shoveling snow for 15 minutes.  Swimming laps for 20 minutes.  Walking up stairs for 15 minutes.  Bicycling 4 miles in 15 minutes.  Gardening for 30 to 45 minutes.  Jumping rope for 15 minutes.  Washing windows or floors for 45 to 60 minutes. Document Released: 11/17/2010 Document Revised: 01/07/2012 Document Reviewed: 11/17/2010 Exit[employer] Patient Information 2015 [employer]. This information is not intended to replace advice given to you by your health care provider. Make sure you discuss any questions you have with your health care provider.  

## 2014-04-15 NOTE — Progress Notes (Signed)
Subjective:    Patient ID: Linda Kerr, female    DOB: Apr 10, 1961, 53 y.o.   MRN: [id number]  HPI 53 year old AAF, nonsmoker,  This is a routine wellness  examination for this patient . I reviewed all health maintenance protocols including mammography, colonoscopy, bone density Needed referrals were placed. Age and diagnosis  appropriate screening labs were ordered. Her immunization history was reviewed and appropriate vaccinations were ordered. Her current medications and allergies were reviewed and needed refills of her chronic medications were ordered. The plan for yearly health maintenance was discussed all orders and referrals were made as appropriate.   Review of Systems  Constitutional: Negative.   HENT: Negative.   Eyes: Negative.   Respiratory: Negative.   Cardiovascular: Negative.   Gastrointestinal: Negative.   Endocrine: Negative.   Genitourinary: Negative.   Musculoskeletal: Negative.   Skin: Negative.   Allergic/Immunologic: Negative.   Neurological: Negative.   Hematological: Negative.   Psychiatric/Behavioral: Negative.    Past Medical History  Diagnosis Date  . Depression   . Hyperlipidemia     History   Social History  . Marital Status: Married    Spouse Name: N/A    Number of Children: N/A  . Years of Education: N/A   Occupational History  . Not on file.   Social History Main Topics  . Smoking status: Former Research scientist (life sciences)  . Smokeless tobacco: Not on file  . Alcohol Use: Yes  . Drug Use: Not on file  . Sexual Activity: Not on file   Other Topics Concern  . Not on file   Social History Narrative  . No narrative on file    Past Surgical History  Procedure Laterality Date  . Cesarean section    . Sigmoidoscopy      Family History  Problem Relation Age of Onset  . Hypertension    . Coronary artery disease    . Colon cancer    . Diabetes      Allergies  Allergen Reactions  . Fish Oil   . Penicillins     REACTION: unspecified     Current Outpatient Prescriptions on File Prior to Visit  Medication Sig Dispense Refill  . divalproex (DEPAKOTE) 125 MG EC tablet Take 375 mg by mouth at bedtime.       Marland Kitchen ibuprofen (ADVIL,MOTRIN) 600 MG tablet Take 1 tablet (600 mg total) by mouth every 6 (six) hours as needed for pain.  30 tablet  0  . metroNIDAZOLE (FLAGYL) 500 MG tablet Take 1 tablet (500 mg total) by mouth 2 (two) times daily.  14 tablet  0  . norethindrone-ethinyl estradiol (JINTELI) 1-5 MG-MCG TABS Take 1 tablet by mouth daily.      . sertraline (ZOLOFT) 50 MG tablet Take 50 mg by mouth at bedtime.       Marland Kitchen zolpidem (AMBIEN) 10 MG tablet Take 10 mg by mouth at bedtime as needed for sleep.        No current facility-administered medications on file prior to visit.    BP 122/82  Pulse 74  Ht 5' 2.5" (1.588 m)  Wt 179 lb 14.4 oz (81.602 kg)  BMI 32.36 kg/m2  SpO2 98%chart    Objective:   Physical Exam  Constitutional: She is oriented to person, place, and time. She appears well-developed and well-nourished.  HENT:  Head: Normocephalic and atraumatic.  Right Ear: External ear normal.  Left Ear: External ear normal.  Nose: Nose normal.  Mouth/Throat: Oropharynx is clear  and moist.  Eyes: Conjunctivae and EOM are normal. Pupils are equal, round, and reactive to light.  Neck: Normal range of motion. Neck supple. No thyromegaly present.  Cardiovascular: Normal rate, regular rhythm and normal heart sounds.   Pulmonary/Chest: Effort normal and breath sounds normal.  Abdominal: Soft. Bowel sounds are normal.  Musculoskeletal: Normal range of motion.  Neurological: She is alert and oriented to person, place, and time. She has normal reflexes.  Skin: Skin is warm and dry.  Psychiatric: She has a normal mood and affect.          Assessment & Plan:   Problem List Items Addressed This Visit   HYPERLIPIDEMIA   Relevant Medications      Pitavastatin Calcium (LIVALO) 4 MG TABS    Other Visit Diagnoses    Routine general medical examination at a health care facility    -  Primary    Relevant Orders       EKG 12-Lead (Completed)       CBC with Differential       Lipid Panel       Ambulatory referral to Gastroenterology    Depression        Bipolar disorder, unspecified        Relevant Orders       Valproic Acid Level    High risk medication use          Encouraged healthy diet, exercise, weight reduction. Continue current medications. Recheck in 4 weeks to be sure she has reduced her weight. Encouraged exercise 45 minutes 4 days per week. Call the office with any questions or concerns. Admits that she has been off of medication for cholesterol

## 2014-04-15 NOTE — Progress Notes (Signed)
Pre visit review using our clinic review tool, if applicable. No additional management support is needed unless otherwise documented below in the visit note. 

## 2014-04-16 LAB — VALPROIC ACID LEVEL: Valproic Acid Lvl: 60.3 ug/mL (ref 50.0–100.0)

## 2014-04-21 ENCOUNTER — Encounter: Payer: Self-pay | Admitting: Internal Medicine

## 2014-04-22 NOTE — Telephone Encounter (Signed)
appt scheduled

## 2014-04-26 ENCOUNTER — Ambulatory Visit (AMBULATORY_SURGERY_CENTER): Payer: BC Managed Care – PPO | Admitting: *Deleted

## 2014-04-26 VITALS — Ht 62.5 in | Wt 176.6 lb

## 2014-04-26 DIAGNOSIS — Z1211 Encounter for screening for malignant neoplasm of colon: Secondary | ICD-10-CM

## 2014-04-26 MED ORDER — MOVIPREP 100 G PO SOLR: ORAL | Status: DC

## 2014-04-26 NOTE — Progress Notes (Signed)
No allergies to eggs or soy. No problems with anesthesia.  Pt given Emmi instructions for colonoscopy  No oxygen use  Takes Phentermine.  Stopped Friday   04/23/14

## 2014-04-28 ENCOUNTER — Encounter: Payer: Self-pay | Admitting: Internal Medicine

## 2014-05-03 ENCOUNTER — Encounter: Payer: Self-pay | Admitting: Internal Medicine

## 2014-05-03 ENCOUNTER — Ambulatory Visit (AMBULATORY_SURGERY_CENTER): Payer: BC Managed Care – PPO | Admitting: Internal Medicine

## 2014-05-03 VITALS — BP 136/86 | HR 65 | Temp 98.1°F | Resp 15 | Ht 62.5 in | Wt 176.0 lb

## 2014-05-03 DIAGNOSIS — Z1211 Encounter for screening for malignant neoplasm of colon: Secondary | ICD-10-CM

## 2014-05-03 MED ORDER — SODIUM CHLORIDE 0.9 % IV SOLN: 500.0000 mL | INTRAVENOUS | Status: DC

## 2014-05-03 NOTE — Progress Notes (Signed)
Procedure ends, to recovery, report given and VSS. 

## 2014-05-03 NOTE — Op Note (Signed)
[place]  [address] [place], [address]   COLONOSCOPY PROCEDURE REPORT  PATIENT: Linda Kerr, Linda Kerr  MR#: [id number] BIRTHDATE: July 07, 1961 , 52  yrs. old GENDER: Female ENDOSCOPIST: Jerene Bears, MD REFERRED ID:[id number] Megan Salon, FNP-BC PROCEDURE DATE:  05/03/2014 PROCEDURE:   Colonoscopy, screening First Screening Colonoscopy - Avg.  risk and is 50 yrs.  old or older Yes.  Prior Negative Screening - Now for repeat screening. N/A  History of Adenoma - Now for follow-up colonoscopy & has been > or = to 3 yrs.  N/A  Polyps Removed Today? No.  Recommend repeat exam, <10 yrs? No. ASA CLASS:   Class II INDICATIONS:average risk screening and first colonoscopy. MEDICATIONS: MAC sedation, administered by CRNA and propofol (Diprivan) 200mg  IV  DESCRIPTION OF PROCEDURE:   After the risks benefits and alternatives of the procedure were thoroughly explained, informed consent was obtained.  A digital rectal exam revealed no rectal mass.   The LB [id number]  endoscope was introduced through the anus and advanced to the cecum, which was identified by both the appendix and ileocecal valve. No adverse events experienced. The quality of the prep was good, using MoviPrep  The instrument was then slowly withdrawn as the colon was fully examined.   COLON FINDINGS: Mild diverticulosis was noted at the hepatic flexure.   The colon was otherwise normal.  There was no inflammation, polyps or cancers seen.  Retroflexed views revealed no abnormalities. The time to cecum=5 minutes 03 seconds. Withdrawal time=10 minutes 09 seconds.  The scope was withdrawn and the procedure completed.  COMPLICATIONS: There were no complications.  ENDOSCOPIC IMPRESSION: 1.   Mild diverticulosis was noted at the hepatic flexure 2.   The colon was otherwise normal  RECOMMENDATIONS: You should continue to follow colorectal cancer screening guidelines for "routine risk" patients with a repeat  colonoscopy in 10 years. There is no need for FOBT (stool) testing for at least 5 years.   eSigned:  Jerene Bears, MD 05/03/2014 9:01 AM   cc: The Patient; Roxy Cedar, FNP-BC

## 2014-05-03 NOTE — Patient Instructions (Signed)
YOU HAD AN ENDOSCOPIC PROCEDURE TODAY AT THE [facility]: Refer to the procedure report that was given to you for any specific questions about what was found during the examination.  If the procedure report does not answer your questions, please call your gastroenterologist to clarify.  If you requested that your care partner not be given the details of your procedure findings, then the procedure report has been included in a sealed envelope for you to review at your convenience later.  YOU SHOULD EXPECT: Some feelings of bloating in the abdomen. Passage of more gas than usual.  Walking can help get rid of the air that was put into your GI tract during the procedure and reduce the bloating. If you had a lower endoscopy (such as a colonoscopy or flexible sigmoidoscopy) you may notice spotting of blood in your stool or on the toilet paper. If you underwent a bowel prep for your procedure, then you may not have a normal bowel movement for a few days.  DIET: Your first meal following the procedure should be a light meal and then it is ok to progress to your normal diet.  A half-sandwich or bowl of soup is an example of a good first meal.  Heavy or fried foods are harder to digest and may make you feel nauseous or bloated.  Likewise meals heavy in dairy and vegetables can cause extra gas to form and this can also increase the bloating.  Drink plenty of fluids but you should avoid alcoholic beverages for 24 hours.  ACTIVITY: Your care partner should take you home directly after the procedure.  You should plan to take it easy, moving slowly for the rest of the day.  You can resume normal activity the day after the procedure however you should NOT DRIVE or use heavy machinery for 24 hours (because of the sedation medicines used during the test).    SYMPTOMS TO REPORT IMMEDIATELY: A gastroenterologist can be reached at any hour.  During normal business hours, 8:30 AM to 5:00 PM Monday through Friday,  call [phone].  After hours and on weekends, please call the GI answering service at [phone] who will take a message and have the physician on call contact you.   Following lower endoscopy (colonoscopy or flexible sigmoidoscopy):  Excessive amounts of blood in the stool  Significant tenderness or worsening of abdominal pains  Swelling of the abdomen that is new, acute  Fever of 100F or higher    FOLLOW UP: If any biopsies were taken you will be contacted by phone or by letter within the next 1-3 weeks.  Call your gastroenterologist if you have not heard about the biopsies in 3 weeks.  Our staff will call the home number listed on your records the next business day following your procedure to check on you and address any questions or concerns that you may have at that time regarding the information given to you following your procedure. This is a courtesy call and so if there is no answer at the home number and we have not heard from you through the emergency physician on call, we will assume that you have returned to your regular daily activities without incident.  SIGNATURES/CONFIDENTIALITY: You and/or your care partner have signed paperwork which will be entered into your electronic medical record.  These signatures attest to the fact that that the information above on your After Visit Summary has been reviewed and is understood.  Full responsibility of the confidentiality   of this discharge information lies with you and/or your care-partner.     

## 2014-05-04 ENCOUNTER — Telehealth: Payer: Self-pay

## 2014-05-04 NOTE — Telephone Encounter (Signed)
  Follow up Call-  Call back number 05/03/2014  Post procedure Call Back phone  # [phone]  Permission to leave phone message Yes     Patient questions:  Do you have a fever, pain , or abdominal swelling? No. Pain Score  0 *  Have you tolerated food without any problems? Yes.    Have you been able to return to your normal activities? Yes.    Do you have any questions about your discharge instructions: Diet   No. Medications  No. Follow up visit  No.  Do you have questions or concerns about your Care? No.  Actions: * If pain score is 4 or above: No action needed, pain <4.

## 2014-05-17 ENCOUNTER — Ambulatory Visit: Payer: BC Managed Care – PPO | Admitting: Family

## 2014-05-21 ENCOUNTER — Encounter: Payer: Self-pay | Admitting: Family

## 2014-05-21 ENCOUNTER — Ambulatory Visit (INDEPENDENT_AMBULATORY_CARE_PROVIDER_SITE_OTHER): Payer: BC Managed Care – PPO | Admitting: Family

## 2014-05-21 MED ORDER — PHENTERMINE HCL 37.5 MG PO CAPS: 37.5000 mg | ORAL_CAPSULE | ORAL | Status: DC

## 2014-05-21 NOTE — Patient Instructions (Signed)
Exercise to Stay Healthy Exercise helps you become and stay healthy. EXERCISE IDEAS AND TIPS Choose exercises that:  You enjoy.  Fit into your day. You do not need to exercise really hard to be healthy. You can do exercises at a slow or medium level and stay healthy. You can:  Stretch before and after working out.  Try yoga, Pilates, or tai chi.  Lift weights.  Walk fast, swim, jog, run, climb stairs, bicycle, dance, or rollerskate.  Take aerobic classes. Exercises that burn about 150 calories:  Running 1  miles in 15 minutes.  Playing volleyball for 45 to 60 minutes.  Washing and waxing a car for 45 to 60 minutes.  Playing touch football for 45 minutes.  Walking 1  miles in 35 minutes.  Pushing a stroller 1  miles in 30 minutes.  Playing basketball for 30 minutes.  Raking leaves for 30 minutes.  Bicycling 5 miles in 30 minutes.  Walking 2 miles in 30 minutes.  Dancing for 30 minutes.  Shoveling snow for 15 minutes.  Swimming laps for 20 minutes.  Walking up stairs for 15 minutes.  Bicycling 4 miles in 15 minutes.  Gardening for 30 to 45 minutes.  Jumping rope for 15 minutes.  Washing windows or floors for 45 to 60 minutes. Document Released: 11/17/2010 Document Revised: 01/07/2012 Document Reviewed: 11/17/2010 Exit[employer] Patient Information 2015 [employer]. This information is not intended to replace advice given to you by your health care provider. Make sure you discuss any questions you have with your health care provider.  

## 2014-05-21 NOTE — Progress Notes (Signed)
Pre visit review using our clinic review tool, if applicable. No additional management support is needed unless otherwise documented below in the visit note. 

## 2014-05-21 NOTE — Progress Notes (Signed)
Subjective:    Patient ID: Linda Kerr, female    DOB: [birth year], 53 y.o.   MRN: [id number]  HPI  53 year old African American female, nonsmoker therefore recheck of obesity. Currently taking phentermine 37.5 mg once a day. She is tolerating it well. She is not exercising.  Review of Systems  Constitutional: Negative.   Respiratory: Negative.   Cardiovascular: Negative.   Endocrine: Negative.   Musculoskeletal: Negative.   Allergic/Immunologic: Negative.   Neurological: Negative.   Psychiatric/Behavioral: Negative.    Past Medical History  Diagnosis Date  . Depression   . Hyperlipidemia   . Bipolar disorder   . Allergy   . Anemia   . Anxiety   . Arthritis     History   Social History  . Marital Status: Married    Spouse Name: N/A    Number of Children: N/A  . Years of Education: N/A   Occupational History  . Not on file.   Social History Main Topics  . Smoking status: Former Smoker    Quit date: 10/29/1988  . Smokeless tobacco: Never Used  . Alcohol Use: Yes     Comment: rare  . Drug Use: No  . Sexual Activity: Not on file   Other Topics Concern  . Not on file   Social History Narrative  . No narrative on file    Past Surgical History  Procedure Laterality Date  . Cesarean section    . Sigmoidoscopy      Family History  Problem Relation Age of Onset  . Hypertension    . Coronary artery disease    . Colon cancer    . Diabetes      Allergies  Allergen Reactions  . Fish Oil Rash  . Penicillins Rash    Current Outpatient Prescriptions on File Prior to Visit  Medication Sig Dispense Refill  . divalproex (DEPAKOTE) 125 MG EC tablet Take 375 mg by mouth at bedtime.       Marland Kitchen ibuprofen (ADVIL,MOTRIN) 600 MG tablet Take 1 tablet (600 mg total) by mouth every 6 (six) hours as needed for pain.  30 tablet  0  . norethindrone-ethinyl estradiol (JINTELI) 1-5 MG-MCG TABS Take 1 tablet by mouth daily.      . Pitavastatin Calcium (LIVALO) 4 MG TABS  Take 1 tablet (4 mg total) by mouth once a week. On Wednesday  30 tablet  4  . sertraline (ZOLOFT) 50 MG tablet Take 50 mg by mouth at bedtime.       Marland Kitchen zolpidem (AMBIEN) 10 MG tablet Take 10 mg by mouth at bedtime as needed for sleep.        No current facility-administered medications on file prior to visit.    BP 120/74  Pulse 98  Wt 173 lb (78.472 kg)chart    Objective:   Physical Exam  Constitutional: She is oriented to person, place, and time. She appears well-developed and well-nourished.  Neck: Normal range of motion. Neck supple.  Cardiovascular: Normal rate, regular rhythm and normal heart sounds.   Pulmonary/Chest: Effort normal and breath sounds normal.  Abdominal: Soft. Bowel sounds are normal.  Musculoskeletal: Normal range of motion.  Neurological: She is alert and oriented to person, place, and time.  Skin: Skin is warm and dry.  Psychiatric: She has a normal mood and affect.          Assessment & Plan:  Linda Kerr was seen today for weight check.  Diagnoses and associated orders for this visit:  Morbid obesity  Other Orders - phentermine 37.5 MG capsule; Take 1 capsule (37.5 mg total) by mouth every morning.   Call the office with any questions or concerns. Recheck in 4 weeks. If weight loss is not 8-10 pounds and exercising, consider discontinuing med.

## 2014-06-18 ENCOUNTER — Ambulatory Visit (INDEPENDENT_AMBULATORY_CARE_PROVIDER_SITE_OTHER): Payer: BC Managed Care – PPO | Admitting: Family

## 2014-06-18 ENCOUNTER — Encounter: Payer: Self-pay | Admitting: Family

## 2014-06-18 VITALS — BP 120/82 | HR 87 | Wt 170.8 lb

## 2014-06-18 DIAGNOSIS — E669 Obesity, unspecified: Secondary | ICD-10-CM

## 2014-06-18 MED ORDER — PHENTERMINE HCL 37.5 MG PO CAPS: 37.5000 mg | ORAL_CAPSULE | ORAL | Status: DC

## 2014-06-18 NOTE — Progress Notes (Signed)
Subjective:    Patient ID: Linda Kerr, female    DOB: 1961-07-09, 53 y.o.   MRN: [id number]  HPI 53 year old African American female nonsmoker in today for a recheck of obesity. Takes Phentermine that helps. Pain is down 3 pounds from the last OV and 13 pounds down overall.    Review of Systems  Constitutional: Negative.   Respiratory: Negative.   Cardiovascular: Negative.   Gastrointestinal: Negative.   Endocrine: Negative.   Genitourinary: Negative.   Musculoskeletal: Negative.   Skin: Negative.   Allergic/Immunologic: Negative.   Neurological: Negative.   Psychiatric/Behavioral: Negative.    Past Medical History  Diagnosis Date  . Depression   . Hyperlipidemia   . Bipolar disorder   . Allergy   . Anemia   . Anxiety   . Arthritis     History   Social History  . Marital Status: Married    Spouse Name: N/A    Number of Children: N/A  . Years of Education: N/A   Occupational History  . Not on file.   Social History Main Topics  . Smoking status: Former Smoker    Quit date: 10/29/1988  . Smokeless tobacco: Never Used  . Alcohol Use: Yes     Comment: rare  . Drug Use: No  . Sexual Activity: Not on file   Other Topics Concern  . Not on file   Social History Narrative  . No narrative on file    Past Surgical History  Procedure Laterality Date  . Cesarean section    . Sigmoidoscopy      Family History  Problem Relation Age of Onset  . Hypertension    . Coronary artery disease    . Colon cancer    . Diabetes      Allergies  Allergen Reactions  . Fish Oil Rash  . Penicillins Rash    Current Outpatient Prescriptions on File Prior to Visit  Medication Sig Dispense Refill  . divalproex (DEPAKOTE) 125 MG EC tablet Take 375 mg by mouth at bedtime.       Marland Kitchen ibuprofen (ADVIL,MOTRIN) 600 MG tablet Take 1 tablet (600 mg total) by mouth every 6 (six) hours as needed for pain.  30 tablet  0  . norethindrone-ethinyl estradiol (JINTELI) 1-5 MG-MCG  TABS Take 1 tablet by mouth daily.      . Pitavastatin Calcium (LIVALO) 4 MG TABS Take 1 tablet (4 mg total) by mouth once a week. On Wednesday  30 tablet  4  . sertraline (ZOLOFT) 50 MG tablet Take 50 mg by mouth at bedtime.       Marland Kitchen zolpidem (AMBIEN) 10 MG tablet Take 10 mg by mouth at bedtime as needed for sleep.        No current facility-administered medications on file prior to visit.    BP 120/82  Pulse 87  Wt 170 lb 12.8 oz (77.474 kg)chart     Objective:   Physical Exam  Constitutional: She is oriented to person, place, and time. She appears well-developed and well-nourished.  Neck: Normal range of motion. Neck supple. No thyromegaly present.  Cardiovascular: Normal rate, regular rhythm and normal heart sounds.   Pulmonary/Chest: Effort normal and breath sounds normal.  Musculoskeletal: Normal range of motion.  Neurological: She is alert and oriented to person, place, and time.  Skin: Skin is warm and dry.  Psychiatric: She has a normal mood and affect.          Assessment & Plan:  Feiga was seen today for weight check.  Diagnoses and associated orders for this visit:  Morbid obesity  Other Orders - phentermine 37.5 MG capsule; Take 1 capsule (37.5 mg total) by mouth every morning.   Call the office with any questions or concerns. Recheck in 4 weeks and sooner ad sooner as needed.

## 2014-06-18 NOTE — Progress Notes (Signed)
Pre visit review using our clinic review tool, if applicable. No additional management support is needed unless otherwise documented below in the visit note. 

## 2014-07-16 ENCOUNTER — Ambulatory Visit: Payer: BC Managed Care – PPO | Admitting: Family

## 2014-07-29 ENCOUNTER — Telehealth: Payer: Self-pay | Admitting: Family

## 2014-07-29 NOTE — Telephone Encounter (Signed)
Patient Information:  Caller Name: Talitha  Phone: [phone]  Patient: Linda Kerr  Gender: Female  DOB: 1961-08-15  Age: 53 Years  PCP: Roxy Cedar [facility])  Pregnant: No  Office Follow Up:  Does the office need to follow up with this patient?: No  Instructions For The Office: N/A   Symptoms  Reason For Call & Symptoms: Pt reports  itchy red bumps on scalp.and made worse after getting perm..  Reviewed Health History In EMR: Yes  Reviewed Medications In EMR: Yes  Reviewed Allergies In EMR: Yes  Reviewed Surgeries / Procedures: Yes  Date of Onset of Symptoms: 07/22/2014 OB / GYN:  LMP: Unknown  Guideline(s) Used:  Rash or Redness - Localized  Disposition Per Guideline:   See Within 3 Days in Office  Reason For Disposition Reached:   Localized rash present > 7 days  Advice Given:  Call Back If:  You become worse.  Patient Will Follow Care Advice:  YES  Appointment Scheduled:  07/30/2014 15:30:00 Appointment Scheduled Provider:  Roxy Cedar [facility])

## 2014-07-30 ENCOUNTER — Ambulatory Visit: Payer: BC Managed Care – PPO | Admitting: Family

## 2014-07-30 ENCOUNTER — Ambulatory Visit (INDEPENDENT_AMBULATORY_CARE_PROVIDER_SITE_OTHER): Payer: BC Managed Care – PPO | Admitting: Family

## 2014-07-30 ENCOUNTER — Encounter: Payer: Self-pay | Admitting: Family

## 2014-07-30 VITALS — BP 160/90 | Temp 99.0°F | Wt 172.0 lb

## 2014-07-30 DIAGNOSIS — E669 Obesity, unspecified: Secondary | ICD-10-CM

## 2014-07-30 DIAGNOSIS — L200 Besnier's prurigo: Secondary | ICD-10-CM

## 2014-07-30 DIAGNOSIS — T2015XA Burn of first degree of scalp [any part], initial encounter: Secondary | ICD-10-CM

## 2014-07-30 DIAGNOSIS — L239 Allergic contact dermatitis, unspecified cause: Secondary | ICD-10-CM

## 2014-07-30 MED ORDER — DERMA-SMOOTHE/FS BODY 0.01 % EX OIL: 1.0000 "application " | TOPICAL_OIL | Freq: Two times a day (BID) | CUTANEOUS | Status: DC

## 2014-07-30 MED ORDER — PHENTERMINE HCL 37.5 MG PO CAPS: 37.5000 mg | ORAL_CAPSULE | ORAL | Status: DC

## 2014-07-30 NOTE — Patient Instructions (Signed)

## 2014-08-01 ENCOUNTER — Encounter: Payer: Self-pay | Admitting: Family

## 2014-08-01 NOTE — Progress Notes (Signed)
Subjective:    Patient ID: Linda Kerr, female    DOB: Jul 12, 1961, 53 y.o.   MRN: [id number]  Rash   53 year old AAF, nonsmoker is in today with c/o a rash the the perimeter of her scalp. She reports eating oysters that caused her scalp to be very itchy. The next day, she had a relaxer applied that burned her scalp. She now has redness to the scalp and hair thinning. Has not been applying anything for relief.   Also taking Phentermine for obesity. She has been off the medication x 2 weeks. Is not exercising.    Review of Systems  Constitutional: Negative.   Respiratory: Negative.   Cardiovascular: Negative.   Gastrointestinal: Negative.   Endocrine: Negative.   Genitourinary: Negative.   Musculoskeletal: Negative.   Skin: Positive for rash.       Red, itchy scalp rash  Allergic/Immunologic:       Possible seafood allergy.   Neurological: Negative.   Hematological: Negative.   Psychiatric/Behavioral: Negative.    Past Medical History  Diagnosis Date  . Depression   . Hyperlipidemia   . Bipolar disorder   . Allergy   . Anemia   . Anxiety   . Arthritis     History   Social History  . Marital Status: Married    Spouse Name: N/A    Number of Children: N/A  . Years of Education: N/A   Occupational History  . Not on file.   Social History Main Topics  . Smoking status: Former Smoker    Quit date: 10/29/1988  . Smokeless tobacco: Never Used  . Alcohol Use: Yes     Comment: rare  . Drug Use: No  . Sexual Activity: Not on file   Other Topics Concern  . Not on file   Social History Narrative  . No narrative on file    Past Surgical History  Procedure Laterality Date  . Cesarean section    . Sigmoidoscopy      Family History  Problem Relation Age of Onset  . Hypertension    . Coronary artery disease    . Colon cancer    . Diabetes      Allergies  Allergen Reactions  . Fish Oil Rash  . Penicillins Rash    Current Outpatient Prescriptions on  File Prior to Visit  Medication Sig Dispense Refill  . divalproex (DEPAKOTE) 125 MG EC tablet Take 375 mg by mouth at bedtime.       Marland Kitchen ibuprofen (ADVIL,MOTRIN) 600 MG tablet Take 1 tablet (600 mg total) by mouth every 6 (six) hours as needed for pain.  30 tablet  0  . norethindrone-ethinyl estradiol (JINTELI) 1-5 MG-MCG TABS Take 1 tablet by mouth daily.      . Pitavastatin Calcium (LIVALO) 4 MG TABS Take 1 tablet (4 mg total) by mouth once a week. On Wednesday  30 tablet  4  . sertraline (ZOLOFT) 50 MG tablet Take 50 mg by mouth at bedtime.       Marland Kitchen zolpidem (AMBIEN) 10 MG tablet Take 10 mg by mouth at bedtime as needed for sleep.        No current facility-administered medications on file prior to visit.    BP 160/90  Temp(Src) 99 F (37.2 C) (Oral)  Wt 172 lb (78.019 kg)chart    Objective:   Physical Exam  Constitutional: She is oriented to person, place, and time. She appears well-developed and well-nourished.  HENT:  Right Ear: External ear normal.  Left Ear: External ear normal.  Nose: Nose normal.  Mouth/Throat: Oropharynx is clear and moist.  Neck: Normal range of motion. Neck supple.  Cardiovascular: Normal rate, regular rhythm and normal heart sounds.   Pulmonary/Chest: Effort normal and breath sounds normal.  Abdominal: Bowel sounds are normal.  Musculoskeletal: Normal range of motion.  Neurological: She is alert and oriented to person, place, and time.  Skin: Rash noted. There is erythema.  Red, pruritic rash/burn to the perimeter of the scalp. Mildly tender to touch.   Psychiatric: She has a normal mood and affect.          Assessment & Plan:  Molly was seen today for rash.  Diagnoses and associated orders for this visit:  Allergic dermatitis  Burn of scalp, first degree, initial encounter  Obesity  Other Orders - Fluocinolone Acetonide (DERMA-SMOOTHE/FS BODY) 0.01 % OIL; Apply 1 application topically 2 (two) times daily. - phentermine 37.5 MG capsule;  Take 1 capsule (37.5 mg total) by mouth every morning.    Advised this is the last month I will refill phentermine if she is not exercising AND loosing weight. The benefits are not outweighing the risk. Patient is pleading to have one month, I have agreed. Discontinue as discussed if no better. Do not relax hair. Refer to derm if no better.

## 2014-08-06 ENCOUNTER — Ambulatory Visit: Payer: BC Managed Care – PPO | Admitting: Family

## 2014-12-22 ENCOUNTER — Other Ambulatory Visit: Payer: Self-pay | Admitting: Obstetrics and Gynecology

## 2014-12-23 LAB — CYTOLOGY - PAP

## 2015-01-09 LAB — HM MAMMOGRAPHY: HM MAMMO: NORMAL

## 2015-05-10 ENCOUNTER — Telehealth: Payer: Self-pay | Admitting: Behavioral Health

## 2015-05-10 NOTE — Telephone Encounter (Signed)
Unable to reach patient at time of Pre-Visit Call.  Left message for patient to return call when available.    

## 2015-05-11 ENCOUNTER — Encounter: Payer: Self-pay | Admitting: Physician Assistant

## 2015-05-11 ENCOUNTER — Ambulatory Visit (INDEPENDENT_AMBULATORY_CARE_PROVIDER_SITE_OTHER): Payer: BC Managed Care – PPO | Admitting: Physician Assistant

## 2015-05-11 VITALS — BP 144/61 | HR 72 | Temp 98.5°F | Ht 62.0 in | Wt 184.0 lb

## 2015-05-11 DIAGNOSIS — IMO0001 Reserved for inherently not codable concepts without codable children: Secondary | ICD-10-CM

## 2015-05-11 DIAGNOSIS — Z299 Encounter for prophylactic measures, unspecified: Secondary | ICD-10-CM

## 2015-05-11 DIAGNOSIS — E785 Hyperlipidemia, unspecified: Secondary | ICD-10-CM

## 2015-05-11 DIAGNOSIS — R030 Elevated blood-pressure reading, without diagnosis of hypertension: Secondary | ICD-10-CM

## 2015-05-11 DIAGNOSIS — Z418 Encounter for other procedures for purposes other than remedying health state: Secondary | ICD-10-CM | POA: Diagnosis not present

## 2015-05-11 DIAGNOSIS — I10 Essential (primary) hypertension: Secondary | ICD-10-CM | POA: Insufficient documentation

## 2015-05-11 DIAGNOSIS — F3170 Bipolar disorder, currently in remission, most recent episode unspecified: Secondary | ICD-10-CM | POA: Insufficient documentation

## 2015-05-11 LAB — CBC
HCT: 40.6 % (ref 36.0–46.0)
Hemoglobin: 13.8 g/dL (ref 12.0–15.0)
MCHC: 33.9 g/dL (ref 30.0–36.0)
MCV: 91.6 fl (ref 78.0–100.0)
Platelets: 198 10*3/uL (ref 150.0–400.0)
RBC: 4.44 Mil/uL (ref 3.87–5.11)
RDW: 13.4 % (ref 11.5–15.5)
WBC: 10.1 10*3/uL (ref 4.0–10.5)

## 2015-05-11 LAB — LIPID PANEL
CHOL/HDL RATIO: 5
CHOLESTEROL: 212 mg/dL — AB (ref 0–200)
HDL: 46 mg/dL (ref >39.00)
LDL CALC: 151 mg/dL — AB (ref 0–99)
NONHDL: 166
Triglycerides: 74 mg/dL (ref 0.0–149.0)
VLDL: 14.8 mg/dL (ref 0.0–40.0)

## 2015-05-11 LAB — COMPREHENSIVE METABOLIC PANEL
ALK PHOS: 72 U/L (ref 39–117)
ALT: 14 U/L (ref 0–35)
AST: 17 U/L (ref 0–37)
Albumin: 4 g/dL (ref 3.5–5.2)
BUN: 11 mg/dL (ref 6–23)
CALCIUM: 9.5 mg/dL (ref 8.4–10.5)
CO2: 25 meq/L (ref 19–32)
CREATININE: 0.67 mg/dL (ref 0.40–1.20)
Chloride: 106 mEq/L (ref 96–112)
GFR: 118.88 mL/min (ref >60.00)
Glucose, Bld: 77 mg/dL (ref 70–99)
Potassium: 3.5 mEq/L (ref 3.5–5.1)
Sodium: 141 mEq/L (ref 135–145)
Total Bilirubin: 0.4 mg/dL (ref 0.2–1.2)
Total Protein: 7.5 g/dL (ref 6.0–8.3)

## 2015-05-11 LAB — URINALYSIS, ROUTINE W REFLEX MICROSCOPIC
Bilirubin Urine: NEGATIVE
Hgb urine dipstick: NEGATIVE
Ketones, ur: NEGATIVE
Leukocytes, UA: NEGATIVE
Nitrite: NEGATIVE
RBC / HPF: NONE SEEN (ref 0–?)
SPECIFIC GRAVITY, URINE: 1.025 (ref 1.000–1.030)
Total Protein, Urine: NEGATIVE
Urine Glucose: NEGATIVE
Urobilinogen, UA: 0.2 (ref 0.0–1.0)
WBC, UA: NONE SEEN (ref 0–?)
pH: 6 (ref 5.0–8.0)

## 2015-05-11 LAB — HEMOGLOBIN A1C: Hgb A1c MFr Bld: 5.6 % (ref 4.6–6.5)

## 2015-05-11 LAB — TSH: TSH: 2.13 u[IU]/mL (ref 0.35–4.50)

## 2015-05-11 MED ORDER — PITAVASTATIN CALCIUM 4 MG PO TABS: 4.0000 mg | ORAL_TABLET | Freq: Every day | ORAL | Status: DC

## 2015-05-11 MED ORDER — DIAZEPAM 5 MG PO TABS: 5.0000 mg | ORAL_TABLET | Freq: Two times a day (BID) | ORAL | Status: DC | PRN

## 2015-05-11 NOTE — Progress Notes (Signed)
Pre visit review using our clinic review tool, if applicable. No additional management support is needed unless otherwise documented below in the visit note. 

## 2015-05-11 NOTE — Assessment & Plan Note (Signed)
Suspect due to significant anxiety. Limit salt intake. Will reassess at follow-up.

## 2015-05-11 NOTE — Assessment & Plan Note (Signed)
Followed by Psychiatry Clovis Pu). Is stable on Depakote and Sertraline. Ambien previously used for sleep is causing sleep walking. Patient also anxious due to recent significant life stressors. Stop Ambien. Will Rx Valium 5 mg BID. Handout given for counseling services. Continue Depakote and Sertraline. Will check Depakote level, CMP, CBC and TSH today. Follow-up with [facility] as scheduled. Follow-up here in 1 month.

## 2015-05-11 NOTE — Patient Instructions (Signed)
Please go to the lab for blood work. I will call you with your results.  Please continue medications as directed with the following exception: Stop the Ambien. Start Valium twice daily for anxiety and sleep at night. Follow-up with Dr. Clovis Pu.  Follow-up with me in 1 month.

## 2015-05-11 NOTE — Progress Notes (Signed)
Patient presents to clinic today to transfer care from Dr. Arnoldo Morale who is retired.  Depression/Bipolar Disorder -- Depakote 750 mg at bedtime, Sertraline daily and Ambien PRN. Is followed by psychiatry (Dr. Clovis Pu).  Endorses doing well overall. Denies manic episode with current regimen.  Has been having some increased anxiety recently due to husband being laid off and significant home stressors. Is doing well overall but having some acute anxiety some of the time. Denies panic attack.   Hyperlipidemia -- Previously on Livalo but has not been on medication on while. Denies myalgias with medications.  Past Medical History  Diagnosis Date  . Depression   . Hyperlipidemia   . Bipolar disorder   . Allergy   . Anemia   . Anxiety   . Arthritis     Current Outpatient Prescriptions on File Prior to Visit  Medication Sig Dispense Refill  . norethindrone-ethinyl estradiol (JINTELI) 1-5 MG-MCG TABS Take 1 tablet by mouth daily.    . sertraline (ZOLOFT) 50 MG tablet Take 50 mg by mouth at bedtime.     Marland Kitchen zolpidem (AMBIEN) 10 MG tablet Take 10 mg by mouth at bedtime as needed for sleep.      No current facility-administered medications on file prior to visit.    Allergies  Allergen Reactions  . Fish Oil Rash  . Penicillins Rash    Family History  Problem Relation Age of Onset  . Hypertension    . Coronary artery disease    . Colon cancer    . Diabetes      History   Social History  . Marital Status: Married    Spouse Name: N/A  . Number of Children: 1  . 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 Smoking status: Former Smoker    Quit date: 10/29/1988  . Smokeless tobacco: Never Used  . Alcohol Use: 0.0 oz/week    0 Standard drinks or equivalent per week     Comment: rare  . Drug Use: No  . Sexual Activity: Yes   Other Topics Concern  . None   Social History Narrative   Review of Systems - See HPI.  All  other ROS are negative.  BP 144/61 mmHg  Pulse 72  Temp(Src) 98.5 F (36.9 C) (Oral)  Ht 5\' 2"  (1.575 m)  Wt 184 lb (83.462 kg)  BMI 33.65 kg/m2  SpO2 100%  Physical Exam  Constitutional: She is oriented to person, place, and time and well-developed, well-nourished, and in no distress.  HENT:  Head: Normocephalic and atraumatic.  Right Ear: External ear normal.  Left Ear: External ear normal.  Nose: Nose normal.  Mouth/Throat: Oropharynx is clear and moist.  Eyes: Conjunctivae are normal.  Neck: No thyromegaly present.  Cardiovascular: Normal rate, regular rhythm, normal heart sounds and intact distal pulses.   Pulmonary/Chest: Effort normal and breath sounds normal. No respiratory distress. She has no wheezes. She has no rales. She exhibits no tenderness.  Neurological: She is alert and oriented to person, place, and time.  Skin: Skin is warm and dry. No rash noted.  Psychiatric: Affect normal.  Vitals reviewed.  Assessment/Plan: Hyperlipidemia Will restart Livalo. Will obtain fasting lipid panel today.  Bipolar affective disorder in remission Followed by Psychiatry Clovis Pu). Is stable on Depakote and Sertraline. Ambien previously used for sleep is causing sleep walking. Patient also anxious due to recent significant life stressors. Stop Ambien. Will Rx Valium  5 mg BID. Handout given for counseling services. Continue Depakote and Sertraline. Will check Depakote level, CMP, CBC and TSH today. Follow-up with [facility] as scheduled. Follow-up here in 1 month.  Elevated BP Suspect due to significant anxiety. Limit salt intake. Will reassess at follow-up.

## 2015-05-11 NOTE — Assessment & Plan Note (Signed)
Will restart Livalo. Will obtain fasting lipid panel today.

## 2015-05-12 ENCOUNTER — Telehealth: Payer: Self-pay | Admitting: Behavioral Health

## 2015-05-12 LAB — VALPROIC ACID LEVEL: VALPROIC ACID LVL: 70.4 ug/mL (ref 50.0–100.0)

## 2015-05-12 NOTE — Telephone Encounter (Signed)
Caller: Self (Linda Kerr) Reason for call: Patient would like to know the results of her labs taken on yesterday.  Writer informed the patient of the results below, per the provider's notes.  Notes Recorded by Brunetta Jeans, PA-C on 05/11/2015 at 1:53 PM Labs look great overall. Cholesterol mildly elevated with LDL in 150s. Restart the Livalo daily. Will recheck lipids in 3 months. Depakote level pending. Will forward all results to specialists once they have all resulted.  Patient understood and did not have any further questions or concerns.

## 2015-05-13 ENCOUNTER — Telehealth: Payer: Self-pay | Admitting: *Deleted

## 2015-05-13 DIAGNOSIS — E785 Hyperlipidemia, unspecified: Secondary | ICD-10-CM

## 2015-05-13 NOTE — Telephone Encounter (Signed)
-----   Message from Brunetta Jeans, PA-C sent at 05/11/2015  1:53 PM EDT ----- Labs look great overall. Cholesterol mildly elevated with LDL in 150s. Restart the Livalo daily. Will recheck lipids in 3 months. Depakote level pending. Will forward all results to specialists once they have all resulted.

## 2015-05-13 NOTE — Telephone Encounter (Signed)
Called and spoke with the pt and informed her of recent lab results and note.  Pt verbalized understanding and agreed.  Pt will call back and schedule lab appt for 3 months.  Future lab ordered and sent.//AB/CMA

## 2015-06-15 ENCOUNTER — Ambulatory Visit (INDEPENDENT_AMBULATORY_CARE_PROVIDER_SITE_OTHER): Payer: BC Managed Care – PPO | Admitting: Physician Assistant

## 2015-06-15 VITALS — BP 144/76 | HR 82 | Temp 98.8°F | Resp 16 | Ht 62.0 in | Wt 185.8 lb

## 2015-06-15 DIAGNOSIS — S39012A Strain of muscle, fascia and tendon of lower back, initial encounter: Secondary | ICD-10-CM | POA: Diagnosis not present

## 2015-06-15 DIAGNOSIS — F418 Other specified anxiety disorders: Secondary | ICD-10-CM | POA: Diagnosis not present

## 2015-06-15 DIAGNOSIS — F329 Major depressive disorder, single episode, unspecified: Secondary | ICD-10-CM

## 2015-06-15 DIAGNOSIS — F419 Anxiety disorder, unspecified: Secondary | ICD-10-CM

## 2015-06-15 MED ORDER — NAPROXEN 500 MG PO TABS: 500.0000 mg | ORAL_TABLET | Freq: Two times a day (BID) | ORAL | Status: DC

## 2015-06-15 NOTE — Patient Instructions (Signed)
Please continue medications as directed. I am glad that you are feeling better! Please take the Naproxen twice daily with food as needed. Avoid heavy lifting or overexertion. Apply topical Aspercreme to lower back.  Call or return to clinic if symptoms are not continuing to resolve.

## 2015-06-15 NOTE — Progress Notes (Signed)
Pre visit review using our clinic review tool, if applicable. No additional management support is needed unless otherwise documented below in the visit note. 

## 2015-06-18 ENCOUNTER — Encounter: Payer: Self-pay | Admitting: Physician Assistant

## 2015-06-18 NOTE — Progress Notes (Signed)
Patient presents to clinic today for follow-up of anxiety. Endorses doing very well over the past few weeks as anxiety levels have reduced due to her husband getting his job back. Has only used Valium 2-3 times. Denies any use in the past 2 weeks. Is taking her Depakote and Zoloft as directed for Bipolar disorder. Denies labile mood or manic episodes.  Past Medical History  Diagnosis Date  . Depression   . Hyperlipidemia   . Bipolar disorder   . Allergy   . Anemia   . Anxiety   . Arthritis     Current Outpatient Prescriptions on File Prior to Visit  Medication Sig Dispense Refill  . diazepam (VALIUM) 5 MG tablet Take 1 tablet (5 mg total) by mouth every 12 (twelve) hours as needed for anxiety. 60 tablet 1  . divalproex (DEPAKOTE ER) 250 MG 24 hr tablet Take 750 mg by mouth at bedtime.  11  . norethindrone-ethinyl estradiol (JINTELI) 1-5 MG-MCG TABS Take 1 tablet by mouth daily.    . Pitavastatin Calcium (LIVALO) 4 MG TABS Take 1 tablet (4 mg total) by mouth daily. 30 tablet 4  . sertraline (ZOLOFT) 50 MG tablet Take 50 mg by mouth at bedtime.     Marland Kitchen zolpidem (AMBIEN) 10 MG tablet Take 10 mg by mouth at bedtime as needed for sleep.      No current facility-administered medications on file prior to visit.    Allergies  Allergen Reactions  . Fish Oil Rash  . Penicillins Rash    Family History  Problem Relation Age of Onset  . Hypertension    . Coronary artery disease    . Colon cancer    . Diabetes      Social History   Social History  . Marital Status: Married    Spouse Name: N/A  . Number of Children: 1  . 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 Smoking status: Former Smoker    Quit date: 10/29/1988  . Smokeless tobacco: Never Used  . Alcohol Use: 0.0 oz/week    0 Standard drinks or equivalent per week     Comment: rare  . Drug Use: No  . Sexual Activity: Yes   Other Topics Concern  . Not on  file   Social History Narrative    Review of Systems - See HPI.  All other ROS are negative.  BP 144/76 mmHg  Pulse 82  Temp(Src) 98.8 F (37.1 C) (Oral)  Resp 16  Ht 5\' 2"  (1.575 m)  Wt 185 lb 12.8 oz (84.278 kg)  BMI 33.97 kg/m2  SpO2 98%  Physical Exam  Recent Results (from the past 2160 hour(s))  CBC     Status: None   Collection Time: 05/11/15  9:15 AM  Result Value Ref Range   WBC 10.1 4.0 - 10.5 K/uL   RBC 4.44 3.87 - 5.11 Mil/uL   Platelets 198.0 150.0 - 400.0 K/uL   Hemoglobin 13.8 12.0 - 15.0 g/dL   HCT 05/13/15 55.0 - 01.6 %   MCV 91.6 78.0 - 100.0 fl   MCHC 33.9 30.0 - 36.0 g/dL   RDW 42.9 03.7 - 95.5 %  Comp Met (CMET)     Status: None   Collection Time: 05/11/15  9:15 AM  Result Value Ref Range   Sodium 141 135 - 145 mEq/L   Potassium 3.5 3.5 - 5.1 mEq/L   Chloride  106 96 - 112 mEq/L   CO2 25 19 - 32 mEq/L   Glucose, Bld 77 70 - 99 mg/dL   BUN 11 6 - 23 mg/dL   Creatinine, Ser 6.69 0.40 - 1.20 mg/dL   Total Bilirubin 0.4 0.2 - 1.2 mg/dL   Alkaline Phosphatase 72 39 - 117 U/L   AST 17 0 - 37 U/L   ALT 14 0 - 35 U/L   Total Protein 7.5 6.0 - 8.3 g/dL   Albumin 4.0 3.5 - 5.2 g/dL   Calcium 9.5 8.4 - 13.1 mg/dL   GFR 443.82 >29.14 mL/min  TSH     Status: None   Collection Time: 05/11/15  9:15 AM  Result Value Ref Range   TSH 2.13 0.35 - 4.50 uIU/mL  Lipid panel     Status: Abnormal   Collection Time: 05/11/15  9:15 AM  Result Value Ref Range   Cholesterol 212 (H) 0 - 200 mg/dL    Comment: ATP III Classification       Desirable:  < 200 mg/dL               Borderline High:  200 - 239 mg/dL          High:  > = 513 mg/dL   Triglycerides 19.6 0.0 - 149.0 mg/dL    Comment: Normal:  <833 mg/dLBorderline High:  150 - 199 mg/dL   HDL 89.51 >23.22 mg/dL   VLDL 65.7 0.0 - 76.6 mg/dL   LDL Cholesterol 598 (H) 0 - 99 mg/dL   Total CHOL/HDL Ratio 5     Comment:                Men          Women1/2 Average Risk     3.4          3.3Average Risk          5.0           4.42X Average Risk          9.6          7.13X Average Risk          15.0          11.0                       NonHDL 166.00     Comment: NOTE:  Non-HDL goal should be 30 mg/dL higher than patient's LDL goal (i.e. LDL goal of < 70 mg/dL, would have non-HDL goal of < 100 mg/dL)  Valproic Acid level     Status: None   Collection Time: 05/11/15  9:15 AM  Result Value Ref Range   Valproic Acid Lvl 70.4 50.0 - 100.0 ug/mL  Hemoglobin A1c     Status: None   Collection Time: 05/11/15  9:15 AM  Result Value Ref Range   Hgb A1c MFr Bld 5.6 4.6 - 6.5 %    Comment: Glycemic Control Guidelines for People with Diabetes:Non Diabetic:  <6%Goal of Therapy: <7%Additional Action Suggested:  >8%   Urinalysis, Routine w reflex microscopic     Status: Abnormal   Collection Time: 05/11/15  9:23 AM  Result Value Ref Range   Color, Urine YELLOW Yellow;Lt. Yellow   APPearance CLEAR Clear   Specific Gravity, Urine 1.025 1.000-1.030   pH 6.0 5.0 - 8.0   Total Protein, Urine NEGATIVE Negative   Urine Glucose NEGATIVE Negative   Ketones, ur NEGATIVE Negative  Bilirubin Urine NEGATIVE Negative   Hgb urine dipstick NEGATIVE Negative   Urobilinogen, UA 0.2 0.0 - 1.0   Leukocytes, UA NEGATIVE Negative   Nitrite NEGATIVE Negative   WBC, UA none seen 0-2/hpf   RBC / HPF none seen 0-2/hpf   Mucus, UA Presence of (A) None   Squamous Epithelial / LPF Few(5-10/hpf) (A) Rare(0-4/hpf)    Assessment/Plan: Anxiety and depression Situational -- resolving. Will continue Valium PRN for acute anxiety. Continue bipolar depression medications as directed. Follow-up 6 months.

## 2015-06-18 NOTE — Assessment & Plan Note (Signed)
Situational -- resolving. Will continue Valium PRN for acute anxiety. Continue bipolar depression medications as directed. Follow-up 6 months.

## 2015-10-15 ENCOUNTER — Other Ambulatory Visit: Payer: Self-pay | Admitting: Physician Assistant

## 2015-10-17 ENCOUNTER — Telehealth: Payer: Self-pay | Admitting: Physician Assistant

## 2015-10-17 NOTE — Telephone Encounter (Signed)
Left message for patient to call about flu shot °

## 2015-11-22 ENCOUNTER — Telehealth: Payer: Self-pay | Admitting: *Deleted

## 2015-11-22 NOTE — Telephone Encounter (Signed)
Received fax from pharmacy requesting PA for Livalo 4 mg tablets; Initiated via Cover my Meds, awaiting response/SLS 01/24

## 2015-12-16 ENCOUNTER — Ambulatory Visit (INDEPENDENT_AMBULATORY_CARE_PROVIDER_SITE_OTHER): Payer: BC Managed Care – PPO | Admitting: Physician Assistant

## 2015-12-16 ENCOUNTER — Encounter: Payer: Self-pay | Admitting: Physician Assistant

## 2015-12-16 VITALS — BP 138/80 | HR 72 | Temp 98.8°F | Ht 62.0 in | Wt 183.4 lb

## 2015-12-16 DIAGNOSIS — E785 Hyperlipidemia, unspecified: Secondary | ICD-10-CM | POA: Diagnosis not present

## 2015-12-16 LAB — LIPID PANEL
Cholesterol: 190 mg/dL (ref 125–200)
HDL: 48 mg/dL (ref >=46)
LDL Cholesterol: 126 mg/dL (ref <130)
Total CHOL/HDL Ratio: 4 Ratio (ref <=5.0)
Triglycerides: 80 mg/dL (ref <150)
VLDL: 16 mg/dL (ref <30)

## 2015-12-16 NOTE — Patient Instructions (Signed)
Please go to the lab for blood work. I will call you with your results. Continue your diet and exercise regimen.  We will follow-up in 6 months was for a physical.

## 2015-12-16 NOTE — Progress Notes (Signed)
Pre visit review using our clinic review tool, if applicable. No additional management support is needed unless otherwise documented below in the visit note. 

## 2015-12-18 NOTE — Progress Notes (Signed)
Patient presents to clinic today for follow-up of hyperlipidemia. Is taking Livalo as directed. Endorses watching diet and exercising when possible.   Past Medical History  Diagnosis Date  . Depression   . Hyperlipidemia   . Bipolar disorder ([place])   . Allergy   . Anemia   . Anxiety   . Arthritis     Current Outpatient Prescriptions on File Prior to Visit  Medication Sig Dispense Refill  . diazepam (VALIUM) 5 MG tablet Take 1 tablet (5 mg total) by mouth every 12 (twelve) hours as needed for anxiety. 60 tablet 1  . divalproex (DEPAKOTE ER) 250 MG 24 hr tablet Take 750 mg by mouth at bedtime.  11  . LIVALO 4 MG TABS TAKE 1 TABLET BY MOUTH EVERY DAY 30 tablet 5  . naproxen (NAPROSYN) 500 MG tablet Take 1 tablet (500 mg total) by mouth 2 (two) times daily with a meal. 60 tablet 0  . norethindrone-ethinyl estradiol (JINTELI) 1-5 MG-MCG TABS Take 1 tablet by mouth daily.    . sertraline (ZOLOFT) 50 MG tablet Take 50 mg by mouth at bedtime.     Marland Kitchen zolpidem (AMBIEN) 10 MG tablet Take 10 mg by mouth at bedtime as needed for sleep.      No current facility-administered medications on file prior to visit.    Allergies  Allergen Reactions  . Fish Oil Rash  . Penicillins Rash    Family History  Problem Relation Age of Onset  . Hypertension    . Coronary artery disease    . Colon cancer    . Diabetes      Social History   Social History  . Marital Status: Married    Spouse Name: N/A  . Number of Children: 1  . 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 Smoking status: Former Smoker    Quit date: 10/29/1988  . Smokeless tobacco: Never Used  . Alcohol Use: 0.0 oz/week    0 Standard drinks or equivalent per week     Comment: rare  . Drug Use: No  . Sexual Activity: Yes   Other Topics Concern  . None   Social History Narrative    Review of Systems - See HPI.  All other ROS are negative.  BP 138/80 mmHg   Pulse 72  Temp(Src) 98.8 F (37.1 C) (Oral)  Ht 5\' 2"  (1.575 m)  Wt 183 lb 6.4 oz (83.19 kg)  BMI 33.54 kg/m2  SpO2 100%  Physical Exam  Constitutional: She is oriented to person, place, and time and well-developed, well-nourished, and in no distress.  HENT:  Head: Normocephalic and atraumatic.  Cardiovascular: Normal rate, regular rhythm, normal heart sounds and intact distal pulses.   Pulmonary/Chest: Effort normal and breath sounds normal. No respiratory distress. She has no wheezes. She has no rales. She exhibits no tenderness.  Neurological: She is alert and oriented to person, place, and time.  Skin: Skin is warm and dry. No rash noted.  Vitals reviewed.   Recent Results (from the past 2160 hour(s))  Lipid Profile     Status: None   Collection Time: 12/16/15  4:54 PM  Result Value Ref Range   Cholesterol 190 125 - 200 mg/dL   Triglycerides 80 <150 mg/dL   HDL 48 >=46 mg/dL   Total CHOL/HDL Ratio 4.0 <=5.0 Ratio   VLDL 16 <30 mg/dL   LDL Cholesterol 126 <130 mg/dL  Comment:   Total Cholesterol/HDL Ratio:CHD Risk                        Coronary Heart Disease Risk Table                                        Men       Women          1/2 Average Risk              3.4        3.3              Average Risk              5.0        4.4           2X Average Risk              9.6        7.1           3X Average Risk             23.4       11.0 Use the calculated Patient Ratio above and the CHD Risk table  to determine the patient's CHD Risk.     Assessment/Plan: Hyperlipidemia Endorses taking medications as directed. Will repeat lipid panel today to further assess.

## 2015-12-18 NOTE — Assessment & Plan Note (Signed)
Endorses taking medications as directed. Will repeat lipid panel today to further assess.

## 2016-05-09 ENCOUNTER — Telehealth: Payer: Self-pay | Admitting: *Deleted

## 2016-05-09 NOTE — Telephone Encounter (Signed)
PA initiated on [email or web address] Key: [id number] - PA Case ID: [id number]  Awaiting determination   Available Formulary Alternatives: atorvastatin, fluvastatin, lovastatin, pravastatin, rosuvastatin, simvastatin, Vytorin

## 2016-05-11 NOTE — Telephone Encounter (Signed)
Pt states that she had a reaction to Lipitor She states she is willing to try Vytorin Please advise

## 2016-05-11 NOTE — Telephone Encounter (Signed)
What reaction did she have? If just mild like muscle aches, recommend we try the plain Simvastatin at 20 mg. The Vytorin is a combination of Simvastatin and Ezetimibe and I would prefer her be on one medication only if possible. If she had a severe reaction -- swelling, SOB, etc -- then we will need to try a different class of medication.

## 2016-05-11 NOTE — Telephone Encounter (Signed)
Appeal sent Will send Simvastatin is appeal is denied

## 2016-05-11 NOTE — Telephone Encounter (Signed)
Would try Simvastatin 20 mg -- ok to send in 1 month supply with 1 refill. FU 2 month so we can see how she is doing. If she notes any side effect, she is to call office or come see me immediately.

## 2016-05-11 NOTE — Telephone Encounter (Signed)
PA denied, please see alternatives listed below. Please advise.

## 2016-05-11 NOTE — Telephone Encounter (Signed)
I do not believe patient has been on another statin, but please call to verify with her.  Would recommend Lipitor 10 as alternate if she has never been on.

## 2016-05-11 NOTE — Telephone Encounter (Signed)
Pt wasn't sure of reaction. She stated "it made her feel funny"

## 2016-05-16 MED ORDER — SIMVASTATIN 20 MG PO TABS: 20.0000 mg | ORAL_TABLET | Freq: Every day | ORAL | Status: DC

## 2016-05-16 NOTE — Telephone Encounter (Signed)
Appeal denied Simvastatin e-scribed to pharmacy as noted below Pt aware

## 2016-05-16 NOTE — Addendum Note (Signed)
Addended by: Murtis Sink A on: 05/16/2016 10:14 AM   Modules accepted: Orders, Medications

## 2016-06-15 ENCOUNTER — Encounter: Payer: BC Managed Care – PPO | Admitting: Physician Assistant

## 2016-11-11 ENCOUNTER — Other Ambulatory Visit: Payer: Self-pay | Admitting: Physician Assistant

## 2016-11-27 ENCOUNTER — Encounter: Payer: Self-pay | Admitting: Physician Assistant

## 2016-11-27 ENCOUNTER — Ambulatory Visit (INDEPENDENT_AMBULATORY_CARE_PROVIDER_SITE_OTHER): Payer: BC Managed Care – PPO | Admitting: Physician Assistant

## 2016-11-27 VITALS — BP 124/86 | HR 67 | Temp 98.7°F | Resp 14 | Ht 62.0 in | Wt 188.0 lb

## 2016-11-27 DIAGNOSIS — J3089 Other allergic rhinitis: Secondary | ICD-10-CM | POA: Diagnosis not present

## 2016-11-27 MED ORDER — FLUTICASONE PROPIONATE 50 MCG/ACT NA SUSP: 2.0000 | Freq: Every day | NASAL | 1 refills | Status: DC

## 2016-11-27 MED ORDER — LEVOCETIRIZINE DIHYDROCHLORIDE 5 MG PO TABS: 5.0000 mg | ORAL_TABLET | Freq: Every evening | ORAL | 1 refills | Status: DC

## 2016-11-27 NOTE — Progress Notes (Signed)
Pre visit review using our clinic review tool, if applicable. No additional management support is needed unless otherwise documented below in the visit note. 

## 2016-11-27 NOTE — Progress Notes (Signed)
Patient presents to clinic today c/o 2 days of sinus pressurewith L ear pain. Denes sore throat, fever, chills, chest congestion or cough. Denies chest pain or SOB. Denies recent travel. Denies sick contact. Endorses L ear pressure. Also working in an environment where there may be mold. Has + history of allergies. Is not taken anything at present.   Past Medical History:  Diagnosis Date  . Allergy   . Anemia   . Anxiety   . Arthritis   . Bipolar disorder ([place])   . Depression   . Hyperlipidemia     Current Outpatient Prescriptions on File Prior to Visit  Medication Sig Dispense Refill  . diazepam (VALIUM) 5 MG tablet Take 1 tablet (5 mg total) by mouth every 12 (twelve) hours as needed for anxiety. 60 tablet 1  . divalproex (DEPAKOTE ER) 250 MG 24 hr tablet Take 750 mg by mouth at bedtime.  11  . norethindrone-ethinyl estradiol (JINTELI) 1-5 MG-MCG TABS Take 1 tablet by mouth daily.    . sertraline (ZOLOFT) 50 MG tablet Take 50 mg by mouth at bedtime.     Marland Kitchen zolpidem (AMBIEN) 10 MG tablet Take 10 mg by mouth at bedtime as needed for sleep.     . simvastatin (ZOCOR) 20 MG tablet Take 1 tablet (20 mg total) by mouth daily. (Patient not taking: Reported on 11/27/2016) 30 tablet 1   No current facility-administered medications on file prior to visit.     Allergies  Allergen Reactions  . Fish Oil Rash  . Penicillins Rash    Family History  Problem Relation Age of Onset  . Hypertension    . Coronary artery disease    . Colon cancer    . Diabetes      Social History   Social History  . Marital status: Married    Spouse name: N/A  . Number of children: 1  . Years of education: N/A   Occupational History  . [employer]   Social History Main Topics  . Smoking status: Former Smoker    Quit date: 10/29/1988  . Smokeless tobacco: Never Used  . Alcohol use 0.0 oz/week     Comment: rare  . Drug use: No  . Sexual activity: Yes   Other Topics Concern   . None   Social History Narrative  . None   Review of Systems - See HPI.  All other ROS are negative.  BP 124/86   Pulse 67   Temp 98.7 F (37.1 C) (Oral)   Resp 14   Ht 5\' 2"  (1.575 m)   Wt 188 lb (85.3 kg)   SpO2 97%   BMI 34.39 kg/m   Physical Exam  Constitutional: She is oriented to person, place, and time and well-developed, well-nourished, and in no distress.  HENT:  Head: Normocephalic and atraumatic.  Right Ear: Tympanic membrane normal.  Left Ear: A middle ear effusion is present.  Nose: Nose normal. No mucosal edema or rhinorrhea. Right sinus exhibits no maxillary sinus tenderness and no frontal sinus tenderness. Left sinus exhibits no maxillary sinus tenderness and no frontal sinus tenderness.  Mouth/Throat: Uvula is midline, oropharynx is clear and moist and mucous membranes are normal.  Eyes: Conjunctivae are normal.  Neck: Neck supple.  Cardiovascular: Normal rate, regular rhythm, normal heart sounds and intact distal pulses.   Pulmonary/Chest: Effort normal and breath sounds normal. No respiratory distress. She has no wheezes. She has no rales. She exhibits no tenderness.  Neurological: She is alert and oriented to person, place, and time.  Skin: Skin is warm and dry. No rash noted.  Psychiatric: Affect normal.  Vitals reviewed.  Assessment/Plan: 1. Acute non-seasonal allergic rhinitis, unspecified trigger Rx Flonase. Start Xyzal daily. Supportive measures and OTC medications reviewed. FU scheduled.  - levocetirizine (XYZAL) 5 MG tablet; Take 1 tablet (5 mg total) by mouth every evening.  Dispense: 30 tablet; Refill: 1 - fluticasone (FLONASE) 50 MCG/ACT nasal spray; Place 2 sprays into both nostrils daily.  Dispense: 16 g; Refill: 1   Leeanne Rio, [place]

## 2016-11-27 NOTE — Patient Instructions (Addendum)
Please start the Xyzal as directed. Also use Flonase daily as directed. Increase fluids.  Start a zinc or echinacea supplement. Place a humidifier in bedroom.  Tylenol if needed for pain.   Follow-up with me next week for your complete physical. Come fasting to that appointment.

## 2016-12-06 ENCOUNTER — Encounter: Payer: Self-pay | Admitting: Emergency Medicine

## 2016-12-06 ENCOUNTER — Ambulatory Visit (INDEPENDENT_AMBULATORY_CARE_PROVIDER_SITE_OTHER): Payer: BC Managed Care – PPO | Admitting: Physician Assistant

## 2016-12-06 ENCOUNTER — Encounter: Payer: Self-pay | Admitting: Physician Assistant

## 2016-12-06 VITALS — BP 132/88 | HR 75 | Temp 98.4°F | Resp 14 | Ht 62.0 in | Wt 186.0 lb

## 2016-12-06 DIAGNOSIS — F3170 Bipolar disorder, currently in remission, most recent episode unspecified: Secondary | ICD-10-CM

## 2016-12-06 DIAGNOSIS — Z0001 Encounter for general adult medical examination with abnormal findings: Secondary | ICD-10-CM | POA: Diagnosis not present

## 2016-12-06 DIAGNOSIS — Z0000 Encounter for general adult medical examination without abnormal findings: Secondary | ICD-10-CM | POA: Insufficient documentation

## 2016-12-06 DIAGNOSIS — E010 Iodine-deficiency related diffuse (endemic) goiter: Secondary | ICD-10-CM | POA: Diagnosis not present

## 2016-12-06 DIAGNOSIS — E785 Hyperlipidemia, unspecified: Secondary | ICD-10-CM

## 2016-12-06 LAB — URINALYSIS, ROUTINE W REFLEX MICROSCOPIC
BILIRUBIN URINE: NEGATIVE
HGB URINE DIPSTICK: NEGATIVE
Ketones, ur: NEGATIVE
LEUKOCYTES UA: NEGATIVE
NITRITE: NEGATIVE
Specific Gravity, Urine: 1.02 (ref 1.000–1.030)
Total Protein, Urine: NEGATIVE
URINE GLUCOSE: NEGATIVE
UROBILINOGEN UA: 0.2 (ref 0.0–1.0)
pH: 6.5 (ref 5.0–8.0)

## 2016-12-06 LAB — LIPID PANEL
CHOLESTEROL: 200 mg/dL (ref 0–200)
HDL: 45.6 mg/dL (ref >39.00)
LDL CALC: 135 mg/dL — AB (ref 0–99)
NonHDL: 154.42
TRIGLYCERIDES: 95 mg/dL (ref 0.0–149.0)
Total CHOL/HDL Ratio: 4
VLDL: 19 mg/dL (ref 0.0–40.0)

## 2016-12-06 LAB — COMPREHENSIVE METABOLIC PANEL
ALBUMIN: 3.9 g/dL (ref 3.5–5.2)
ALT: 14 U/L (ref 0–35)
AST: 17 U/L (ref 0–37)
Alkaline Phosphatase: 60 U/L (ref 39–117)
BUN: 12 mg/dL (ref 6–23)
CALCIUM: 9.1 mg/dL (ref 8.4–10.5)
CHLORIDE: 109 meq/L (ref 96–112)
CO2: 27 mEq/L (ref 19–32)
Creatinine, Ser: 0.64 mg/dL (ref 0.40–1.20)
GFR: 124.57 mL/min (ref >60.00)
Glucose, Bld: 86 mg/dL (ref 70–99)
Potassium: 4.1 mEq/L (ref 3.5–5.1)
Sodium: 142 mEq/L (ref 135–145)
Total Bilirubin: 0.4 mg/dL (ref 0.2–1.2)
Total Protein: 6.9 g/dL (ref 6.0–8.3)

## 2016-12-06 LAB — CBC
HEMATOCRIT: 41.1 % (ref 36.0–46.0)
Hemoglobin: 13.6 g/dL (ref 12.0–15.0)
MCHC: 33.2 g/dL (ref 30.0–36.0)
MCV: 93.4 fl (ref 78.0–100.0)
PLATELETS: 216 10*3/uL (ref 150.0–400.0)
RBC: 4.4 Mil/uL (ref 3.87–5.11)
RDW: 13.2 % (ref 11.5–15.5)
WBC: 10.7 10*3/uL — ABNORMAL HIGH (ref 4.0–10.5)

## 2016-12-06 LAB — HEMOGLOBIN A1C: HEMOGLOBIN A1C: 6 % (ref 4.6–6.5)

## 2016-12-06 LAB — TSH: TSH: 1.98 u[IU]/mL (ref 0.35–4.50)

## 2016-12-06 NOTE — Assessment & Plan Note (Signed)
Followed by Dr. Clovis Pu. Endorses taking medications as directed. Will check morning Depakote level to send to specialist.

## 2016-12-06 NOTE — Assessment & Plan Note (Signed)
Without palpable nodule.  TFT ordered today. Thyroid US ordered as well to further assess.

## 2016-12-06 NOTE — Patient Instructions (Signed)
Please go to the lab for blood work.   Our office will call you with your results unless you have chosen to receive results via MyChart.  If your blood work is normal we will follow-up each year for physicals and as scheduled for chronic medical problems.  If anything is abnormal we will treat accordingly and get you in for a follow-up.  Please continue chronic medications as directed.  Restart your exercise regimen. I would recommend yoga as well.  Keep up with dietary changes!  I am drawing the Depakote level for your psychiatrist. We will make sure the results get to him.   FU with him as scheduled.   Preventive Care 40-64 Years, Female Preventive care refers to lifestyle choices and visits with your health care provider that can promote health and wellness. What does preventive care include?  A yearly physical exam. This is also called an annual well check.  Dental exams once or twice a year.  Routine eye exams. Ask your health care provider how often you should have your eyes checked.  Personal lifestyle choices, including:  Daily care of your teeth and gums.  Regular physical activity.  Eating a healthy diet.  Avoiding tobacco and drug use.  Limiting alcohol use.  Practicing safe sex.  Taking low-dose aspirin daily starting at age 70.  Taking vitamin and mineral supplements as recommended by your health care provider. What happens during an annual well check? The services and screenings done by your health care provider during your annual well check will depend on your age, overall health, lifestyle risk factors, and family history of disease. Counseling  Your health care provider may ask you questions about your:  Alcohol use.  Tobacco use.  Drug use.  Emotional well-being.  Home and relationship well-being.  Sexual activity.  Eating habits.  Work and work Statistician.  Method of birth control.  Menstrual cycle.  Pregnancy  history. Screening  You may have the following tests or measurements:  Height, weight, and BMI.  Blood pressure.  Lipid and cholesterol levels. These may be checked every 5 years, or more frequently if you are over 67 years old.  Skin check.  Lung cancer screening. You may have this screening every year starting at age 67 if you have a 30-pack-year history of smoking and currently smoke or have quit within the past 15 years.  Fecal occult blood test (FOBT) of the stool. You may have this test every year starting at age 50.  Flexible sigmoidoscopy or colonoscopy. You may have a sigmoidoscopy every 5 years or a colonoscopy every 10 years starting at age 78.  Hepatitis C blood test.  Hepatitis B blood test.  Sexually transmitted disease (STD) testing.  Diabetes screening. This is done by checking your blood sugar (glucose) after you have not eaten for a while (fasting). You may have this done every 1-3 years.  Mammogram. This may be done every 1-2 years. Talk to your health care provider about when you should start having regular mammograms. This may depend on whether you have a family history of breast cancer.  BRCA-related cancer screening. This may be done if you have a family history of breast, ovarian, tubal, or peritoneal cancers.  Pelvic exam and Pap test. This may be done every 3 years starting at age 81. Starting at age 55, this may be done every 5 years if you have a Pap test in combination with an HPV test.  Bone density scan. This is done to  screen for osteoporosis. You may have this scan if you are at high risk for osteoporosis. Discuss your test results, treatment options, and if necessary, the need for more tests with your health care provider. Vaccines  Your health care provider may recommend certain vaccines, such as:  Influenza vaccine. This is recommended every year.  Tetanus, diphtheria, and acellular pertussis (Tdap, Td) vaccine. You may need a Td booster  every 10 years.  Varicella vaccine. You may need this if you have not been vaccinated.  Zoster vaccine. You may need this after age 60.  Measles, mumps, and rubella (MMR) vaccine. You may need at least one dose of MMR if you were born in 1957 or later. You may also need a second dose.  Pneumococcal 13-valent conjugate (PCV13) vaccine. You may need this if you have certain conditions and were not previously vaccinated.  Pneumococcal polysaccharide (PPSV23) vaccine. You may need one or two doses if you smoke cigarettes or if you have certain conditions.  Meningococcal vaccine. You may need this if you have certain conditions.  Hepatitis A vaccine. You may need this if you have certain conditions or if you travel or work in places where you may be exposed to hepatitis A.  Hepatitis B vaccine. You may need this if you have certain conditions or if you travel or work in places where you may be exposed to hepatitis B.  Haemophilus influenzae type b (Hib) vaccine. You may need this if you have certain conditions. Talk to your health care provider about which screenings and vaccines you need and how often you need them. This information is not intended to replace advice given to you by your health care provider. Make sure you discuss any questions you have with your health care provider. Document Released: 11/11/2015 Document Revised: 07/04/2016 Document Reviewed: 08/16/2015 Elsevier Interactive Patient Education  2017 [employer].

## 2016-12-06 NOTE — Progress Notes (Signed)
Pre visit review using our clinic review tool, if applicable. No additional management support is needed unless otherwise documented below in the visit note. 

## 2016-12-06 NOTE — Progress Notes (Signed)
Patient presents to clinic today for annual exam.  Patient is fasting for labs. Body mass index is 34.02 kg/m. Patient is currently eating a well-balanced diet overall. Is limiting carbohydrates/starches. Only water and coffee intake. In terms of exercise -- patient is not currently doing any regular exercise. Was previously doing Zumba 3 x week. Is going to restart in the next month.   Chronic Issues: Bipolar Disorder -- Followed by Psychiatry. Is currently on a regimen of Depakote ER 750 mg daily, Zoloft 50 mg. Endorses taking medications as directed. Notes stable mood without manic episodes. Denies SI/HI. Has follow-up scheduled in April with specialist. Is needing Depakote level drawn for specialist (Dr. Clovis Pu)   Health Maintenance: Immunizations -- Declines flu shot. Tetanus up-to-date.  Colonoscopy -- up-to-date.  Mammogram -- up-to-date per patient. Will get records from Dr. Philis Pique. PAP -- up-to-date per patient. Followed by Dr. Philis Pique.   Past Medical History:  Diagnosis Date  . Allergy   . Anemia   . Anxiety   . Arthritis   . Bipolar disorder ([place])   . Depression   . Hyperlipidemia     Past Surgical History:  Procedure Laterality Date  . CESAREAN SECTION    . SIGMOIDOSCOPY      Current Outpatient Prescriptions on File Prior to Visit  Medication Sig Dispense Refill  . divalproex (DEPAKOTE ER) 250 MG 24 hr tablet Take 750 mg by mouth at bedtime.  11  . fluticasone (FLONASE) 50 MCG/ACT nasal spray Place 2 sprays into both nostrils daily. 16 g 1  . levocetirizine (XYZAL) 5 MG tablet Take 1 tablet (5 mg total) by mouth every evening. 30 tablet 1  . norethindrone-ethinyl estradiol (JINTELI) 1-5 MG-MCG TABS Take 1 tablet by mouth daily.    . sertraline (ZOLOFT) 50 MG tablet Take 50 mg by mouth at bedtime.     Marland Kitchen zolpidem (AMBIEN) 10 MG tablet Take 10 mg by mouth at bedtime as needed for sleep.      No current facility-administered medications on file prior to visit.      Allergies  Allergen Reactions  . Fish Oil Rash  . Penicillins Rash    Family History  Problem Relation Age of Onset  . Hypertension    . Coronary artery disease    . Colon cancer    . Diabetes      Social History   Social History  . Marital status: Married    Spouse name: N/A  . Number of children: 1  . Years of education: N/A   Occupational History  . [place]   Social History Main Topics  . Smoking status: Former Smoker    Quit date: 10/29/1988  . Smokeless tobacco: Never Used  . Alcohol use 0.0 oz/week     Comment: rare  . Drug use: No  . Sexual activity: Yes   Other Topics Concern  . Not on file   Social History Narrative  . No narrative on file   Review of Systems  Constitutional: Negative for fever and weight loss.  HENT: Negative for ear discharge, ear pain, hearing loss and tinnitus.   Eyes: Negative for blurred vision, double vision, photophobia and pain.  Respiratory: Negative for cough and shortness of breath.   Cardiovascular: Negative for chest pain and palpitations.  Gastrointestinal: Negative for abdominal pain, blood in stool, constipation, diarrhea, heartburn, melena, nausea and vomiting.  Genitourinary: Negative for dysuria, flank pain, frequency, hematuria and urgency.  Musculoskeletal: Negative for  falls.  Neurological: Negative for dizziness, loss of consciousness and headaches.  Endo/Heme/Allergies: Negative for environmental allergies.  Psychiatric/Behavioral: Negative for depression, hallucinations, substance abuse and suicidal ideas. The patient is not nervous/anxious and does not have insomnia.    BP 132/88   Pulse 75   Temp 98.4 F (36.9 C) (Oral)   Resp 14   Ht 5\' 2"  (1.575 m)   Wt 186 lb (84.4 kg)   SpO2 97%   BMI 34.02 kg/m   Physical Exam  Constitutional: She is oriented to person, place, and time and well-developed, well-nourished, and in no distress.  HENT:  Head: Normocephalic and  atraumatic.  Right Ear: External ear normal.  Left Ear: External ear normal.  Nose: Nose normal.  Mouth/Throat: Oropharynx is clear and moist. No oropharyngeal exudate.  TM within normal limits bilaterally.  Eyes: Conjunctivae and EOM are normal. Pupils are equal, round, and reactive to light.  Neck: Neck supple. Thyromegaly present.  Diffuse thyromegaly without palpable nodule.  Cardiovascular: Normal rate, regular rhythm, normal heart sounds and intact distal pulses.   Pulmonary/Chest: Effort normal and breath sounds normal. No respiratory distress. She has no wheezes. She has no rales. She exhibits no tenderness.  Abdominal: Soft. Bowel sounds are normal. She exhibits no distension. There is no tenderness. There is no rebound and no guarding.  Lymphadenopathy:    She has no cervical adenopathy.  Neurological: She is alert and oriented to person, place, and time.  Skin: Skin is warm and dry. No rash noted.  Psychiatric: Affect normal.  Vitals reviewed.  Assessment/Plan: Bipolar affective disorder in remission Followed by Dr. Clovis Pu. Endorses taking medications as directed. Will check morning Depakote level to send to specialist.  Hyperlipidemia Previously on Livalo.  Has not taken medication in quite some time. Is making strides with diet and exercise. Repeat fasting labs today. Will treat accordingly.   Visit for preventive health examination Depression screen negative. Health Maintenance reviewed -- HIV screen today. Declines flu. Tetanus, PAP and colonoscopy up-to-date. Patient endorses mammogram is up-to-date. Will get records from GYN. Preventive schedule discussed and handout given in AVS. Will obtain fasting labs today.   Thyromegaly Without palpable nodule.  TFT ordered today. Thyroid US ordered as well to further assess.    Leeanne Rio, PA-C

## 2016-12-06 NOTE — Assessment & Plan Note (Signed)
Depression screen negative. Health Maintenance reviewed -- HIV screen today. Declines flu. Tetanus, PAP and colonoscopy up-to-date. Patient endorses mammogram is up-to-date. Will get records from GYN. Preventive schedule discussed and handout given in AVS. Will obtain fasting labs today.

## 2016-12-06 NOTE — Assessment & Plan Note (Signed)
Previously on Livalo.  Has not taken medication in quite some time. Is making strides with diet and exercise. Repeat fasting labs today. Will treat accordingly.

## 2016-12-07 ENCOUNTER — Encounter: Payer: Self-pay | Admitting: Physician Assistant

## 2016-12-07 LAB — VALPROIC ACID LEVEL: Valproic Acid Lvl: 58.2 ug/mL (ref 50.0–100.0)

## 2016-12-07 LAB — HIV ANTIBODY (ROUTINE TESTING W REFLEX): HIV 1&2 Ab, 4th Generation: NONREACTIVE

## 2016-12-08 ENCOUNTER — Ambulatory Visit (HOSPITAL_BASED_OUTPATIENT_CLINIC_OR_DEPARTMENT_OTHER)
Admission: RE | Admit: 2016-12-08 | Discharge: 2016-12-08 | Disposition: A | Discharge status: Home / Self Care | Payer: BC Managed Care – PPO | Source: Ambulatory Visit | Attending: Physician Assistant | Admitting: Physician Assistant

## 2016-12-08 DIAGNOSIS — R002 Palpitations: Secondary | ICD-10-CM | POA: Diagnosis not present

## 2016-12-08 DIAGNOSIS — E010 Iodine-deficiency related diffuse (endemic) goiter: Secondary | ICD-10-CM | POA: Diagnosis not present

## 2016-12-10 ENCOUNTER — Encounter: Payer: Self-pay | Admitting: Physician Assistant

## 2016-12-13 ENCOUNTER — Encounter: Payer: Self-pay | Admitting: Emergency Medicine

## 2017-02-27 ENCOUNTER — Telehealth: Payer: Self-pay | Admitting: Physician Assistant

## 2017-02-27 NOTE — Telephone Encounter (Signed)
Pt asking that a Rx for xanax be sent into [facility] on [address] [facility] [address]

## 2017-02-27 NOTE — Telephone Encounter (Signed)
She is followed by Psychiatry (Dr. Clovis Pu I believe). As such, further refills of this medication will be at his discretion since it is regarding the things that he manages.

## 2017-02-27 NOTE — Telephone Encounter (Signed)
Spoke with patient and she has been taking Diazepam as needed for anxiety. She states she just ran out of medication. The rx was filled in 2016. Please advise

## 2017-02-28 NOTE — Telephone Encounter (Signed)
LMOVM if she is still seeing Psychatrist would like them to prescribe the Diazepam.

## 2017-03-01 NOTE — Telephone Encounter (Signed)
Patient is aware to contact Dr. Casimiro Needle office for this medication.

## 2017-03-14 LAB — HM MAMMOGRAPHY

## 2017-03-19 ENCOUNTER — Encounter: Payer: Self-pay | Admitting: Emergency Medicine

## 2017-04-25 ENCOUNTER — Encounter: Payer: Self-pay | Admitting: Physician Assistant

## 2017-04-25 ENCOUNTER — Ambulatory Visit (INDEPENDENT_AMBULATORY_CARE_PROVIDER_SITE_OTHER): Payer: BC Managed Care – PPO | Admitting: Physician Assistant

## 2017-04-25 VITALS — BP 138/90 | HR 71 | Temp 99.0°F | Resp 14 | Ht 62.0 in | Wt 185.0 lb

## 2017-04-25 DIAGNOSIS — M545 Low back pain, unspecified: Secondary | ICD-10-CM

## 2017-04-25 MED ORDER — MELOXICAM 15 MG PO TABS: 15.0000 mg | ORAL_TABLET | Freq: Every day | ORAL | 0 refills | Status: DC

## 2017-04-25 MED ORDER — CYCLOBENZAPRINE HCL 10 MG PO TABS: 10.0000 mg | ORAL_TABLET | Freq: Three times a day (TID) | ORAL | 0 refills | Status: DC | PRN

## 2017-04-25 NOTE — Progress Notes (Signed)
Pre visit review using our clinic review tool, if applicable. No additional management support is needed unless otherwise documented below in the visit note. 

## 2017-04-25 NOTE — Progress Notes (Signed)
Patient with history of lumbar DDD presents to clinic today c/o left-sided low back pain since Saturday after an episode of heavy lifting. Pain is sharp with spasms and radiated into her left thigh. Denies trauma or injury. Denies numbness, tingling or weakness of extremities. Denies saddle anesthesia or change to bowel/bladder habits..   Past Medical History:  Diagnosis Date  . Allergy   . Anemia   . Anxiety   . Arthritis   . Bipolar disorder ([place])   . Depression   . Hyperlipidemia     Current Outpatient Prescriptions on File Prior to Visit  Medication Sig Dispense Refill  . divalproex (DEPAKOTE ER) 250 MG 24 hr tablet Take 750 mg by mouth at bedtime.  11  . fluticasone (FLONASE) 50 MCG/ACT nasal spray Place 2 sprays into both nostrils daily. 16 g 1  . levocetirizine (XYZAL) 5 MG tablet Take 1 tablet (5 mg total) by mouth every evening. 30 tablet 1  . norethindrone-ethinyl estradiol (JINTELI) 1-5 MG-MCG TABS Take 1 tablet by mouth daily.    . sertraline (ZOLOFT) 50 MG tablet Take 50 mg by mouth at bedtime.     Marland Kitchen zolpidem (AMBIEN) 10 MG tablet Take 10 mg by mouth at bedtime as needed for sleep.      No current facility-administered medications on file prior to visit.     Allergies  Allergen Reactions  . Fish Oil Rash  . Penicillins Rash    Family History  Problem Relation Age of Onset  . Hypertension Unknown   . Coronary artery disease Unknown   . Colon cancer Unknown   . Diabetes Unknown     Social History   Social History  . Marital status: Married    Spouse name: N/A  . Number of children: 1  . Years of education: N/A   Occupational History  . [employer]   Social History Main Topics  . Smoking status: Former Smoker    Quit date: 10/29/1988  . Smokeless tobacco: Never Used  . Alcohol use 0.0 oz/week     Comment: rare  . Drug use: No  . Sexual activity: Yes   Other Topics Concern  . None   Social History Narrative  . None    Review of Systems - See HPI.  All other ROS are negative.  BP 138/90   Pulse 71   Temp 99 F (37.2 C) (Oral)   Resp 14   Ht 5\' 2"  (1.575 m)   Wt 185 lb (83.9 kg)   SpO2 98%   BMI 33.84 kg/m   Physical Exam  Constitutional: She is oriented to person, place, and time and well-developed, well-nourished, and in no distress.  HENT:  Head: Normocephalic and atraumatic.  Neck: Neck supple.  Cardiovascular: Normal rate, regular rhythm, normal heart sounds and intact distal pulses.   Pulmonary/Chest: Effort normal and breath sounds normal. No respiratory distress. She has no wheezes. She has no rales. She exhibits no tenderness.  Musculoskeletal:       Thoracic back: Normal.       Lumbar back: She exhibits tenderness, pain and spasm. She exhibits normal range of motion and no bony tenderness.  Neurological: She is alert and oriented to person, place, and time.  Skin: Skin is warm and dry. No rash noted.  Psychiatric: Affect normal.  Vitals reviewed.   Recent Results (from the past 2160 hour(s))  [facility] MAMMOGRAPHY     Status: None   Collection Time: 03/14/17 12:00  AM  Result Value Ref Range   [facility] Mammogram 0-4 Bi-Rad 0-4 Bi-Rad, Self Reported Normal    Assessment/Plan: 1. Back pain, lumbosacral Start Flexeril and Mobic. OTC medications and supportive measures discussed. Alarm signs/symptoms reviewed. Return precautions discussed.  - cyclobenzaprine (FLEXERIL) 10 MG tablet; Take 1 tablet (10 mg total) by mouth 3 (three) times daily as needed for muscle spasms.  Dispense: 30 tablet; Refill: 0 - meloxicam (MOBIC) 15 MG tablet; Take 1 tablet (15 mg total) by mouth daily.  Dispense: 15 tablet; Refill: 0   Leeanne Rio, [place]

## 2017-04-25 NOTE — Patient Instructions (Signed)
Please take Mobic once daily with food.  Use Tylenol if needed for breakthrough pain later in the day.  The Flexeril is a muscle relaxant and can be used each evening. You can use up to 3 times a day, but you are not to drive or operate heavy machinery while on this medication.  Apply Icy Hot to the area. Use a heating pad as directed.  No heavy lifting or overexertion!  Follow-up if symptoms are not resolving.

## 2017-06-21 ENCOUNTER — Encounter: Payer: Self-pay | Admitting: Physician Assistant

## 2017-06-21 ENCOUNTER — Ambulatory Visit (INDEPENDENT_AMBULATORY_CARE_PROVIDER_SITE_OTHER): Payer: BC Managed Care – PPO | Admitting: Physician Assistant

## 2017-06-21 VITALS — BP 152/90 | HR 74 | Temp 98.7°F | Resp 14 | Ht 62.0 in | Wt 188.0 lb

## 2017-06-21 DIAGNOSIS — J3089 Other allergic rhinitis: Secondary | ICD-10-CM

## 2017-06-21 MED ORDER — MONTELUKAST SODIUM 10 MG PO TABS: 10.0000 mg | ORAL_TABLET | Freq: Every day | ORAL | 3 refills | Status: DC

## 2017-06-21 NOTE — Progress Notes (Signed)
Patient presents to clinic today c/o post-nasal drip, rhinorrhea, nasal congestion with dry cough over the past few weeks. Has noted some intermittent chest tightness. Patient with history of seasonal/environmental allergies. Is prescribed Xyzal but has not been taking as directed. Denies chest congestion, sinus pain, ear pain or tooth pain. Denies fever, chills, malaise or fatigue.   Past Medical History:  Diagnosis Date  . Allergy   . Anemia   . Anxiety   . Arthritis   . Bipolar disorder ([place])   . Depression   . Hyperlipidemia     Current Outpatient Prescriptions on File Prior to Visit  Medication Sig Dispense Refill  . cyclobenzaprine (FLEXERIL) 10 MG tablet Take 1 tablet (10 mg total) by mouth 3 (three) times daily as needed for muscle spasms. 30 tablet 0  . divalproex (DEPAKOTE ER) 250 MG 24 hr tablet Take 750 mg by mouth at bedtime.  11  . fluticasone (FLONASE) 50 MCG/ACT nasal spray Place 2 sprays into both nostrils daily. 16 g 1  . levocetirizine (XYZAL) 5 MG tablet Take 1 tablet (5 mg total) by mouth every evening. 30 tablet 1  . norethindrone-ethinyl estradiol (JINTELI) 1-5 MG-MCG TABS Take 1 tablet by mouth daily.    . sertraline (ZOLOFT) 50 MG tablet Take 50 mg by mouth at bedtime.     Marland Kitchen zolpidem (AMBIEN) 10 MG tablet Take 10 mg by mouth at bedtime as needed for sleep.      No current facility-administered medications on file prior to visit.     Allergies  Allergen Reactions  . Fish Oil Rash  . Penicillins Rash    Family History  Problem Relation Age of Onset  . Hypertension Unknown   . Coronary artery disease Unknown   . Colon cancer Unknown   . Diabetes Unknown     Social History   Social History  . Marital status: Married    Spouse name: N/A  . Number of children: 1  . Years of education: N/A   Occupational History  . [employer]   Social History Main Topics  . Smoking status: Former Smoker    Quit date: 10/29/1988  .  Smokeless tobacco: Never Used  . Alcohol use 0.0 oz/week     Comment: rare  . Drug use: No  . Sexual activity: Yes   Other Topics Concern  . None   Social History Narrative  . None   Review of Systems - See HPI.  All other ROS are negative.  BP (!) 152/90   Pulse 74   Temp 98.7 F (37.1 C) (Oral)   Resp 14   Ht 5\' 2"  (1.575 m)   Wt 188 lb (85.3 kg)   SpO2 97%   BMI 34.39 kg/m   Physical Exam  Constitutional: She is well-developed, well-nourished, and in no distress.  HENT:  Head: Normocephalic and atraumatic.  Right Ear: Tympanic membrane normal.  Left Ear: Tympanic membrane normal.  Nose: Mucosal edema and rhinorrhea present. Right sinus exhibits no maxillary sinus tenderness and no frontal sinus tenderness. Left sinus exhibits no maxillary sinus tenderness and no frontal sinus tenderness.  Mouth/Throat: Uvula is midline, oropharynx is clear and moist and mucous membranes are normal.  Eyes: Conjunctivae are normal.  Neck: Neck supple.  Cardiovascular: Normal rate, regular rhythm, normal heart sounds and intact distal pulses.   Pulmonary/Chest: Effort normal and breath sounds normal. No respiratory distress. She has no wheezes. She has no rales. She exhibits no  tenderness.  Skin: Skin is warm and dry. No rash noted.  Psychiatric: Affect normal.  Vitals reviewed.  Assessment/Plan: 1. Non-seasonal allergic rhinitis, unspecified trigger Causing some mild reactive airway/tightness. Lungs CTAB. Will restart Xyzal and add-on Flonase. Will start short trial of Singulair to help with allergic airway. Supportive measures and OTC medications discussed. Follow-up scheduled.    Leeanne Rio, PA-C

## 2017-06-21 NOTE — Progress Notes (Signed)
Pre visit review using our clinic review tool, if applicable. No additional management support is needed unless otherwise documented below in the visit note. 

## 2017-06-21 NOTE — Patient Instructions (Signed)
Please stay well-hydrated.  Restart Xyzal and Flonase. We are adding on Singulair short term for the chest tightness secondary to allergic inflammation.  Place a humidifier in the bedroom.  Limit fan running at night.   Follow-up with me in 2 weeks. Call or return sooner if symptoms are not improving.

## 2017-07-08 ENCOUNTER — Ambulatory Visit (INDEPENDENT_AMBULATORY_CARE_PROVIDER_SITE_OTHER): Payer: BC Managed Care – PPO | Admitting: Physician Assistant

## 2017-07-08 ENCOUNTER — Encounter: Payer: Self-pay | Admitting: Physician Assistant

## 2017-07-08 VITALS — BP 138/100 | HR 74 | Temp 98.7°F | Resp 14 | Ht 62.0 in | Wt 185.0 lb

## 2017-07-08 DIAGNOSIS — R011 Cardiac murmur, unspecified: Secondary | ICD-10-CM | POA: Diagnosis not present

## 2017-07-08 DIAGNOSIS — R002 Palpitations: Secondary | ICD-10-CM

## 2017-07-08 DIAGNOSIS — I10 Essential (primary) hypertension: Secondary | ICD-10-CM

## 2017-07-08 MED ORDER — HYDROCHLOROTHIAZIDE 12.5 MG PO TABS: 12.5000 mg | ORAL_TABLET | Freq: Every day | ORAL | 1 refills | Status: DC

## 2017-07-08 NOTE — Progress Notes (Signed)
Pre visit review using our clinic review tool, if applicable. No additional management support is needed unless otherwise documented below in the visit note. 

## 2017-07-08 NOTE — Patient Instructions (Signed)
Please start the hydrochlorothiazide 12.5 mg daily.  Stay hydrated and start the diet below. Stop the excess caffeine intake and energy drinks.  I am setting you up with Cardiology for assessment of the faint murmur I hear along with the palpitations you have had. They will contact you with an appointment.  If you note any chest pain or shortness of breath, please go to the ER.  Follow-up with me in 2 weeks.    DASH Eating Plan DASH stands for "Dietary Approaches to Stop Hypertension." The DASH eating plan is a healthy eating plan that has been shown to reduce high blood pressure (hypertension). It may also reduce your risk for type 2 diabetes, heart disease, and stroke. The DASH eating plan may also help with weight loss. What are tips for following this plan? General guidelines  Avoid eating more than 2,300 mg (milligrams) of salt (sodium) a day. If you have hypertension, you may need to reduce your sodium intake to 1,500 mg a day.  Limit alcohol intake to no more than 1 drink a day for nonpregnant women and 2 drinks a day for men. One drink equals 12 oz of beer, 5 oz of wine, or 1 oz of hard liquor.  Work with your health care provider to maintain a healthy body weight or to lose weight. Ask what an ideal weight is for you.  Get at least 30 minutes of exercise that causes your heart to beat faster (aerobic exercise) most days of the week. Activities may include walking, swimming, or biking.  Work with your health care provider or diet and nutrition specialist (dietitian) to adjust your eating plan to your individual calorie needs. Reading food labels  Check food labels for the amount of sodium per serving. Choose foods with less than 5 percent of the Daily Value of sodium. Generally, foods with less than 300 mg of sodium per serving fit into this eating plan.  To find whole grains, look for the word "whole" as the first word in the ingredient list. Shopping  Buy products labeled  as "low-sodium" or "no salt added."  Buy fresh foods. Avoid canned foods and premade or frozen meals. Cooking  Avoid adding salt when cooking. Use salt-free seasonings or herbs instead of table salt or sea salt. Check with your health care provider or pharmacist before using salt substitutes.  Do not fry foods. Cook foods using healthy methods such as baking, boiling, grilling, and broiling instead.  Cook with heart-healthy oils, such as olive, canola, soybean, or sunflower oil. Meal planning   Eat a balanced diet that includes: ? 5 or more servings of fruits and vegetables each day. At each meal, try to fill half of your plate with fruits and vegetables. ? Up to 6-8 servings of whole grains each day. ? Less than 6 oz of lean meat, poultry, or fish each day. A 3-oz serving of meat is about the same size as a deck of cards. One egg equals 1 oz. ? 2 servings of low-fat dairy each day. ? A serving of nuts, seeds, or beans 5 times each week. ? Heart-healthy fats. Healthy fats called Omega-3 fatty acids are found in foods such as flaxseeds and coldwater fish, like sardines, salmon, and mackerel.  Limit how much you eat of the following: ? Canned or prepackaged foods. ? Food that is high in trans fat, such as fried foods. ? Food that is high in saturated fat, such as fatty meat. ? Sweets, desserts, sugary drinks,  and other foods with added sugar. ? Full-fat dairy products.  Do not salt foods before eating.  Try to eat at least 2 vegetarian meals each week.  Eat more home-cooked food and less restaurant, buffet, and fast food.  When eating at a restaurant, ask that your food be prepared with less salt or no salt, if possible. What foods are recommended? The items listed may not be a complete list. Talk with your dietitian about what dietary choices are best for you. Grains Whole-grain or whole-wheat bread. Whole-grain or whole-wheat pasta. Brown rice. [place]. Bulgur.  Whole-grain and low-sodium cereals. Pita bread. Low-fat, low-sodium crackers. Whole-wheat flour tortillas. Vegetables Fresh or frozen vegetables (raw, steamed, roasted, or grilled). Low-sodium or reduced-sodium tomato and vegetable juice. Low-sodium or reduced-sodium tomato sauce and tomato paste. Low-sodium or reduced-sodium canned vegetables. Fruits All fresh, dried, or frozen fruit. Canned fruit in natural juice (without added sugar). Meat and other protein foods Skinless chicken or [place]. Ground chicken or [place]. Pork with fat trimmed off. Fish and seafood. Egg whites. Dried beans, peas, or lentils. Unsalted nuts, nut butters, and seeds. Unsalted canned beans. Lean cuts of beef with fat trimmed off. Low-sodium, lean deli meat. Dairy Low-fat (1%) or fat-free (skim) milk. Fat-free, low-fat, or reduced-fat cheeses. Nonfat, low-sodium ricotta or cottage cheese. Low-fat or nonfat yogurt. Low-fat, low-sodium cheese. Fats and oils Soft margarine without trans fats. Vegetable oil. Low-fat, reduced-fat, or light mayonnaise and salad dressings (reduced-sodium). Canola, safflower, olive, soybean, and sunflower oils. Avocado. Seasoning and other foods Herbs. Spices. Seasoning mixes without salt. Unsalted popcorn and pretzels. Fat-free sweets. What foods are not recommended? The items listed may not be a complete list. Talk with your dietitian about what dietary choices are best for you. Grains Baked goods made with fat, such as croissants, muffins, or some breads. Dry pasta or rice meal packs. Vegetables Creamed or fried vegetables. Vegetables in a cheese sauce. Regular canned vegetables (not low-sodium or reduced-sodium). Regular canned tomato sauce and paste (not low-sodium or reduced-sodium). Regular tomato and vegetable juice (not low-sodium or reduced-sodium). Angie Fava. Olives. Fruits Canned fruit in a light or heavy syrup. Fried fruit. Fruit in cream or butter sauce. Meat and other protein  foods Fatty cuts of meat. Ribs. Fried meat. Berniece Salines. Sausage. Bologna and other processed lunch meats. Salami. Fatback. Hotdogs. Bratwurst. Salted nuts and seeds. Canned beans with added salt. Canned or smoked fish. Whole eggs or egg yolks. Chicken or [place] with skin. Dairy Whole or 2% milk, cream, and half-and-half. Whole or full-fat cream cheese. Whole-fat or sweetened yogurt. Full-fat cheese. Nondairy creamers. Whipped toppings. Processed cheese and cheese spreads. Fats and oils Butter. Stick margarine. Lard. Shortening. Ghee. Bacon fat. Tropical oils, such as coconut, palm kernel, or palm oil. Seasoning and other foods Salted popcorn and pretzels. Onion salt, garlic salt, seasoned salt, table salt, and sea salt. Worcestershire sauce. Tartar sauce. Barbecue sauce. Teriyaki sauce. Soy sauce, including reduced-sodium. Steak sauce. Canned and packaged gravies. Fish sauce. Oyster sauce. Cocktail sauce. Horseradish that you find on the shelf. Ketchup. Mustard. Meat flavorings and tenderizers. Bouillon cubes. Hot sauce and Tabasco sauce. Premade or packaged marinades. Premade or packaged taco seasonings. Relishes. Regular salad dressings. Where to find more information:  National Heart, Lung, and [place]: https://wilson-eaton.com/  American Heart Association: www.heart.org Summary  The DASH eating plan is a healthy eating plan that has been shown to reduce high blood pressure (hypertension). It may also reduce your risk for type 2 diabetes, heart disease, and stroke.  With the DASH eating plan, you should limit salt (sodium) intake to 2,300 mg a day. If you have hypertension, you may need to reduce your sodium intake to 1,500 mg a day.  When on the DASH eating plan, aim to eat more fresh fruits and vegetables, whole grains, lean proteins, low-fat dairy, and heart-healthy fats.  Work with your health care provider or diet and nutrition specialist (dietitian) to adjust your eating plan to your  individual calorie needs. This information is not intended to replace advice given to you by your health care provider. Make sure you discuss any questions you have with your health care provider. Document Released: 10/04/2011 Document Revised: 10/08/2016 Document Reviewed: 10/08/2016 Elsevier Interactive Patient Education  2017 [employer].

## 2017-07-08 NOTE — Progress Notes (Signed)
Patient presents to clinic today to discuss elevated BP. Endorses seeing dentist who noted BP was significantly elevated. Endorses intermittent frontal headaches, occasional palpitations. Denies chest pain, shortness of breath, lightheadedness or dizziness. Endorses significant stressors recently including a father-in-law with dementia. Is trying to start working on diet and exercise. Feels that BP levels are related to diet. Does endorse large amount of caffeine intake and the new Celsius metabolism drinks. Endorses poor diet previously that was high in sodium and fat. Has recently started better diet plan since her daughter has been home.   BP Readings from Last 3 Encounters:  07/08/17 (!) 138/100  06/21/17 (!) 152/90  04/25/17 138/90   Past Medical History:  Diagnosis Date  . Allergy   . Anemia   . Anxiety   . Arthritis   . Bipolar disorder ([place])   . Depression   . Hyperlipidemia     Current Outpatient Prescriptions on File Prior to Visit  Medication Sig Dispense Refill  . cyclobenzaprine (FLEXERIL) 10 MG tablet Take 1 tablet (10 mg total) by mouth 3 (three) times daily as needed for muscle spasms. 30 tablet 0  . divalproex (DEPAKOTE ER) 250 MG 24 hr tablet Take 750 mg by mouth at bedtime.  11  . fluticasone (FLONASE) 50 MCG/ACT nasal spray Place 2 sprays into both nostrils daily. 16 g 1  . levocetirizine (XYZAL) 5 MG tablet Take 1 tablet (5 mg total) by mouth every evening. 30 tablet 1  . montelukast (SINGULAIR) 10 MG tablet Take 1 tablet (10 mg total) by mouth at bedtime. 30 tablet 3  . norethindrone-ethinyl estradiol (JINTELI) 1-5 MG-MCG TABS Take 1 tablet by mouth daily.    . sertraline (ZOLOFT) 50 MG tablet Take 50 mg by mouth at bedtime.     Marland Kitchen zolpidem (AMBIEN) 10 MG tablet Take 10 mg by mouth at bedtime as needed for sleep.      No current facility-administered medications on file prior to visit.     Allergies  Allergen Reactions  . Fish Oil Rash  . Penicillins Rash      Family History  Problem Relation Age of Onset  . Hypertension Unknown   . Coronary artery disease Unknown   . Colon cancer Unknown   . Diabetes Unknown     Social History   Social History  . Marital status: Married    Spouse name: N/A  . Number of children: 1  . Years of education: N/A   Occupational History  . [place]   Social History Main Topics  . Smoking status: Former Smoker    Quit date: 10/29/1988  . Smokeless tobacco: Never Used  . Alcohol use 0.0 oz/week     Comment: rare  . Drug use: No  . Sexual activity: Yes   Other Topics Concern  . None   Social History Narrative  . None   Review of Systems - See HPI.  All other ROS are negative.  BP (!) 138/100   Pulse 74   Temp 98.7 F (37.1 C) (Oral)   Resp 14   Ht 5\' 2"  (1.575 m)   Wt 185 lb (83.9 kg)   SpO2 99%   BMI 33.84 kg/m   Physical Exam  Constitutional: She is oriented to person, place, and time and well-developed, well-nourished, and in no distress.  HENT:  Head: Normocephalic and atraumatic.  Eyes: Conjunctivae are normal.  Neck: Neck supple. No thyromegaly present.  Cardiovascular: Normal rate, regular rhythm and  intact distal pulses.   Murmur heard.  Systolic murmur is present with a grade of 2/6  Pulmonary/Chest: Effort normal and breath sounds normal. No respiratory distress. She has no wheezes. She has no rales. She exhibits no tenderness.  Neurological: She is alert and oriented to person, place, and time.  Skin: Skin is warm and dry. No rash noted.  Psychiatric: Affect normal.  Vitals reviewed.  Assessment/Plan: 1. Essential hypertension EKG obtained revealing NSR with non-specific T abnormality. Overall unchanged since last EKG. Will start HCTZ 12.5 mg daily. Discussed dietary and lifestyle contributors to her BP. Limit caffeine intake. DASH diet. Referral to Cardiology placed for further assessment/stress testing giving new murmur and family history.  Patient to start Pravachol for HLD. Will start 20 mg dose.  - EKG 12-Lead - hydrochlorothiazide (HYDRODIURIL) 12.5 MG tablet; Take 1 tablet (12.5 mg total) by mouth daily.  Dispense: 30 tablet; Refill: 1 - Ambulatory referral to Cardiology  2. Palpitations Suspect secondary to caffeine intake. EKG without arrhythmia. Will have patient decrease caffeine intake. Referral to Cardiology placed. - EKG 12-Lead - Ambulatory referral to Cardiology  3. Undiagnosed cardiac murmurs Referral to Cards placed for echocardiogram and further assessment.  - Ambulatory referral to Cardiology   Leeanne Rio, PA-C

## 2017-07-09 MED ORDER — PRAVASTATIN SODIUM 20 MG PO TABS: 20.0000 mg | ORAL_TABLET | Freq: Every day | ORAL | 1 refills | Status: DC

## 2017-07-22 ENCOUNTER — Ambulatory Visit (INDEPENDENT_AMBULATORY_CARE_PROVIDER_SITE_OTHER): Payer: BC Managed Care – PPO | Admitting: Physician Assistant

## 2017-07-22 ENCOUNTER — Encounter: Payer: Self-pay | Admitting: Physician Assistant

## 2017-07-22 VITALS — BP 142/80 | HR 65 | Temp 99.0°F | Resp 14 | Ht 62.0 in | Wt 185.0 lb

## 2017-07-22 DIAGNOSIS — I10 Essential (primary) hypertension: Secondary | ICD-10-CM

## 2017-07-22 NOTE — Patient Instructions (Signed)
Please go to the lab. I will call you with your results. If all looks good we will increase your HCTZ to 25 mg daily.  Keep a well-balanced diet.  Follow-up with Cardiology as scheduled.

## 2017-07-22 NOTE — Progress Notes (Signed)
Patient presents to clinic today for follow-up for hypertension. At last visit, she was started on HCTZ 12.5 mg daily. Is taking as directed without side effect. Is still dealing with stressors (father-in-law). Patient denies chest pain, palpitations, lightheadedness, dizziness, vision changes or frequent headaches. Denies any residual palpitations since stopping Caffeine. Has Cardiology appointment on 07/29/2017.  Past Medical History:  Diagnosis Date  . Allergy   . Anemia   . Anxiety   . Arthritis   . Bipolar disorder ([place])   . Depression   . Hyperlipidemia     Current Outpatient Prescriptions on File Prior to Visit  Medication Sig Dispense Refill  . cyclobenzaprine (FLEXERIL) 10 MG tablet Take 1 tablet (10 mg total) by mouth 3 (three) times daily as needed for muscle spasms. 30 tablet 0  . divalproex (DEPAKOTE ER) 250 MG 24 hr tablet Take 750 mg by mouth at bedtime.  11  . fluticasone (FLONASE) 50 MCG/ACT nasal spray Place 2 sprays into both nostrils daily. 16 g 1  . hydrochlorothiazide (HYDRODIURIL) 12.5 MG tablet Take 1 tablet (12.5 mg total) by mouth daily. 30 tablet 1  . levocetirizine (XYZAL) 5 MG tablet Take 1 tablet (5 mg total) by mouth every evening. 30 tablet 1  . montelukast (SINGULAIR) 10 MG tablet Take 1 tablet (10 mg total) by mouth at bedtime. 30 tablet 3  . norethindrone-ethinyl estradiol (JINTELI) 1-5 MG-MCG TABS Take 1 tablet by mouth daily.    . pravastatin (PRAVACHOL) 20 MG tablet Take 1 tablet (20 mg total) by mouth daily. 30 tablet 1  . sertraline (ZOLOFT) 50 MG tablet Take 50 mg by mouth at bedtime.     Marland Kitchen zolpidem (AMBIEN) 10 MG tablet Take 10 mg by mouth at bedtime as needed for sleep.      No current facility-administered medications on file prior to visit.     Allergies  Allergen Reactions  . Fish Oil Rash  . Penicillins Rash    Family History  Problem Relation Age of Onset  . Hypertension Unknown   . Coronary artery disease Unknown   . Colon  cancer Unknown   . Diabetes Unknown     Social History   Social History  . Marital status: Married    Spouse name: N/A  . Number of children: 1  . Years of education: N/A   Occupational History  . [place]   Social History Main Topics  . Smoking status: Former Smoker    Quit date: 10/29/1988  . Smokeless tobacco: Never Used  . Alcohol use 0.0 oz/week     Comment: rare  . Drug use: No  . Sexual activity: Yes   Other Topics Concern  . None   Social History Narrative  . None   Review of Systems - See HPI.  All other ROS are negative.  BP (!) 142/80   Pulse 65   Temp 99 F (37.2 C) (Oral)   Resp 14   Ht 5\' 2"  (1.575 m)   Wt 185 lb (83.9 kg)   SpO2 98%   BMI 33.84 kg/m   Physical Exam  Constitutional: She is oriented to person, place, and time and well-developed, well-nourished, and in no distress.  HENT:  Head: Normocephalic and atraumatic.  Eyes: Conjunctivae are normal.  Cardiovascular: Normal rate, regular rhythm, normal heart sounds and intact distal pulses.   Pulmonary/Chest: Effort normal and breath sounds normal. No respiratory distress. She has no wheezes. She has no rales. She exhibits  no tenderness.  Neurological: She is alert and oriented to person, place, and time.  Skin: Skin is warm and dry. No rash noted.  Psychiatric: Affect normal.  Vitals reviewed.  Assessment/Plan: Hypertension Improved but still above goal. Will check BMP. If potassium stable will increase HCTZ to 25 mg daily and start 20 meq of Klor con. Follow-up discussed. Follow-up with Cardiology as scheduled.     Leeanne Rio, PA-C

## 2017-07-22 NOTE — Progress Notes (Signed)
Pre visit review using our clinic review tool, if applicable. No additional management support is needed unless otherwise documented below in the visit note. 

## 2017-07-23 LAB — BASIC METABOLIC PANEL
BUN: 10 mg/dL (ref 7–25)
CALCIUM: 9.2 mg/dL (ref 8.6–10.4)
CO2: 25 mmol/L (ref 20–32)
Chloride: 103 mmol/L (ref 98–110)
Creat: 0.64 mg/dL (ref 0.50–1.05)
GLUCOSE: 78 mg/dL (ref 65–99)
Potassium: 3.7 mmol/L (ref 3.5–5.3)
Sodium: 140 mmol/L (ref 135–146)

## 2017-07-23 LAB — EXTRA LAV TOP TUBE

## 2017-07-23 NOTE — Assessment & Plan Note (Signed)
Improved but still above goal. Will check BMP. If potassium stable will increase HCTZ to 25 mg daily and start 20 meq of Klor con. Follow-up discussed. Follow-up with Cardiology as scheduled.

## 2017-07-24 ENCOUNTER — Other Ambulatory Visit: Payer: Self-pay | Admitting: Physician Assistant

## 2017-07-24 DIAGNOSIS — I10 Essential (primary) hypertension: Secondary | ICD-10-CM

## 2017-07-24 MED ORDER — HYDROCHLOROTHIAZIDE 25 MG PO TABS: 25.0000 mg | ORAL_TABLET | Freq: Every day | ORAL | 3 refills | Status: DC

## 2017-07-24 MED ORDER — POTASSIUM CHLORIDE CRYS ER 20 MEQ PO TBCR: 20.0000 meq | EXTENDED_RELEASE_TABLET | Freq: Every day | ORAL | 3 refills | Status: DC

## 2017-07-29 ENCOUNTER — Ambulatory Visit (INDEPENDENT_AMBULATORY_CARE_PROVIDER_SITE_OTHER): Payer: BC Managed Care – PPO | Admitting: Cardiology

## 2017-07-29 ENCOUNTER — Encounter: Payer: Self-pay | Admitting: Cardiology

## 2017-07-29 VITALS — BP 112/60 | HR 64 | Resp 10 | Ht 62.5 in | Wt 184.1 lb

## 2017-07-29 DIAGNOSIS — G4730 Sleep apnea, unspecified: Secondary | ICD-10-CM

## 2017-07-29 DIAGNOSIS — G4733 Obstructive sleep apnea (adult) (pediatric): Secondary | ICD-10-CM

## 2017-07-29 DIAGNOSIS — I10 Essential (primary) hypertension: Secondary | ICD-10-CM | POA: Diagnosis not present

## 2017-07-29 DIAGNOSIS — R7303 Prediabetes: Secondary | ICD-10-CM

## 2017-07-29 DIAGNOSIS — R9431 Abnormal electrocardiogram [ECG] [EKG]: Secondary | ICD-10-CM

## 2017-07-29 DIAGNOSIS — E782 Mixed hyperlipidemia: Secondary | ICD-10-CM

## 2017-07-29 HISTORY — DX: Sleep apnea, unspecified: G47.30

## 2017-07-29 NOTE — Progress Notes (Signed)
Cardiology Consultation:    Date:  07/29/2017   ID:  Kirby Funk, DOB 1961/07/27, MRN [id number]  PCP:  Brunetta Jeans, PA-C  Cardiologist:  Jenne Campus, MD   Referring MD: Brunetta Jeans, PA-C   Chief Complaint  Patient presents with  . Palpitations  . Heart Murmur  . Hypertension  hypertension  History of Present Illness:    Linda Kerr is a 56 y.o. female who is being seen today for the evaluation of hypertension at the request of Delorse Limber. Apparently about 6 months ago she was noted to have elevated   Blood pressure. She does not remember  Numbers, but her doctor was concerne and put her medications. She denies having any shortness of breath, chest pain. She used to exercise in the regular basis but lately she is not doing it. She is blinding and committed to return to her exercises. She does snore a lot at night. She also stop breathing at night. I have strong suspicion that she does have significant sleep apnea.  Past Medical History:  Diagnosis Date  . Allergy   . Anemia   . Anxiety   . Arthritis   . Bipolar disorder ([place])   . Depression   . Hyperlipidemia   . Sleep apnea 07/29/2017    Past Surgical History:  Procedure Laterality Date  . CESAREAN SECTION    . SIGMOIDOSCOPY      Current Medications: Current Meds  Medication Sig  . cyclobenzaprine (FLEXERIL) 10 MG tablet Take 1 tablet (10 mg total) by mouth 3 (three) times daily as needed for muscle spasms.  . divalproex (DEPAKOTE ER) 250 MG 24 hr tablet Take 750 mg by mouth at bedtime.  . fluticasone (FLONASE) 50 MCG/ACT nasal spray Place 2 sprays into both nostrils daily.  . hydrochlorothiazide (HYDRODIURIL) 25 MG tablet Take 1 tablet (25 mg total) by mouth daily.  Marland Kitchen levocetirizine (XYZAL) 5 MG tablet Take 1 tablet (5 mg total) by mouth every evening.  . montelukast (SINGULAIR) 10 MG tablet Take 1 tablet (10 mg total) by mouth at bedtime.  . norethindrone-ethinyl estradiol  (JINTELI) 1-5 MG-MCG TABS Take 1 tablet by mouth daily.  . potassium chloride SA (K-DUR,KLOR-CON) 20 MEQ tablet Take 1 tablet (20 mEq total) by mouth daily.  . pravastatin (PRAVACHOL) 20 MG tablet Take 1 tablet (20 mg total) by mouth daily.  . sertraline (ZOLOFT) 50 MG tablet Take 50 mg by mouth at bedtime.   Marland Kitchen zolpidem (AMBIEN) 10 MG tablet Take 10 mg by mouth at bedtime as needed for sleep.      Allergies:   Fish oil and Penicillins   Social History   Social History  . Marital status: Married    Spouse name: N/A  . Number of children: 1  . Years of education: N/A   Occupational History  . [employer]   Social History Main Topics  . Smoking status: Former Smoker    Quit date: 10/29/1988  . Smokeless tobacco: Never Used  . Alcohol use 0.0 oz/week     Comment: rare  . Drug use: No  . Sexual activity: Yes   Other Topics Concern  . None   Social History Narrative  . None     Family History: The patient's family history includes Colon cancer in her unknown relative; Coronary artery disease in her unknown relative; Diabetes in her father and unknown relative; Heart disease in her mother; Hypertension in her father,  mother, and unknown relative. ROS:   Please see the history of present illness.    All 14 point review of systems negative except as described per history of present illness.  EKGs/Labs/Other Studies Reviewed:    The following studies were reviewed today:   EKG:  EKG is  ordered today.  The ekg ordered today demonstrates Sinus rhythm, deep Q in V1 to V3 rising suspicion for old anterior septal wall marker infarction, no acute ST-T segment changes  Recent Labs: 12/06/2016: ALT 14; Hemoglobin 13.6; Platelets 216.0; TSH 1.98 07/22/2017: BUN 10; Creat 0.64; Potassium 3.7; Sodium 140  Recent Lipid Panel    Component Value Date/Time   CHOL 200 12/06/2016 0901   TRIG 95.0 12/06/2016 0901   HDL 45.60 12/06/2016 0901   CHOLHDL 4 12/06/2016  0901   VLDL 19.0 12/06/2016 0901   LDLCALC 135 (H) 12/06/2016 0901   LDLDIRECT 145.4 07/19/2010 0945    Physical Exam:    VS:  BP 112/60   Pulse 64   Resp 10   Ht 5' 2.5" (1.588 m)   Wt 184 lb 1.9 oz (83.5 kg)   BMI 33.14 kg/m     Wt Readings from Last 3 Encounters:  07/29/17 184 lb 1.9 oz (83.5 kg)  07/22/17 185 lb (83.9 kg)  07/08/17 185 lb (83.9 kg)     GEN:  Well nourished, well developed in no acute distress HEENT: Normal NECK: No JVD; No carotid bruits LYMPHATICS: No lymphadenopathy CARDIAC: RRR, no murmurs, no rubs, no gallops RESPIRATORY:  Clear to auscultation without rales, wheezing or rhonchi  ABDOMEN: Soft, non-tender, non-distended MUSCULOSKELETAL:  No edema; No deformity  SKIN: Warm and dry NEUROLOGIC:  Alert and oriented x 3 PSYCHIATRIC:  Normal affect   ASSESSMENT:    1. Essential hypertension   2. Mixed hyperlipidemia   3. Obstructive sleep apnea syndrome   4. Borderline diabetes    PLAN:    In order of problems listed above:  1. Essential hypertension: Her blood pressure is good today in the office. I asked her to purchase blood pressure monitor and check her blood pressure in the regular basis. I asked her to take BP and bring it to me when she'll be here next time. I will not change any of her medications right now. 2. I will ask her to have EKG as well as echocardiogram to see if there is an evidence of left ventricular hypertrophy. 3. Mixed dyslipidemia: We will investigate what the level of cholesterol with dealing with. Then she will be treated appropriately. 4. Obstructive sleep apnea: I suspect that this is the problem she has. I will schedule him to have a sleep study. 5. Borderline diabetes: Clearly she can benefit from doing exercises on regular basis as well as a good diet.   Medication Adjustments/Labs and Tests Ordered: Current medicines are reviewed at length with the patient today.  Concerns regarding medicines are outlined above.   No orders of the defined types were placed in this encounter.  No orders of the defined types were placed in this encounter.   Signed, Park Liter, MD, [facility]. 07/29/2017 2:44 PM    [facility]

## 2017-07-29 NOTE — Patient Instructions (Signed)
Medication Instructions:  Your physician recommends that you continue on your current medications as directed. Please refer to the Current Medication list given to you today.  Labwork: None   Testing/Procedures: Your physician has requested that you have an echocardiogram. Echocardiography is a painless test that uses sound waves to create images of your heart. It provides your doctor with information about the size and shape of your heart and how well your heart's chambers and valves are working. This procedure takes approximately one hour. There are no restrictions for this procedure.  Your physician has recommended that you have a sleep study. This test records several body functions during sleep, including: brain activity, eye movement, oxygen and carbon dioxide blood levels, heart rate and rhythm, breathing rate and rhythm, the flow of air through your mouth and nose, snoring, body muscle movements, and chest and belly movement.   Follow-Up: Your physician recommends that you schedule a follow-up appointment in: 1 month   Any Other Special Instructions Will Be Listed Below (If Applicable).  Please note that any paperwork needing to be filled out by the provider will need to be addressed at the front desk prior to seeing the provider. Please note that any paperwork FMLA, Disability or other documents regarding health condition is subject to a $25.00 charge that must be received prior to completion of paperwork in the form of a money order or check.     If you need a refill on your cardiac medications before your next appointment, please call your pharmacy.

## 2017-07-31 ENCOUNTER — Other Ambulatory Visit: Payer: Self-pay | Admitting: Emergency Medicine

## 2017-07-31 DIAGNOSIS — I10 Essential (primary) hypertension: Secondary | ICD-10-CM

## 2017-07-31 MED ORDER — HYDROCHLOROTHIAZIDE 25 MG PO TABS: 25.0000 mg | ORAL_TABLET | Freq: Every day | ORAL | 1 refills | Status: DC

## 2017-07-31 MED ORDER — PRAVASTATIN SODIUM 20 MG PO TABS: 20.0000 mg | ORAL_TABLET | Freq: Every day | ORAL | 0 refills | Status: DC

## 2017-08-06 ENCOUNTER — Telehealth (HOSPITAL_COMMUNITY): Payer: Self-pay | Admitting: *Deleted

## 2017-08-06 NOTE — Telephone Encounter (Signed)
Left message on voicemail per DPR in reference to upcoming appointment scheduled on 08/06/17 at [facility] with detailed instructions given per Myocardial Perfusion Study Information Sheet for the test. LM to arrive 15 minutes early, and that it is imperative to arrive on time for appointment to keep from having the test rescheduled. If you need to cancel or reschedule your appointment, please call the office within 24 hours of your appointment. Failure to do so may result in a cancellation of your appointment, and a $50 no show fee. Phone number given for call back for any questions.

## 2017-08-09 ENCOUNTER — Ambulatory Visit (HOSPITAL_COMMUNITY): Payer: BC Managed Care – PPO | Attending: Internal Medicine

## 2017-08-09 ENCOUNTER — Encounter (INDEPENDENT_AMBULATORY_CARE_PROVIDER_SITE_OTHER): Payer: Self-pay

## 2017-08-09 DIAGNOSIS — R9431 Abnormal electrocardiogram [ECG] [EKG]: Secondary | ICD-10-CM | POA: Diagnosis not present

## 2017-08-09 LAB — MYOCARDIAL PERFUSION IMAGING
CHL CUP MPHR: 166 {beats}/min
CHL CUP NUCLEAR SDS: 5
CHL CUP NUCLEAR SSS: 10
CSEPEW: 10.1 METS
CSEPPHR: 146 {beats}/min
Exercise duration (min): 9 min
LHR: 0.26
LV dias vol: 76 mL (ref 46–106)
LV sys vol: 24 mL
Percent HR: 86 %
RPE: 17
Rest HR: 67 {beats}/min
SRS: 5
TID: 0.98

## 2017-08-09 MED ORDER — TECHNETIUM TC 99M TETROFOSMIN IV KIT
30.7000 | PACK | Freq: Once | INTRAVENOUS | Status: AC | PRN
  Administered 2017-08-09: 30.7 via INTRAVENOUS
  Filled 2017-08-09: qty 31

## 2017-08-09 MED ORDER — TECHNETIUM TC 99M TETROFOSMIN IV KIT
10.3000 | PACK | Freq: Once | INTRAVENOUS | Status: AC | PRN
  Administered 2017-08-09: 10.3 via INTRAVENOUS
  Filled 2017-08-09: qty 11

## 2017-08-16 ENCOUNTER — Ambulatory Visit (HOSPITAL_BASED_OUTPATIENT_CLINIC_OR_DEPARTMENT_OTHER)
Admission: RE | Admit: 2017-08-16 | Discharge: 2017-08-16 | Disposition: A | Discharge status: Home / Self Care | Payer: BC Managed Care – PPO | Source: Ambulatory Visit | Attending: Cardiology | Admitting: Cardiology

## 2017-08-16 DIAGNOSIS — E119 Type 2 diabetes mellitus without complications: Secondary | ICD-10-CM | POA: Diagnosis not present

## 2017-08-16 DIAGNOSIS — G4733 Obstructive sleep apnea (adult) (pediatric): Secondary | ICD-10-CM | POA: Insufficient documentation

## 2017-08-16 DIAGNOSIS — E782 Mixed hyperlipidemia: Secondary | ICD-10-CM | POA: Insufficient documentation

## 2017-08-16 DIAGNOSIS — E785 Hyperlipidemia, unspecified: Secondary | ICD-10-CM | POA: Insufficient documentation

## 2017-08-16 DIAGNOSIS — I119 Hypertensive heart disease without heart failure: Secondary | ICD-10-CM | POA: Diagnosis present

## 2017-08-16 DIAGNOSIS — I10 Essential (primary) hypertension: Secondary | ICD-10-CM | POA: Diagnosis not present

## 2017-08-16 DIAGNOSIS — R7303 Prediabetes: Secondary | ICD-10-CM

## 2017-08-16 DIAGNOSIS — I071 Rheumatic tricuspid insufficiency: Secondary | ICD-10-CM | POA: Diagnosis not present

## 2017-08-16 LAB — ECHOCARDIOGRAM COMPLETE
CHL CUP MV DEC (S): 190
CHL CUP RV SYS PRESS: 25 mmHg
CHL CUP TV REG PEAK VELOCITY: 233 cm/s
E/e' ratio: 12.19
EWDT: 190 ms
FS: 39 % (ref 28–44)
IV/PV OW: 1.4
LA diam end sys: 32 mm
LA vol A4C: 46.9 ml
LA vol: 41.7 mL
LADIAMINDEX: 1.72 cm/m2
LASIZE: 32 mm
LAVOLIN: 22.4 mL/m2
LDCA: 3.14 cm2
LV E/e' medial: 12.19
LV PW d: 10.1 mm — AB (ref 0.6–1.1)
LV e' LATERAL: 7.4 cm/s
LVEEAVG: 12.19
LVOT VTI: 23.9 cm
LVOT peak vel: 106 cm/s
LVOTD: 20 mm
LVOTSV: 75 mL
Lateral S' vel: 16.3 cm/s
MV Peak grad: 3 mmHg
MV pk A vel: 86.3 m/s
MVPKEVEL: 90.2 m/s
TAPSE: 23.6 mm
TDI e' lateral: 7.4
TDI e' medial: 7.29
TRMAXVEL: 233 cm/s

## 2017-08-16 NOTE — Progress Notes (Signed)
  Echocardiogram 2D Echocardiogram has been performed.  Linda Kerr T [name] 08/16/2017, 3:57 PM

## 2017-08-19 ENCOUNTER — Encounter: Payer: Self-pay | Admitting: Physician Assistant

## 2017-08-19 ENCOUNTER — Ambulatory Visit (INDEPENDENT_AMBULATORY_CARE_PROVIDER_SITE_OTHER): Payer: BC Managed Care – PPO | Admitting: Physician Assistant

## 2017-08-19 VITALS — BP 140/80 | HR 74 | Temp 98.8°F | Resp 14 | Ht 63.0 in | Wt 185.0 lb

## 2017-08-19 DIAGNOSIS — I10 Essential (primary) hypertension: Secondary | ICD-10-CM | POA: Diagnosis not present

## 2017-08-19 NOTE — Progress Notes (Signed)
Patient presents to clinic today for follow-up of hypertension. At last visit, patient's regimen was changed to HCTZ 25 mg daily. Patient is taking medications as directed. Denies side effect of medication. Is taking potassium as directed. Patient has made significant changes in diet and exercise regimen. Is exercising. Is not checking BP at home at directed. Notes normal BP at Cardiology appointment. Patient denies chest pain, palpitations, lightheadedness, dizziness, vision changes or frequent headaches.  Past Medical History:  Diagnosis Date  . Allergy   . Anemia   . Anxiety   . Arthritis   . Bipolar disorder ([place])   . Depression   . Hyperlipidemia   . Sleep apnea 07/29/2017    Current Outpatient Prescriptions on File Prior to Visit  Medication Sig Dispense Refill  . cyclobenzaprine (FLEXERIL) 10 MG tablet Take 1 tablet (10 mg total) by mouth 3 (three) times daily as needed for muscle spasms. 30 tablet 0  . divalproex (DEPAKOTE ER) 250 MG 24 hr tablet Take 750 mg by mouth at bedtime.  11  . fluticasone (FLONASE) 50 MCG/ACT nasal spray Place 2 sprays into both nostrils daily. 16 g 1  . hydrochlorothiazide (HYDRODIURIL) 25 MG tablet Take 1 tablet (25 mg total) by mouth daily. 90 tablet 1  . levocetirizine (XYZAL) 5 MG tablet Take 1 tablet (5 mg total) by mouth every evening. 30 tablet 1  . montelukast (SINGULAIR) 10 MG tablet Take 1 tablet (10 mg total) by mouth at bedtime. 30 tablet 3  . norethindrone-ethinyl estradiol (JINTELI) 1-5 MG-MCG TABS Take 1 tablet by mouth daily.    . potassium chloride SA (K-DUR,KLOR-CON) 20 MEQ tablet Take 1 tablet (20 mEq total) by mouth daily. 30 tablet 3  . pravastatin (PRAVACHOL) 20 MG tablet Take 1 tablet (20 mg total) by mouth daily. 90 tablet 0  . sertraline (ZOLOFT) 50 MG tablet Take 50 mg by mouth at bedtime.     Marland Kitchen zolpidem (AMBIEN) 10 MG tablet Take 10 mg by mouth at bedtime as needed for sleep.      No current facility-administered medications  on file prior to visit.     Allergies  Allergen Reactions  . Fish Oil Rash  . Penicillins Rash    Family History  Problem Relation Age of Onset  . Hypertension Unknown   . Coronary artery disease Unknown   . Colon cancer Unknown   . Diabetes Unknown   . Heart disease Mother   . Hypertension Mother   . Diabetes Father   . Hypertension Father     Social History   Social History  . Marital status: Married    Spouse name: N/A  . Number of children: 1  . Years of education: N/A   Occupational History  . [employer]   Social History Main Topics  . Smoking status: Former Smoker    Quit date: 10/29/1988  . Smokeless tobacco: Never Used  . Alcohol use 0.0 oz/week     Comment: rare  . Drug use: No  . Sexual activity: Yes   Other Topics Concern  . None   Social History Narrative  . None   Review of Systems - See HPI.  All other ROS are negative.  BP 140/80   Pulse 74   Temp 98.8 F (37.1 C) (Oral)   Resp 14   Ht 5\' 3"  (1.6 m)   Wt 185 lb (83.9 kg)   SpO2 99%   BMI 32.77 kg/m   Physical Exam  Constitutional: She is oriented to person, place, and time and well-developed, well-nourished, and in no distress.  HENT:  Head: Normocephalic and atraumatic.  Eyes: Conjunctivae are normal.  Neck: Neck supple.  Cardiovascular: Normal rate, regular rhythm, normal heart sounds and intact distal pulses.   Pulmonary/Chest: Effort normal and breath sounds normal. No respiratory distress. She has no wheezes. She has no rales. She exhibits no tenderness.  Neurological: She is alert and oriented to person, place, and time.  Skin: Skin is warm and dry. No rash noted.  Psychiatric: Affect normal.  Vitals reviewed.  Recent Results (from the past 2160 hour(s))  Basic metabolic panel     Status: None   Collection Time: 07/22/17  4:35 PM  Result Value Ref Range   Glucose, Bld 78 65 - 99 mg/dL    Comment: .            Fasting reference interval .     BUN 10 7 - 25 mg/dL   Creat 0.64 0.50 - 1.05 mg/dL    Comment: For patients >35 years of age, the reference limit for Creatinine is approximately 13% higher for people identified as African-American. .    BUN/Creatinine Ratio NOT APPLICABLE 6 - 22 (calc)   Sodium 140 135 - 146 mmol/L   Potassium 3.7 3.5 - 5.3 mmol/L   Chloride 103 98 - 110 mmol/L   CO2 25 20 - 32 mmol/L   Calcium 9.2 8.6 - 10.4 mg/dL  EXTRA LAV TOP TUBE     Status: None   Collection Time: 07/22/17  4:35 PM  Result Value Ref Range   EXTRA LAVENDER-TOP TUBE      Comment: We received an extra specimen with no test requested. If any test is desired for this specimen please call client services and advise.   MYOCARDIAL PERFUSION IMAGING     Status: None   Collection Time: 08/09/17 11:17 AM  Result Value Ref Range   Rest HR 67 bpm   Rest BP 152/90 mmHg   RPE 17    Percent HR 86 %   Exercise duration (min) 9 min   Estimated workload 10.1 METS   Peak HR 146 bpm   Peak BP 223/94 mmHg   MPHR 166 bpm   SSS 10    SRS 5    SDS 5    LHR 0.26    TID 0.98    LV sys vol 24 mL   LV dias vol 76 46 - 106 mL  ECHOCARDIOGRAM COMPLETE     Status: Abnormal   Collection Time: 08/16/17  3:57 PM  Result Value Ref Range   LV PW d 10.1 (A) 0.6 - 1.1 mm   FS 39 28 - 44 %   LA vol 41.7 mL   LA ID, A-P, ES 32 mm   IVS/LV PW RATIO, ED 1.4    LVOT VTI 23.9 cm   Reg peak vel 233 cm/s   RV sys press 25 mmHg   LV e' LATERAL 7.4 cm/s   LV E/e' medial 12.19    LV E/e'average 12.19    LA diam index 1.72 cm/m2   LA vol A4C 46.9 ml   E decel time 190 msec   LVOT diameter 20 mm   LVOT area 3.14 cm2   LVOT peak vel 106 cm/s   LVOT SV 75.00 mL   Peak grad 3 mmHg   E/e' ratio 12.19    MV pk E vel 90.2 m/s   TR  max vel 233 cm/s   MV pk A vel 86.3 m/s   LA vol index 22.4 mL/m2   MV Dec 190    LA diam end sys 32.00 mm   TDI e' medial 7.29    TDI e' lateral 7.40    Lateral S' vel 16.30 cm/sec   TAPSE 23.60 mm    Assessment/Plan: Hypertension BP slightly above goal today. Just got in from work. BP at Cardiology appt was normotensive. Continue current regimen. BMP today. Patient to get home BP cuff and check daily. Record and call 1 week with numbers.    Leeanne Rio, PA-C

## 2017-08-19 NOTE — Progress Notes (Signed)
Pre visit review using our clinic review tool, if applicable. No additional management support is needed unless otherwise documented below in the visit note. 

## 2017-08-19 NOTE — Patient Instructions (Addendum)
Please stay well-hydrated and get plenty of rest.  Continue diet and exercise. Ok to start the meal replacement shakes.  Continue medications as directed as BP at Cardiology was normal. Get a home BP cuff and check daily. Write down and call me with one results.   Stop by the lab before you leave. We are rechecking your potassium levels today.   Follow-up in 3 months.    DASH Eating Plan DASH stands for "Dietary Approaches to Stop Hypertension." The DASH eating plan is a healthy eating plan that has been shown to reduce high blood pressure (hypertension). It may also reduce your risk for type 2 diabetes, heart disease, and stroke. The DASH eating plan may also help with weight loss. What are tips for following this plan? General guidelines  Avoid eating more than 2,300 mg (milligrams) of salt (sodium) a day. If you have hypertension, you may need to reduce your sodium intake to 1,500 mg a day.  Limit alcohol intake to no more than 1 drink a day for nonpregnant women and 2 drinks a day for men. One drink equals 12 oz of beer, 5 oz of wine, or 1 oz of hard liquor.  Work with your health care provider to maintain a healthy body weight or to lose weight. Ask what an ideal weight is for you.  Get at least 30 minutes of exercise that causes your heart to beat faster (aerobic exercise) most days of the week. Activities may include walking, swimming, or biking.  Work with your health care provider or diet and nutrition specialist (dietitian) to adjust your eating plan to your individual calorie needs. Reading food labels  Check food labels for the amount of sodium per serving. Choose foods with less than 5 percent of the Daily Value of sodium. Generally, foods with less than 300 mg of sodium per serving fit into this eating plan.  To find whole grains, look for the word "whole" as the first word in the ingredient list. Shopping  Buy products labeled as "low-sodium" or "no salt added."  Buy  fresh foods. Avoid canned foods and premade or frozen meals. Cooking  Avoid adding salt when cooking. Use salt-free seasonings or herbs instead of table salt or sea salt. Check with your health care provider or pharmacist before using salt substitutes.  Do not fry foods. Cook foods using healthy methods such as baking, boiling, grilling, and broiling instead.  Cook with heart-healthy oils, such as olive, canola, soybean, or sunflower oil. Meal planning   Eat a balanced diet that includes: ? 5 or more servings of fruits and vegetables each day. At each meal, try to fill half of your plate with fruits and vegetables. ? Up to 6-8 servings of whole grains each day. ? Less than 6 oz of lean meat, poultry, or fish each day. A 3-oz serving of meat is about the same size as a deck of cards. One egg equals 1 oz. ? 2 servings of low-fat dairy each day. ? A serving of nuts, seeds, or beans 5 times each week. ? Heart-healthy fats. Healthy fats called Omega-3 fatty acids are found in foods such as flaxseeds and coldwater fish, like sardines, salmon, and mackerel.  Limit how much you eat of the following: ? Canned or prepackaged foods. ? Food that is high in trans fat, such as fried foods. ? Food that is high in saturated fat, such as fatty meat. ? Sweets, desserts, sugary drinks, and other foods with added sugar. ?  Full-fat dairy products.  Do not salt foods before eating.  Try to eat at least 2 vegetarian meals each week.  Eat more home-cooked food and less restaurant, buffet, and fast food.  When eating at a restaurant, ask that your food be prepared with less salt or no salt, if possible. What foods are recommended? The items listed may not be a complete list. Talk with your dietitian about what dietary choices are best for you. Grains Whole-grain or whole-wheat bread. Whole-grain or whole-wheat pasta. Brown rice. [place]. Bulgur. Whole-grain and low-sodium cereals. Pita bread.  Low-fat, low-sodium crackers. Whole-wheat flour tortillas. Vegetables Fresh or frozen vegetables (raw, steamed, roasted, or grilled). Low-sodium or reduced-sodium tomato and vegetable juice. Low-sodium or reduced-sodium tomato sauce and tomato paste. Low-sodium or reduced-sodium canned vegetables. Fruits All fresh, dried, or frozen fruit. Canned fruit in natural juice (without added sugar). Meat and other protein foods Skinless chicken or [place]. Ground chicken or [place]. Pork with fat trimmed off. Fish and seafood. Egg whites. Dried beans, peas, or lentils. Unsalted nuts, nut butters, and seeds. Unsalted canned beans. Lean cuts of beef with fat trimmed off. Low-sodium, lean deli meat. Dairy Low-fat (1%) or fat-free (skim) milk. Fat-free, low-fat, or reduced-fat cheeses. Nonfat, low-sodium ricotta or cottage cheese. Low-fat or nonfat yogurt. Low-fat, low-sodium cheese. Fats and oils Soft margarine without trans fats. Vegetable oil. Low-fat, reduced-fat, or light mayonnaise and salad dressings (reduced-sodium). Canola, safflower, olive, soybean, and sunflower oils. Avocado. Seasoning and other foods Herbs. Spices. Seasoning mixes without salt. Unsalted popcorn and pretzels. Fat-free sweets. What foods are not recommended? The items listed may not be a complete list. Talk with your dietitian about what dietary choices are best for you. Grains Baked goods made with fat, such as croissants, muffins, or some breads. Dry pasta or rice meal packs. Vegetables Creamed or fried vegetables. Vegetables in a cheese sauce. Regular canned vegetables (not low-sodium or reduced-sodium). Regular canned tomato sauce and paste (not low-sodium or reduced-sodium). Regular tomato and vegetable juice (not low-sodium or reduced-sodium). Angie Fava. Olives. Fruits Canned fruit in a light or heavy syrup. Fried fruit. Fruit in cream or butter sauce. Meat and other protein foods Fatty cuts of meat. Ribs. Fried meat. Berniece Salines.  Sausage. Bologna and other processed lunch meats. Salami. Fatback. Hotdogs. Bratwurst. Salted nuts and seeds. Canned beans with added salt. Canned or smoked fish. Whole eggs or egg yolks. Chicken or [place] with skin. Dairy Whole or 2% milk, cream, and half-and-half. Whole or full-fat cream cheese. Whole-fat or sweetened yogurt. Full-fat cheese. Nondairy creamers. Whipped toppings. Processed cheese and cheese spreads. Fats and oils Butter. Stick margarine. Lard. Shortening. Ghee. Bacon fat. Tropical oils, such as coconut, palm kernel, or palm oil. Seasoning and other foods Salted popcorn and pretzels. Onion salt, garlic salt, seasoned salt, table salt, and sea salt. Worcestershire sauce. Tartar sauce. Barbecue sauce. Teriyaki sauce. Soy sauce, including reduced-sodium. Steak sauce. Canned and packaged gravies. Fish sauce. Oyster sauce. Cocktail sauce. Horseradish that you find on the shelf. Ketchup. Mustard. Meat flavorings and tenderizers. Bouillon cubes. Hot sauce and Tabasco sauce. Premade or packaged marinades. Premade or packaged taco seasonings. Relishes. Regular salad dressings. Where to find more information:  National Heart, Lung, and [place]: https://wilson-eaton.com/  American Heart Association: www.heart.org Summary  The DASH eating plan is a healthy eating plan that has been shown to reduce high blood pressure (hypertension). It may also reduce your risk for type 2 diabetes, heart disease, and stroke.  With the DASH eating plan, you  should limit salt (sodium) intake to 2,300 mg a day. If you have hypertension, you may need to reduce your sodium intake to 1,500 mg a day.  When on the DASH eating plan, aim to eat more fresh fruits and vegetables, whole grains, lean proteins, low-fat dairy, and heart-healthy fats.  Work with your health care provider or diet and nutrition specialist (dietitian) to adjust your eating plan to your individual calorie needs. This information is not intended  to replace advice given to you by your health care provider. Make sure you discuss any questions you have with your health care provider. Document Released: 10/04/2011 Document Revised: 10/08/2016 Document Reviewed: 10/08/2016 Elsevier Interactive Patient Education  2017 [employer].

## 2017-08-19 NOTE — Assessment & Plan Note (Signed)
BP slightly above goal today. Just got in from work. BP at Cardiology appt was normotensive. Continue current regimen. BMP today. Patient to get home BP cuff and check daily. Record and call 1 week with numbers.

## 2017-08-20 LAB — BASIC METABOLIC PANEL
BUN: 14 mg/dL (ref 7–25)
CHLORIDE: 102 mmol/L (ref 98–110)
CO2: 29 mmol/L (ref 20–32)
CREATININE: 0.66 mg/dL (ref 0.50–1.05)
Calcium: 9.5 mg/dL (ref 8.6–10.4)
Glucose, Bld: 83 mg/dL (ref 65–99)
Potassium: 3.7 mmol/L (ref 3.5–5.3)
Sodium: 139 mmol/L (ref 135–146)

## 2017-08-20 LAB — EXTRA LAV TOP TUBE

## 2017-08-27 ENCOUNTER — Other Ambulatory Visit: Payer: Self-pay | Admitting: Emergency Medicine

## 2017-08-27 MED ORDER — FLUTICASONE PROPIONATE 50 MCG/ACT NA SUSP: 2.0000 | Freq: Every day | NASAL | 3 refills | Status: DC

## 2017-08-29 ENCOUNTER — Encounter: Payer: Self-pay | Admitting: Cardiology

## 2017-08-29 ENCOUNTER — Ambulatory Visit (INDEPENDENT_AMBULATORY_CARE_PROVIDER_SITE_OTHER): Payer: BC Managed Care – PPO | Admitting: Cardiology

## 2017-08-29 VITALS — BP 140/82 | HR 76 | Resp 14 | Ht 62.5 in | Wt 184.0 lb

## 2017-08-29 DIAGNOSIS — I10 Essential (primary) hypertension: Secondary | ICD-10-CM | POA: Diagnosis not present

## 2017-08-29 DIAGNOSIS — R7303 Prediabetes: Secondary | ICD-10-CM | POA: Diagnosis not present

## 2017-08-29 DIAGNOSIS — G4733 Obstructive sleep apnea (adult) (pediatric): Secondary | ICD-10-CM | POA: Diagnosis not present

## 2017-08-29 DIAGNOSIS — E782 Mixed hyperlipidemia: Secondary | ICD-10-CM | POA: Diagnosis not present

## 2017-08-29 NOTE — Progress Notes (Signed)
Cardiology Office Note:    Date:  08/29/2017   ID:  Linda Kerr, DOB 1960-12-07, MRN [id number]  PCP:  Brunetta Jeans, PA-C  Cardiologist:  Jenne Campus, MD    Referring MD: Brunetta Jeans, PA-C   Chief Complaint  Patient presents with  . 1 month follow up  I am doing well  History of Present Illness:    Linda Kerr is a 56 y.o. female with hypertension.  She also has some atypical symptoms that required investigation.  She did have echocardiogram which showed preserved left ventricular ejection fraction, no significant valvular pathology.  She does have mild degree of left ventricle hypertrophy.  Stress test was done she walked quite well on the treadmill she did have hypertensive response but no evidence of ischemia.  We talked in length about what to do with the situation.  I think the key point right now is to modify her risk factors for coronary artery disease and future coronary events.  She is committed to exercise on a regular basis and I gave her some hints how to do it.  She is also changing her diet and a very positive weight which should help a lot.  I would like to see her back in my office in about 3 months to see how she is doing and give her some more encouragements.  She does have blood pressure monitor when she check blood pressure at home is usually 120/80.  I asked her to keep monitoring it taken out of it and showed to me when she will be here next time.  I suspect she does have significant sleep apnea but she is committed to lose weight therefore I will not schedule her to have a sleep study until she will reach her target weight.  Past Medical History:  Diagnosis Date  . Allergy   . Anemia   . Anxiety   . Arthritis   . Bipolar disorder ([place])   . Depression   . Hyperlipidemia   . Sleep apnea 07/29/2017    Past Surgical History:  Procedure Laterality Date  . CESAREAN SECTION    . SIGMOIDOSCOPY      Current Medications: Current Meds  Medication  Sig  . cyclobenzaprine (FLEXERIL) 10 MG tablet Take 1 tablet (10 mg total) by mouth 3 (three) times daily as needed for muscle spasms.  . divalproex (DEPAKOTE ER) 250 MG 24 hr tablet Take 750 mg by mouth at bedtime.  . fluticasone (FLONASE) 50 MCG/ACT nasal spray Place 2 sprays into both nostrils daily.  . hydrochlorothiazide (HYDRODIURIL) 25 MG tablet Take 1 tablet (25 mg total) by mouth daily.  Marland Kitchen levocetirizine (XYZAL) 5 MG tablet Take 1 tablet (5 mg total) by mouth every evening.  . montelukast (SINGULAIR) 10 MG tablet Take 1 tablet (10 mg total) by mouth at bedtime.  . norethindrone-ethinyl estradiol (JINTELI) 1-5 MG-MCG TABS Take 1 tablet by mouth daily.  . potassium chloride SA (K-DUR,KLOR-CON) 20 MEQ tablet Take 1 tablet (20 mEq total) by mouth daily.  . pravastatin (PRAVACHOL) 20 MG tablet Take 1 tablet (20 mg total) by mouth daily.  . sertraline (ZOLOFT) 50 MG tablet Take 50 mg by mouth at bedtime.   Marland Kitchen zolpidem (AMBIEN) 10 MG tablet Take 10 mg by mouth at bedtime as needed for sleep.      Allergies:   Fish oil and Penicillins   Social History   Social History  . Marital status: Married    Spouse name:  N/A  . Number of children: 1  . Years of education: N/A   Occupational History  . [employer]   Social History Main Topics  . Smoking status: Former Smoker    Quit date: 10/29/1988  . Smokeless tobacco: Never Used  . Alcohol use 0.0 oz/week     Comment: rare  . Drug use: No  . Sexual activity: Yes   Other Topics Concern  . None   Social History Narrative  . None     Family History: The patient's family history includes Colon cancer in her unknown relative; Coronary artery disease in her unknown relative; Diabetes in her father and unknown relative; Heart disease in her mother; Hypertension in her father, mother, and unknown relative. ROS:   Please see the history of present illness.    All 14 point review of systems negative except as  described per history of present illness  EKGs/Labs/Other Studies Reviewed:      Recent Labs: 12/06/2016: ALT 14; Hemoglobin 13.6; Platelets 216.0; TSH 1.98 08/19/2017: BUN 14; Creat 0.66; Potassium 3.7; Sodium 139  Recent Lipid Panel    Component Value Date/Time   CHOL 200 12/06/2016 0901   TRIG 95.0 12/06/2016 0901   HDL 45.60 12/06/2016 0901   CHOLHDL 4 12/06/2016 0901   VLDL 19.0 12/06/2016 0901   LDLCALC 135 (H) 12/06/2016 0901   LDLDIRECT 145.4 07/19/2010 0945    Physical Exam:    VS:  BP 140/82   Pulse 76   Resp 14   Ht 5' 2.5" (1.588 m)   Wt 184 lb (83.5 kg)   BMI 33.12 kg/m     Wt Readings from Last 3 Encounters:  08/29/17 184 lb (83.5 kg)  08/19/17 185 lb (83.9 kg)  07/29/17 184 lb 1.9 oz (83.5 kg)     GEN:  Well nourished, well developed in no acute distress HEENT: Normal NECK: No JVD; No carotid bruits LYMPHATICS: No lymphadenopathy CARDIAC: RRR, no murmurs, no rubs, no gallops RESPIRATORY:  Clear to auscultation without rales, wheezing or rhonchi  ABDOMEN: Soft, non-tender, non-distended MUSCULOSKELETAL:  No edema; No deformity  SKIN: Warm and dry LOWER EXTREMITIES: no swelling NEUROLOGIC:  Alert and oriented x 3 PSYCHIATRIC:  Normal affect   ASSESSMENT:    1. Essential hypertension   2. Borderline diabetes   3. Mixed hyperlipidemia   4. Obstructive sleep apnea syndrome    PLAN:    In order of problems listed above:  1. Essential hypertension: Blood pressure appears to be fairly controlled but still required monitoring and probably in the future adjustment of her medications. 2. Borderline diabetes: Exercising regular basis should help she is committed to do it. 3. Mixed dyslipidemia: She is on atorvastatin which I will continue. 4. Potential obstructive sleep apnea: Will wait with sleep study since she is trying to lose weight.   Medication Adjustments/Labs and Tests Ordered: Current medicines are reviewed at length with the patient  today.  Concerns regarding medicines are outlined above.  No orders of the defined types were placed in this encounter.  Medication changes: No orders of the defined types were placed in this encounter.   Signed, Park Liter, MD, [facility] 08/29/2017 3:42 PM    [place]

## 2017-08-29 NOTE — Patient Instructions (Signed)
Medication Instructions:  Your physician recommends that you continue on your current medications as directed. Please refer to the Current Medication list given to you today.  1. Avoid all over-the-counter antihistamines except Claritin/Loratadine and Zyrtec/Cetrizine. 2. Avoid all combination including cold sinus allergies flu decongestant and sleep medications 3. You can use Robitussin DM Mucinex and Mucinex DM for cough. 4. can use Tylenol aspirin ibuprofen and naproxen but no combinations such as sleep or sinus.  Labwork: None    Testing/Procedures: None   Follow-Up: Your physician recommends that you schedule a follow-up appointment in: 3 months  Any Other Special Instructions Will Be Listed Below (If Applicable).  Please note that any paperwork needing to be filled out by the provider will need to be addressed at the front desk prior to seeing the provider. Please note that any paperwork FMLA, Disability or other documents regarding health condition is subject to a $25.00 charge that must be received prior to completion of paperwork in the form of a money order or check.    If you need a refill on your cardiac medications before your next appointment, please call your pharmacy.  

## 2017-10-08 ENCOUNTER — Other Ambulatory Visit: Payer: Self-pay | Admitting: Physician Assistant

## 2017-10-18 ENCOUNTER — Other Ambulatory Visit: Payer: Self-pay | Admitting: General Practice

## 2017-10-18 DIAGNOSIS — I10 Essential (primary) hypertension: Secondary | ICD-10-CM

## 2017-10-18 MED ORDER — POTASSIUM CHLORIDE CRYS ER 20 MEQ PO TBCR: 20.0000 meq | EXTENDED_RELEASE_TABLET | Freq: Every day | ORAL | 0 refills | Status: DC

## 2017-10-18 MED ORDER — MONTELUKAST SODIUM 10 MG PO TABS: ORAL_TABLET | ORAL | 0 refills | Status: DC

## 2017-10-28 ENCOUNTER — Ambulatory Visit: Payer: Self-pay

## 2017-10-28 ENCOUNTER — Telehealth: Payer: Self-pay | Admitting: Physician Assistant

## 2017-10-28 NOTE — Telephone Encounter (Signed)
Patient called in with c/o "swelling." Patient denies swelling at present time, but says she can still feel the tightness at times. She says her swelling has been off and on x 2 weeks and it was in her Rt arm and hand, upper and lower arm, Right lower leg near ankle and foot. She reports having some "red, mosquito looking bites to her ankle that has faded away." She says the swelling, when it appears, is mild, but she still feels the tightness. Denies SOB, chest pain at present or during the swelling episodes. She reports being on a new birth control medication and thinks it's related to this. According to the protocol, see PCP within 3 days, appointment already made for 10/30/17, care advice given, verbalized understanding.  Reason for Disposition . [1] MILD swelling of both ankles (i.e., pedal edema) AND [2] new onset or worsening  Answer Assessment - Initial Assessment Questions 1. ONSET: "When did the swelling start?" (e.g., minutes, hours, days)     Off and on about 2 weeks ago  2. LOCATION: "What part of the leg is swollen?"  "Are both legs swollen or just one leg?"     Bottom of right leg/foot, right arm/hand 3. SEVERITY: "How bad is the swelling?" (e.g., localized; mild, moderate, severe)  - Localized - small area of swelling localized to one leg  - MILD pedal edema - swelling limited to foot and ankle, pitting edema < 1/4 inch (6 mm) deep, rest and elevation eliminate most or all swelling  - MODERATE edema - swelling of lower leg to knee, pitting edema > 1/4 inch (6 mm) deep, rest and elevation only partially reduce swelling  - SEVERE edema - swelling extends above knee, facial or hand swelling present      Mild tightness foot, and ankle to under calf; Rt upper/lower arm and hand 4. REDNESS: "Does the swelling look red or infected?"     Slight red spots right ankle 5. PAIN: "Is the swelling painful to touch?" If so, ask: "How painful is it?"   (Scale 1-10; mild, moderate or severe)  Denies pain 6. FEVER: "Do you have a fever?" If so, ask: "What is it, how was it measured, and when did it start?"      No 7. CAUSE: "What do you think is causing the leg swelling?"     Possibly the new medication-Fyavolv 1 mg 8. MEDICAL HISTORY: "Do you have a history of heart failure, kidney disease, liver failure, or cancer?"     No 9. RECURRENT SYMPTOM: "Have you had leg swelling before?" If so, ask: "When was the last time?" "What happened that time?"     Denies 10. OTHER SYMPTOMS: "Do you have any other symptoms?" (e.g., chest pain, difficulty breathing)       Denies 11. PREGNANCY: "Is there any chance you are pregnant?" "When was your last menstrual period?"       N/A  Protocols used: LEG SWELLING AND EDEMA-A-AH

## 2017-10-28 NOTE — Telephone Encounter (Signed)
Copied from [place]. Topic: General - Other >> Oct 28, 2017  4:16 PM Cecelia Byars, NT wrote: Reason for CRM: Patient called is concerned about,the way her body is reacting to  medication, estrogen right leg has been swelling off and on,

## 2017-10-30 ENCOUNTER — Ambulatory Visit (INDEPENDENT_AMBULATORY_CARE_PROVIDER_SITE_OTHER): Payer: BC Managed Care – PPO | Admitting: Physician Assistant

## 2017-10-30 ENCOUNTER — Encounter: Payer: Self-pay | Admitting: Physician Assistant

## 2017-10-30 VITALS — BP 140/84 | HR 68 | Temp 98.6°F | Resp 14 | Ht 63.0 in | Wt 184.0 lb

## 2017-10-30 DIAGNOSIS — R202 Paresthesia of skin: Secondary | ICD-10-CM

## 2017-10-30 NOTE — Progress Notes (Signed)
Patient presents to clinic today c/o intermittent sensation of swelling with tingling and decreased sensation of legs bilaterally with R>L. Notes some sensation in hands as well. Denies fever, chills, true swelling of extremities. Denies trauma or injury. Denies change in coloration of skin. Is not a smoker. No history of diabetes or neuropathy. Denies known family history of neuropathy. Denies history of b12 deficiency.   Past Medical History:  Diagnosis Date  . Allergy   . Anemia   . Anxiety   . Arthritis   . Bipolar disorder ([place])   . Depression   . Hyperlipidemia   . Sleep apnea 07/29/2017    Current Outpatient Medications on File Prior to Visit  Medication Sig Dispense Refill  . cyclobenzaprine (FLEXERIL) 10 MG tablet Take 1 tablet (10 mg total) by mouth 3 (three) times daily as needed for muscle spasms. 30 tablet 0  . divalproex (DEPAKOTE ER) 250 MG 24 hr tablet Take 750 mg by mouth at bedtime.  11  . fluticasone (FLONASE) 50 MCG/ACT nasal spray Place 2 sprays into both nostrils daily. 16 g 3  . hydrochlorothiazide (HYDRODIURIL) 25 MG tablet Take 1 tablet (25 mg total) by mouth daily. 90 tablet 1  . levocetirizine (XYZAL) 5 MG tablet Take 1 tablet (5 mg total) by mouth every evening. 30 tablet 1  . montelukast (SINGULAIR) 10 MG tablet TAKE 1 TABLET BY MOUTH EVERYDAY AT BEDTIME 90 tablet 0  . potassium chloride SA (K-DUR,KLOR-CON) 20 MEQ tablet Take 1 tablet (20 mEq total) by mouth daily. 90 tablet 0  . pravastatin (PRAVACHOL) 20 MG tablet Take 1 tablet (20 mg total) by mouth daily. 90 tablet 0  . sertraline (ZOLOFT) 50 MG tablet Take 50 mg by mouth at bedtime.     Marland Kitchen zolpidem (AMBIEN) 10 MG tablet Take 10 mg by mouth at bedtime as needed for sleep.     . norethindrone-ethinyl estradiol (JINTELI) 1-5 MG-MCG TABS Take 1 tablet by mouth daily.     No current facility-administered medications on file prior to visit.     Allergies  Allergen Reactions  . Fish Oil Rash  .  Penicillins Rash    Family History  Problem Relation Age of Onset  . Hypertension Unknown   . Coronary artery disease Unknown   . Colon cancer Unknown   . Diabetes Unknown   . Heart disease Mother   . Hypertension Mother   . Diabetes Father   . Hypertension Father     Social History   Socioeconomic History  . Marital status: Married    Spouse name: None  . Number of children: 1  . Years of education: None  . Highest education level: None  Social Needs  . Financial resource strain: None  . Food insecurity - worry: None  . Food insecurity - inability: None  . Transportation needs - medical: None  . Transportation needs - non-medical: None  Occupational History  . Occupation: SCHOOL Nurse, adult: [employer]  Tobacco Use  . Smoking status: Former Smoker    Last attempt to quit: 10/29/1988    Years since quitting: 29.0  . Smokeless tobacco: Never Used  Substance and Sexual Activity  . Alcohol use: Yes    Alcohol/week: 0.0 oz    Comment: rare  . Drug use: No  . Sexual activity: Yes  Other Topics Concern  . None  Social History Narrative  . None   Review of Systems - See HPI.  All other  ROS are negative.  BP 140/84   Pulse 68   Temp 98.6 F (37 C) (Oral)   Resp 14   Ht 5\' 3"  (1.6 m)   Wt 184 lb (83.5 kg)   SpO2 98%   BMI 32.59 kg/m   Physical Exam  Constitutional: She is oriented to person, place, and time and well-developed, well-nourished, and in no distress.  HENT:  Head: Normocephalic and atraumatic.  Eyes: Conjunctivae are normal.  Neck: Neck supple.  Cardiovascular: Normal rate, regular rhythm, normal heart sounds and intact distal pulses.  Pulses:      Dorsalis pedis pulses are 2+ on the right side, and 2+ on the left side.       Posterior tibial pulses are 2+ on the right side, and 2+ on the left side.  Negative for edema of upper or lower extremities bilaterally.  Pulmonary/Chest: Effort normal and breath sounds normal. No  respiratory distress. She has no wheezes. She has no rales. She exhibits no tenderness.  Neurological: She is alert and oriented to person, place, and time. She has normal sensation.  Skin: Skin is warm and dry. No rash noted.  Psychiatric: Affect normal.  Vitals reviewed.   Recent Results (from the past 2160 hour(s))  MYOCARDIAL PERFUSION IMAGING     Status: None   Collection Time: 08/09/17 11:17 AM  Result Value Ref Range   Rest HR 67 bpm   Rest BP 152/90 mmHg   RPE 17    Percent HR 86 %   Exercise duration (min) 9 min   Estimated workload 10.1 METS   Peak HR 146 bpm   Peak BP 223/94 mmHg   MPHR 166 bpm   SSS 10    SRS 5    SDS 5    LHR 0.26    TID 0.98    LV sys vol 24 mL   LV dias vol 76 46 - 106 mL  ECHOCARDIOGRAM COMPLETE     Status: Abnormal   Collection Time: 08/16/17  3:57 PM  Result Value Ref Range   LV PW d 10.1 (A) 0.6 - 1.1 mm   FS 39 28 - 44 %   LA vol 41.7 mL   LA ID, A-P, ES 32 mm   IVS/LV PW RATIO, ED 1.4    LVOT VTI 23.9 cm   Reg peak vel 233 cm/s   RV sys press 25 mmHg   LV e' LATERAL 7.4 cm/s   LV E/e' medial 12.19    LV E/e'average 12.19    LA diam index 1.72 cm/m2   LA vol A4C 46.9 ml   E decel time 190 msec   LVOT diameter 20 mm   LVOT area 3.14 cm2   LVOT peak vel 106 cm/s   LVOT SV 75.00 mL   Peak grad 3 mmHg   E/e' ratio 12.19    MV pk E vel 90.2 m/s   TR max vel 233 cm/s   MV pk A vel 86.3 m/s   LA vol index 22.4 mL/m2   MV Dec 190    LA diam end sys 32.00 mm   TDI e' medial 7.29    TDI e' lateral 7.40    Lateral S' vel 16.30 cm/sec   TAPSE 23.60 mm  Basic metabolic panel     Status: None   Collection Time: 08/19/17  4:08 PM  Result Value Ref Range   Glucose, Bld 83 65 - 99 mg/dL    Comment: .  Fasting reference interval .    BUN 14 7 - 25 mg/dL   Creat 0.66 0.50 - 1.05 mg/dL    Comment: For patients >16 years of age, the reference limit for Creatinine is approximately 13% higher for people identified as  African-American. .    BUN/Creatinine Ratio NOT APPLICABLE 6 - 22 (calc)   Sodium 139 135 - 146 mmol/L   Potassium 3.7 3.5 - 5.3 mmol/L   Chloride 102 98 - 110 mmol/L   CO2 29 20 - 32 mmol/L   Calcium 9.5 8.6 - 10.4 mg/dL  EXTRA LAV TOP TUBE     Status: None   Collection Time: 08/19/17  4:08 PM  Result Value Ref Range   EXTRA LAVENDER-TOP TUBE      Comment: We received an extra specimen with no test requested. If any test is desired for this specimen please call client services and advise.     Assessment/Plan: 1. Paresthesias Neuro examination unremarkable. No objective numbness on examination today. Will check lab panel. No history of diabetes. Start B complex vitamin. If all normal, may need neurology referral for further assessment.  - CBC w/Diff; Future - Sedimentation rate; Future - B12 and Folate Panel; Future - TSH   Leeanne Rio, PA-C

## 2017-10-30 NOTE — Telephone Encounter (Signed)
Patient has appt today for evaluation.

## 2017-10-30 NOTE — Patient Instructions (Addendum)
Please stay well-hydrated. Eat a well-balanced diet. Stay off of the OCP for now. You will need to discuss a new option with GYN. If symptoms improve significantly off of medication, we know it is the culprit.  Call Dr. Casimiro Needle office to schedule a close follow-up for worsening mood.  Please come in tomorrow AM for labs. We are getting a further assessment.  I will call with lab results and next steps.  ER for any worsening symptoms.

## 2017-10-31 ENCOUNTER — Other Ambulatory Visit (INDEPENDENT_AMBULATORY_CARE_PROVIDER_SITE_OTHER): Payer: BC Managed Care – PPO

## 2017-10-31 ENCOUNTER — Other Ambulatory Visit: Payer: BC Managed Care – PPO

## 2017-10-31 ENCOUNTER — Encounter: Payer: Self-pay | Admitting: *Deleted

## 2017-10-31 DIAGNOSIS — R202 Paresthesia of skin: Secondary | ICD-10-CM

## 2017-10-31 LAB — CBC WITH DIFFERENTIAL/PLATELET
BASOS PCT: 0.5 % (ref 0.0–3.0)
Basophils Absolute: 0.1 10*3/uL (ref 0.0–0.1)
EOS PCT: 1.2 % (ref 0.0–5.0)
Eosinophils Absolute: 0.1 10*3/uL (ref 0.0–0.7)
HEMATOCRIT: 41 % (ref 36.0–46.0)
HEMOGLOBIN: 13.5 g/dL (ref 12.0–15.0)
LYMPHS PCT: 52 % — AB (ref 12.0–46.0)
Lymphs Abs: 5.5 10*3/uL — ABNORMAL HIGH (ref 0.7–4.0)
MCHC: 33 g/dL (ref 30.0–36.0)
MCV: 94 fl (ref 78.0–100.0)
Monocytes Absolute: 0.9 10*3/uL (ref 0.1–1.0)
Monocytes Relative: 8.5 % (ref 3.0–12.0)
Neutro Abs: 4 10*3/uL (ref 1.4–7.7)
Neutrophils Relative %: 37.8 % — ABNORMAL LOW (ref 43.0–77.0)
Platelets: 233 10*3/uL (ref 150.0–400.0)
RBC: 4.36 Mil/uL (ref 3.87–5.11)
RDW: 13.2 % (ref 11.5–15.5)
WBC: 10.6 10*3/uL — AB (ref 4.0–10.5)

## 2017-10-31 LAB — B12 AND FOLATE PANEL
Folate: 11.5 ng/mL (ref >5.9)
Vitamin B-12: 649 pg/mL (ref 211–911)

## 2017-10-31 LAB — SEDIMENTATION RATE: SED RATE: 27 mm/h (ref 0–30)

## 2017-11-01 ENCOUNTER — Other Ambulatory Visit: Payer: Self-pay | Admitting: Physician Assistant

## 2017-11-01 DIAGNOSIS — R202 Paresthesia of skin: Secondary | ICD-10-CM | POA: Insufficient documentation

## 2017-11-18 ENCOUNTER — Encounter: Payer: Self-pay | Admitting: Physician Assistant

## 2017-11-18 ENCOUNTER — Other Ambulatory Visit: Payer: Self-pay

## 2017-11-18 ENCOUNTER — Ambulatory Visit (INDEPENDENT_AMBULATORY_CARE_PROVIDER_SITE_OTHER): Payer: BC Managed Care – PPO | Admitting: Physician Assistant

## 2017-11-18 VITALS — BP 138/88 | HR 68 | Temp 98.5°F | Resp 14 | Ht 63.0 in | Wt 187.0 lb

## 2017-11-18 DIAGNOSIS — I10 Essential (primary) hypertension: Secondary | ICD-10-CM

## 2017-11-18 NOTE — Progress Notes (Signed)
Patient presents to clinic today for follow-up of hypertension. Patient is currently on a regimen of HCTZ 25mg  daily. Is taking daily as directed along with daily potassium supplement. Is watching diet and staying very active. Is participating with Zumba multiple times per week. Patient denies chest pain, palpitations, lightheadedness, dizziness, vision changes or frequent headaches.  BP Readings from Last 3 Encounters:  11/18/17 138/88  10/30/17 140/84  08/29/17 140/82   Past Medical History:  Diagnosis Date  . Allergy   . Anemia   . Anxiety   . Arthritis   . Bipolar disorder ([place])   . Depression   . Hyperlipidemia   . Sleep apnea 07/29/2017    Current Outpatient Medications on File Prior to Visit  Medication Sig Dispense Refill  . cyclobenzaprine (FLEXERIL) 10 MG tablet Take 1 tablet (10 mg total) by mouth 3 (three) times daily as needed for muscle spasms. 30 tablet 0  . divalproex (DEPAKOTE ER) 250 MG 24 hr tablet Take 750 mg by mouth at bedtime.  11  . fluticasone (FLONASE) 50 MCG/ACT nasal spray Place 2 sprays into both nostrils daily. 16 g 3  . hydrochlorothiazide (HYDRODIURIL) 25 MG tablet Take 1 tablet (25 mg total) by mouth daily. 90 tablet 1  . levocetirizine (XYZAL) 5 MG tablet Take 1 tablet (5 mg total) by mouth every evening. 30 tablet 1  . montelukast (SINGULAIR) 10 MG tablet TAKE 1 TABLET BY MOUTH EVERYDAY AT BEDTIME 90 tablet 0  . potassium chloride SA (K-DUR,KLOR-CON) 20 MEQ tablet Take 1 tablet (20 mEq total) by mouth daily. 90 tablet 0  . pravastatin (PRAVACHOL) 20 MG tablet Take 1 tablet (20 mg total) by mouth daily. 90 tablet 0  . sertraline (ZOLOFT) 50 MG tablet Take 50 mg by mouth at bedtime.     Marland Kitchen zolpidem (AMBIEN) 10 MG tablet Take 10 mg by mouth at bedtime as needed for sleep.     . norethindrone-ethinyl estradiol (JINTELI) 1-5 MG-MCG TABS Take 1 tablet by mouth daily.     No current facility-administered medications on file prior to visit.      Allergies  Allergen Reactions  . Fish Oil Rash  . Penicillins Rash    Family History  Problem Relation Age of Onset  . Hypertension Unknown   . Coronary artery disease Unknown   . Colon cancer Unknown   . Diabetes Unknown   . Heart disease Mother   . Hypertension Mother   . Diabetes Father   . Hypertension Father     Social History   Socioeconomic History  . Marital status: Married    Spouse name: None  . Number of children: 1  . Years of education: None  . Highest education level: None  Social Needs  . Financial resource strain: None  . Food insecurity - worry: None  . Food insecurity - inability: None  . Transportation needs - medical: None  . Transportation needs - non-medical: None  Occupational History  . 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 Smoking status: Former Smoker    Last attempt to quit: 10/29/1988    Years since quitting: 29.0  . Smokeless tobacco: Never Used  Substance and Sexual Activity  . Alcohol use: Yes    Alcohol/week: 0.0 oz    Comment: rare  . Drug use: No  . Sexual activity: Yes  Other Topics Concern  . None  Social History Narrative  . None   Review of  Systems - See HPI.  All other ROS are negative.  BP 138/88   Pulse 68   Temp 98.5 F (36.9 C) (Oral)   Resp 14   Ht 5\' 3"  (1.6 m)   Wt 187 lb (84.8 kg)   SpO2 98%   BMI 33.13 kg/m   Physical Exam  Constitutional: She is oriented to person, place, and time and well-developed, well-nourished, and in no distress.  HENT:  Head: Normocephalic and atraumatic.  Eyes: Conjunctivae are normal. Pupils are equal, round, and reactive to light.  Neck: Neck supple.  Cardiovascular: Normal rate, regular rhythm, normal heart sounds and intact distal pulses.  Pulmonary/Chest: Effort normal and breath sounds normal.  Neurological: She is alert and oriented to person, place, and time.  Skin: Skin is warm and dry. No rash noted.  Psychiatric:  Affect normal.  Vitals reviewed.  Recent Results (from the past 2160 hour(s))  B12 and Folate Panel     Status: None   Collection Time: 10/31/17  9:42 AM  Result Value Ref Range   Vitamin B-12 649 211 - 911 pg/mL   Folate 11.5 >5.9 ng/mL  Sedimentation rate     Status: None   Collection Time: 10/31/17  9:42 AM  Result Value Ref Range   Sed Rate 27 0 - 30 mm/hr  CBC w/Diff     Status: Abnormal   Collection Time: 10/31/17  9:42 AM  Result Value Ref Range   WBC 10.6 (H) 4.0 - 10.5 K/uL   RBC 4.36 3.87 - 5.11 Mil/uL   Hemoglobin 13.5 12.0 - 15.0 g/dL   HCT 41.0 36.0 - 46.0 %   MCV 94.0 78.0 - 100.0 fl   MCHC 33.0 30.0 - 36.0 g/dL   RDW 13.2 11.5 - 15.5 %   Platelets 233.0 150.0 - 400.0 K/uL   Neutrophils Relative % 37.8 (L) 43.0 - 77.0 %   Lymphocytes Relative 52.0 (H) 12.0 - 46.0 %   Monocytes Relative 8.5 3.0 - 12.0 %   Eosinophils Relative 1.2 0.0 - 5.0 %   Basophils Relative 0.5 0.0 - 3.0 %   Neutro Abs 4.0 1.4 - 7.7 K/uL   Lymphs Abs 5.5 (H) 0.7 - 4.0 K/uL   Monocytes Absolute 0.9 0.1 - 1.0 K/uL   Eosinophils Absolute 0.1 0.0 - 0.7 K/uL   Basophils Absolute 0.1 0.0 - 0.1 K/uL    Assessment/Plan: Essential hypertension Is making significant strides with diet and exercise. Seems very motivated. BP < 140/90. Will continue current dose of HCTZ and potassium with continued TLC. Follow-up 8 weeks for reassessment. If not improved further, will need to add on additional medication. Continue ambulatory BP monitoring.      Leeanne Rio, PA-C

## 2017-11-18 NOTE — Assessment & Plan Note (Signed)
Is making significant strides with diet and exercise. Seems very motivated. BP < 140/90. Will continue current dose of HCTZ and potassium with continued TLC. Follow-up 8 weeks for reassessment. If not improved further, will need to add on additional medication. Continue ambulatory BP monitoring.

## 2017-11-18 NOTE — Patient Instructions (Signed)
Please continue current regimen daily. Keep up with diet and exercise regimen. Keep a check on BP twice weekly and record.  Follow-up in 2 months for reassessment of blood pressure.   DASH Eating Plan DASH stands for "Dietary Approaches to Stop Hypertension." The DASH eating plan is a healthy eating plan that has been shown to reduce high blood pressure (hypertension). It may also reduce your risk for type 2 diabetes, heart disease, and stroke. The DASH eating plan may also help with weight loss. What are tips for following this plan? General guidelines  Avoid eating more than 2,300 mg (milligrams) of salt (sodium) a day. If you have hypertension, you may need to reduce your sodium intake to 1,500 mg a day.  Limit alcohol intake to no more than 1 drink a day for nonpregnant women and 2 drinks a day for men. One drink equals 12 oz of beer, 5 oz of wine, or 1 oz of hard liquor.  Work with your health care provider to maintain a healthy body weight or to lose weight. Ask what an ideal weight is for you.  Get at least 30 minutes of exercise that causes your heart to beat faster (aerobic exercise) most days of the week. Activities may include walking, swimming, or biking.  Work with your health care provider or diet and nutrition specialist (dietitian) to adjust your eating plan to your individual calorie needs. Reading food labels  Check food labels for the amount of sodium per serving. Choose foods with less than 5 percent of the Daily Value of sodium. Generally, foods with less than 300 mg of sodium per serving fit into this eating plan.  To find whole grains, look for the word "whole" as the first word in the ingredient list. Shopping  Buy products labeled as "low-sodium" or "no salt added."  Buy fresh foods. Avoid canned foods and premade or frozen meals. Cooking  Avoid adding salt when cooking. Use salt-free seasonings or herbs instead of table salt or sea salt. Check with your  health care provider or pharmacist before using salt substitutes.  Do not fry foods. Cook foods using healthy methods such as baking, boiling, grilling, and broiling instead.  Cook with heart-healthy oils, such as olive, canola, soybean, or sunflower oil. Meal planning   Eat a balanced diet that includes: ? 5 or more servings of fruits and vegetables each day. At each meal, try to fill half of your plate with fruits and vegetables. ? Up to 6-8 servings of whole grains each day. ? Less than 6 oz of lean meat, poultry, or fish each day. A 3-oz serving of meat is about the same size as a deck of cards. One egg equals 1 oz. ? 2 servings of low-fat dairy each day. ? A serving of nuts, seeds, or beans 5 times each week. ? Heart-healthy fats. Healthy fats called Omega-3 fatty acids are found in foods such as flaxseeds and coldwater fish, like sardines, salmon, and mackerel.  Limit how much you eat of the following: ? Canned or prepackaged foods. ? Food that is high in trans fat, such as fried foods. ? Food that is high in saturated fat, such as fatty meat. ? Sweets, desserts, sugary drinks, and other foods with added sugar. ? Full-fat dairy products.  Do not salt foods before eating.  Try to eat at least 2 vegetarian meals each week.  Eat more home-cooked food and less restaurant, buffet, and fast food.  When eating at a restaurant,  ask that your food be prepared with less salt or no salt, if possible. What foods are recommended? The items listed may not be a complete list. Talk with your dietitian about what dietary choices are best for you. Grains Whole-grain or whole-wheat bread. Whole-grain or whole-wheat pasta. Brown rice. [place] Morrow. Bulgur. Whole-grain and low-sodium cereals. Pita bread. Low-fat, low-sodium crackers. Whole-wheat flour tortillas. Vegetables Fresh or frozen vegetables (raw, steamed, roasted, or grilled). Low-sodium or reduced-sodium tomato and vegetable juice.  Low-sodium or reduced-sodium tomato sauce and tomato paste. Low-sodium or reduced-sodium canned vegetables. Fruits All fresh, dried, or frozen fruit. Canned fruit in natural juice (without added sugar). Meat and other protein foods Skinless chicken or [place]. Ground chicken or [place]. Pork with fat trimmed off. Fish and seafood. Egg whites. Dried beans, peas, or lentils. Unsalted nuts, nut butters, and seeds. Unsalted canned beans. Lean cuts of beef with fat trimmed off. Low-sodium, lean deli meat. Dairy Low-fat (1%) or fat-free (skim) milk. Fat-free, low-fat, or reduced-fat cheeses. Nonfat, low-sodium ricotta or cottage cheese. Low-fat or nonfat yogurt. Low-fat, low-sodium cheese. Fats and oils Soft margarine without trans fats. Vegetable oil. Low-fat, reduced-fat, or light mayonnaise and salad dressings (reduced-sodium). Canola, safflower, olive, soybean, and sunflower oils. Avocado. Seasoning and other foods Herbs. Spices. Seasoning mixes without salt. Unsalted popcorn and pretzels. Fat-free sweets. What foods are not recommended? The items listed may not be a complete list. Talk with your dietitian about what dietary choices are best for you. Grains Baked goods made with fat, such as croissants, muffins, or some breads. Dry pasta or rice meal packs. Vegetables Creamed or fried vegetables. Vegetables in a cheese sauce. Regular canned vegetables (not low-sodium or reduced-sodium). Regular canned tomato sauce and paste (not low-sodium or reduced-sodium). Regular tomato and vegetable juice (not low-sodium or reduced-sodium). Angie Fava. Olives. Fruits Canned fruit in a light or heavy syrup. Fried fruit. Fruit in cream or butter sauce. Meat and other protein foods Fatty cuts of meat. Ribs. Fried meat. Berniece Salines. Sausage. Bologna and other processed lunch meats. Salami. Fatback. Hotdogs. Bratwurst. Salted nuts and seeds. Canned beans with added salt. Canned or smoked fish. Whole eggs or egg yolks. Chicken  or [place] with skin. Dairy Whole or 2% milk, cream, and half-and-half. Whole or full-fat cream cheese. Whole-fat or sweetened yogurt. Full-fat cheese. Nondairy creamers. Whipped toppings. Processed cheese and cheese spreads. Fats and oils Butter. Stick margarine. Lard. Shortening. Ghee. Bacon fat. Tropical oils, such as coconut, palm kernel, or palm oil. Seasoning and other foods Salted popcorn and pretzels. Onion salt, garlic salt, seasoned salt, table salt, and sea salt. Worcestershire sauce. Tartar sauce. Barbecue sauce. Teriyaki sauce. Soy sauce, including reduced-sodium. Steak sauce. Canned and packaged gravies. Fish sauce. Oyster sauce. Cocktail sauce. Horseradish that you find on the shelf. Ketchup. Mustard. Meat flavorings and tenderizers. Bouillon cubes. Hot sauce and Tabasco sauce. Premade or packaged marinades. Premade or packaged taco seasonings. Relishes. Regular salad dressings. Where to find more information:  National Heart, Lung, and [place]: https://wilson-eaton.com/  American Heart Association: www.heart.org Summary  The DASH eating plan is a healthy eating plan that has been shown to reduce high blood pressure (hypertension). It may also reduce your risk for type 2 diabetes, heart disease, and stroke.  With the DASH eating plan, you should limit salt (sodium) intake to 2,300 mg a day. If you have hypertension, you may need to reduce your sodium intake to 1,500 mg a day.  When on the DASH eating plan, aim to eat more fresh  fruits and vegetables, whole grains, lean proteins, low-fat dairy, and heart-healthy fats.  Work with your health care provider or diet and nutrition specialist (dietitian) to adjust your eating plan to your individual calorie needs. This information is not intended to replace advice given to you by your health care provider. Make sure you discuss any questions you have with your health care provider. Document Released: 10/04/2011 Document Revised:  10/08/2016 Document Reviewed: 10/08/2016 Elsevier Interactive Patient Education  [employer].

## 2017-12-02 ENCOUNTER — Other Ambulatory Visit: Payer: Self-pay | Admitting: Emergency Medicine

## 2017-12-02 MED ORDER — PRAVASTATIN SODIUM 20 MG PO TABS: 20.0000 mg | ORAL_TABLET | Freq: Every day | ORAL | 0 refills | Status: DC

## 2017-12-13 ENCOUNTER — Ambulatory Visit: Payer: BC Managed Care – PPO | Admitting: Physician Assistant

## 2018-01-08 ENCOUNTER — Ambulatory Visit: Payer: Self-pay | Admitting: Neurology

## 2018-01-09 ENCOUNTER — Encounter: Payer: Self-pay | Admitting: Neurology

## 2018-01-13 ENCOUNTER — Ambulatory Visit (INDEPENDENT_AMBULATORY_CARE_PROVIDER_SITE_OTHER): Payer: BC Managed Care – PPO | Admitting: Physician Assistant

## 2018-01-13 ENCOUNTER — Other Ambulatory Visit: Payer: Self-pay

## 2018-01-13 ENCOUNTER — Encounter: Payer: Self-pay | Admitting: Physician Assistant

## 2018-01-13 VITALS — BP 118/78 | HR 64 | Temp 98.3°F | Resp 15 | Ht 62.0 in | Wt 181.2 lb

## 2018-01-13 DIAGNOSIS — Z23 Encounter for immunization: Secondary | ICD-10-CM

## 2018-01-13 DIAGNOSIS — Z124 Encounter for screening for malignant neoplasm of cervix: Secondary | ICD-10-CM | POA: Diagnosis not present

## 2018-01-13 DIAGNOSIS — R7303 Prediabetes: Secondary | ICD-10-CM

## 2018-01-13 DIAGNOSIS — F3170 Bipolar disorder, currently in remission, most recent episode unspecified: Secondary | ICD-10-CM

## 2018-01-13 DIAGNOSIS — Z0000 Encounter for general adult medical examination without abnormal findings: Secondary | ICD-10-CM

## 2018-01-13 DIAGNOSIS — I10 Essential (primary) hypertension: Secondary | ICD-10-CM

## 2018-01-13 DIAGNOSIS — Z1231 Encounter for screening mammogram for malignant neoplasm of breast: Secondary | ICD-10-CM

## 2018-01-13 DIAGNOSIS — E782 Mixed hyperlipidemia: Secondary | ICD-10-CM | POA: Diagnosis not present

## 2018-01-13 DIAGNOSIS — Z1239 Encounter for other screening for malignant neoplasm of breast: Secondary | ICD-10-CM

## 2018-01-13 NOTE — Progress Notes (Signed)
Patient presents to clinic today for annual exam.  Patient is fasting for labs.  Diet -- Well-balanced. Is following a meal prep program. Is staying well-hydrated. Exercise -- Zumba twice weekly. Staying active each day.   Acute Concerns: Denies acute concerns today.   Chronic Issues: Hypertension -- Currently on HCTZ 25 mg daily along with potassium supplement. Is taking as directed. Patient denies chest pain, palpitations, lightheadedness, dizziness, vision changes or frequent headaches. Is working very hard on diet and exercise. Has noted weight loss with regimen.   BP Readings from Last 3 Encounters:  01/13/18 118/78  11/18/17 138/88  10/30/17 140/84   Hyperlipidemia -- currently taking Pravachol 20 mg daily as directed. Also taking 81 mg ASA daily. Diet and exercise as noted above. Is due for repeat labs today.   Bipolar Disorder -- Followed by Psychiatry. Is currently on a regimen of Depakote, Sertraline. Is taking as directed. Notes mood is doing very well overall. Denies SI/HI.   Health Maintenance: Immunizations -- Declines flu shot. Unsure of last Tetanus. Agrees to get today..  Colonoscopy -- Up-to-date. Mammogram -- Due in May. Has scheduled with GYN. PAP --Due. Has scheduled with GYN.  Past Medical History:  Diagnosis Date  . Allergy   . Anemia   . Anxiety   . Arthritis   . Bipolar disorder ([place])   . Depression   . Hyperlipidemia   . Sleep apnea 07/29/2017    Past Surgical History:  Procedure Laterality Date  . CESAREAN SECTION    . SIGMOIDOSCOPY      Current Outpatient Medications on File Prior to Visit  Medication Sig Dispense Refill  . ALPRAZolam (XANAX) 0.5 MG tablet alprazolam 0.5 mg tablet    . cyclobenzaprine (FLEXERIL) 10 MG tablet Take 1 tablet (10 mg total) by mouth 3 (three) times daily as needed for muscle spasms. 30 tablet 0  . divalproex (DEPAKOTE ER) 250 MG 24 hr tablet Take 750 mg by mouth at bedtime.  11  . fluticasone (FLONASE) 50  MCG/ACT nasal spray Place 2 sprays into both nostrils daily. 16 g 3  . hydrochlorothiazide (HYDRODIURIL) 25 MG tablet Take 1 tablet (25 mg total) by mouth daily. 90 tablet 1  . levocetirizine (XYZAL) 5 MG tablet Take 1 tablet (5 mg total) by mouth every evening. 30 tablet 1  . montelukast (SINGULAIR) 10 MG tablet TAKE 1 TABLET BY MOUTH EVERYDAY AT BEDTIME 90 tablet 0  . norethindrone-ethinyl estradiol (JINTELI) 1-5 MG-MCG TABS Take 1 tablet by mouth daily.    . potassium chloride SA (K-DUR,KLOR-CON) 20 MEQ tablet Take 1 tablet (20 mEq total) by mouth daily. 90 tablet 0  . pravastatin (PRAVACHOL) 20 MG tablet Take 1 tablet (20 mg total) by mouth daily. 90 tablet 0  . sertraline (ZOLOFT) 50 MG tablet Take 50 mg by mouth at bedtime.     Marland Kitchen zolpidem (AMBIEN) 10 MG tablet Take 10 mg by mouth at bedtime as needed for sleep.      No current facility-administered medications on file prior to visit.     Allergies  Allergen Reactions  . Fish Oil Rash  . Penicillins Rash    Family History  Problem Relation Age of Onset  . Hypertension Unknown   . Coronary artery disease Unknown   . Colon cancer Unknown   . Diabetes Unknown   . Heart disease Mother   . Hypertension Mother   . Diabetes Father   . Hypertension Father     Social History  Socioeconomic History  . Marital status: Married    Spouse name: Not on file  . Number of children: 1  . Years of education: Not on file  . Highest education level: Not on file  Social Needs  . Financial resource strain: Not on file  . Food insecurity - worry: Not on file  . Food insecurity - inability: Not on file  . Transportation needs - medical: Not on file  . Transportation needs - non-medical: Not on file  Occupational History  . Occupation: SCHOOL Nurse, adult: [employer]  Tobacco Use  . Smoking status: Former Smoker    Last attempt to quit: 10/29/1988    Years since quitting: 29.2  . Smokeless tobacco: Never Used    Substance and Sexual Activity  . Alcohol use: Yes    Alcohol/week: 0.0 oz    Comment: rare  . Drug use: No  . Sexual activity: Yes  Other Topics Concern  . Not on file  Social History Narrative  . Not on file   Review of Systems  Constitutional: Negative for fever and weight loss.  HENT: Negative for ear discharge, ear pain, hearing loss and tinnitus.   Eyes: Negative for blurred vision, double vision, photophobia and pain.  Respiratory: Negative for cough and shortness of breath.   Cardiovascular: Negative for chest pain and palpitations.  Gastrointestinal: Negative for abdominal pain, blood in stool, constipation, diarrhea, heartburn, melena, nausea and vomiting.  Genitourinary: Negative for dysuria, flank pain, frequency, hematuria and urgency.  Musculoskeletal: Negative for falls.  Neurological: Negative for dizziness, loss of consciousness and headaches.  Endo/Heme/Allergies: Negative for environmental allergies.  Psychiatric/Behavioral: Negative for depression, hallucinations, substance abuse and suicidal ideas. The patient is not nervous/anxious and does not have insomnia.    BP 118/78   Pulse 64   Temp 98.3 F (36.8 C) (Oral)   Resp 15   Ht '5\' 2"'$  (1.575 m)   Wt 181 lb 3.2 oz (82.2 kg)   SpO2 99%   BMI 33.14 kg/m   Physical Exam  Constitutional: She is oriented to person, place, and time and well-developed, well-nourished, and in no distress.  HENT:  Head: Normocephalic and atraumatic.  Right Ear: Tympanic membrane, external ear and ear canal normal.  Left Ear: Tympanic membrane, external ear and ear canal normal.  Nose: Nose normal. No mucosal edema.  Mouth/Throat: Uvula is midline, oropharynx is clear and moist and mucous membranes are normal. No oropharyngeal exudate or posterior oropharyngeal erythema.  Eyes: Conjunctivae are normal. Pupils are equal, round, and reactive to light.  Neck: Neck supple. No thyromegaly present.  Cardiovascular: Normal rate,  regular rhythm, normal heart sounds and intact distal pulses.  Pulmonary/Chest: Effort normal and breath sounds normal. No respiratory distress. She has no wheezes. She has no rales.  Abdominal: Soft. Bowel sounds are normal. She exhibits no distension and no mass. There is no tenderness. There is no rebound and no guarding.  Lymphadenopathy:    She has no cervical adenopathy.  Neurological: She is alert and oriented to person, place, and time. No cranial nerve deficit.  Skin: Skin is warm and dry. No rash noted.  Psychiatric: Affect normal.  Vitals reviewed.  Recent Results (from the past 2160 hour(s))  B12 and Folate Panel     Status: None   Collection Time: 10/31/17  9:42 AM  Result Value Ref Range   Vitamin B-12 649 211 - 911 pg/mL   Folate 11.5 >5.9 ng/mL  Sedimentation rate  Status: None   Collection Time: 10/31/17  9:42 AM  Result Value Ref Range   Sed Rate 27 0 - 30 mm/hr  CBC w/Diff     Status: Abnormal   Collection Time: 10/31/17  9:42 AM  Result Value Ref Range   WBC 10.6 (H) 4.0 - 10.5 K/uL   RBC 4.36 3.87 - 5.11 Mil/uL   Hemoglobin 13.5 12.0 - 15.0 g/dL   HCT 41.0 36.0 - 46.0 %   MCV 94.0 78.0 - 100.0 fl   MCHC 33.0 30.0 - 36.0 g/dL   RDW 13.2 11.5 - 15.5 %   Platelets 233.0 150.0 - 400.0 K/uL   Neutrophils Relative % 37.8 (L) 43.0 - 77.0 %   Lymphocytes Relative 52.0 (H) 12.0 - 46.0 %   Monocytes Relative 8.5 3.0 - 12.0 %   Eosinophils Relative 1.2 0.0 - 5.0 %   Basophils Relative 0.5 0.0 - 3.0 %   Neutro Abs 4.0 1.4 - 7.7 K/uL   Lymphs Abs 5.5 (H) 0.7 - 4.0 K/uL   Monocytes Absolute 0.9 0.1 - 1.0 K/uL   Eosinophils Absolute 0.1 0.0 - 0.7 K/uL   Basophils Absolute 0.1 0.0 - 0.1 K/uL  CBC w/Diff     Status: Abnormal   Collection Time: 01/13/18  4:45 PM  Result Value Ref Range   WBC 12.2 (H) 3.8 - 10.8 Thousand/uL   RBC 4.19 3.80 - 5.10 Million/uL   Hemoglobin 13.0 11.7 - 15.5 g/dL   HCT 37.2 35.0 - 45.0 %   MCV 88.8 80.0 - 100.0 fL   MCH 31.0 27.0 -  33.0 pg   MCHC 34.9 32.0 - 36.0 g/dL   RDW 12.8 11.0 - 15.0 %   Platelets 225 140 - 400 Thousand/uL   MPV 11.7 7.5 - 12.5 fL   Neutro Abs 4,148 1,500 - 7,800 cells/uL   Lymphs Abs 7,039 (H) 850 - 3,900 cells/uL   WBC mixed population 854 200 - 950 cells/uL   Eosinophils Absolute 98 15 - 500 cells/uL   Basophils Absolute 61 0 - 200 cells/uL   Neutrophils Relative % 34 %   Total Lymphocyte 57.7 %   Monocytes Relative 7.0 %   Eosinophils Relative 0.8 %   Basophils Relative 0.5 %  Comp Met (CMET)     Status: None   Collection Time: 01/13/18  4:45 PM  Result Value Ref Range   Glucose, Bld 77 65 - 99 mg/dL    Comment: .            Fasting reference interval .    BUN 12 7 - 25 mg/dL   Creat 0.65 0.50 - 1.05 mg/dL    Comment: For patients >78 years of age, the reference limit for Creatinine is approximately 13% higher for people identified as African-American. .    BUN/Creatinine Ratio NOT APPLICABLE 6 - 22 (calc)   Sodium 141 135 - 146 mmol/L   Potassium 3.8 3.5 - 5.3 mmol/L   Chloride 104 98 - 110 mmol/L   CO2 28 20 - 32 mmol/L   Calcium 9.3 8.6 - 10.4 mg/dL   Total Protein 7.4 6.1 - 8.1 g/dL   Albumin 4.2 3.6 - 5.1 g/dL   Globulin 3.2 1.9 - 3.7 g/dL (calc)   AG Ratio 1.3 1.0 - 2.5 (calc)   Total Bilirubin 0.5 0.2 - 1.2 mg/dL   Alkaline phosphatase (APISO) 69 33 - 130 U/L   AST 16 10 - 35 U/L   ALT 10 6 -  29 U/L  Hemoglobin A1c     Status: Abnormal   Collection Time: 01/13/18  4:45 PM  Result Value Ref Range   Hgb A1c MFr Bld 6.0 (H) <5.7 % of total Hgb    Comment: For someone without known diabetes, a hemoglobin  A1c value between 5.7% and 6.4% is consistent with prediabetes and should be confirmed with a  follow-up test. . For someone with known diabetes, a value <7% indicates that their diabetes is well controlled. A1c targets should be individualized based on duration of diabetes, age, comorbid conditions, and other considerations. . This assay result is  consistent with an increased risk of diabetes. . Currently, no consensus exists regarding use of hemoglobin A1c for diagnosis of diabetes for children. .    Mean Plasma Glucose 126 (calc)   eAG (mmol/L) 7.0 (calc)  Lipid Profile     Status: Abnormal   Collection Time: 01/13/18  4:45 PM  Result Value Ref Range   Cholesterol 175 <200 mg/dL   HDL 44 (L) >50 mg/dL   Triglycerides 86 <150 mg/dL   LDL Cholesterol (Calc) 112 (H) mg/dL (calc)    Comment: Reference range: <100 . Desirable range <100 mg/dL for primary prevention;   <70 mg/dL for patients with CHD or diabetic patients  with > or = 2 CHD risk factors. Marland Kitchen LDL-C is now calculated using the Lacara Dunsworth-Hopkins  calculation, which is a validated novel method providing  better accuracy than the Friedewald equation in the  estimation of LDL-C.  Cresenciano Genre et al. Annamaria Helling. 6286;381(77): 2061-2068  (http://education.[facility].com/faq/FAQ164)    Total CHOL/HDL Ratio 4.0 <5.0 (calc)   Non-HDL Cholesterol (Calc) 131 (H) <130 mg/dL (calc)    Comment: For patients with diabetes plus 1 major ASCVD risk  factor, treating to a non-HDL-C goal of <100 mg/dL  (LDL-C of <70 mg/dL) is considered a therapeutic  option.    Assessment/Plan: Essential hypertension BP much improved. Continue diet and exercise regimen. We will continue same medication regimen for now. Patient is to keep a close eye on BP at home. If decreasing, will need to reduce dose of HCTZ. Follow-up discussed.   Bipolar affective disorder in remission Followed by Psychiatry. Doing very well. Has follow-up in a few weeks.   Borderline diabetes Weight loss has hopefully helped. Healthy diet reviewed. Will repeat A1C today.   Breast cancer screening Mammogram is scheduled per patient. Defers breast examination to GYN.  Cervical cancer screening PAP scheduled with GYN.  Hyperlipidemia Taking statin as directed. Dietary and exercise recommendations reviewed. Will repeat  fasting lipids today.   Need for Tdap vaccination TDaP given today.   Visit for preventive health examination Depression screen negative. Health Maintenance reviewed. Preventive schedule discussed and handout given in AVS. Will obtain fasting labs today.     Leeanne Rio, PA-C

## 2018-01-13 NOTE — Patient Instructions (Signed)
Please go to the lab for blood work.  Our office will call you with your results unless you have chosen to receive results via MyChart. If your blood work is normal we will follow-up each year for physicals and as scheduled for chronic medical problems. If anything is abnormal we will treat accordingly and get you in for a follow-up.  Keep up with diet and exercise. You are doing great! Try to check BP once weekly at least and record.  If you note BP trending down further, call me and we will need to reduce doses of medication. Otherwise follow-up in 6 months.   Preventive Care 40-64 Years, Female Preventive care refers to lifestyle choices and visits with your health care provider that can promote health and wellness. What does preventive care include?  A yearly physical exam. This is also called an annual well check.  Dental exams once or twice a year.  Routine eye exams. Ask your health care provider how often you should have your eyes checked.  Personal lifestyle choices, including: ? Daily care of your teeth and gums. ? Regular physical activity. ? Eating a healthy diet. ? Avoiding tobacco and drug use. ? Limiting alcohol use. ? Practicing safe sex. ? Taking low-dose aspirin daily starting at age 65. ? Taking vitamin and mineral supplements as recommended by your health care provider. What happens during an annual well check? The services and screenings done by your health care provider during your annual well check will depend on your age, overall health, lifestyle risk factors, and family history of disease. Counseling Your health care provider may ask you questions about your:  Alcohol use.  Tobacco use.  Drug use.  Emotional well-being.  Home and relationship well-being.  Sexual activity.  Eating habits.  Work and work Statistician.  Method of birth control.  Menstrual cycle.  Pregnancy history.  Screening You may have the following tests or  measurements:  Height, weight, and BMI.  Blood pressure.  Lipid and cholesterol levels. These may be checked every 5 years, or more frequently if you are over 74 years old.  Skin check.  Lung cancer screening. You may have this screening every year starting at age 13 if you have a 30-pack-year history of smoking and currently smoke or have quit within the past 15 years.  Fecal occult blood test (FOBT) of the stool. You may have this test every year starting at age 34.  Flexible sigmoidoscopy or colonoscopy. You may have a sigmoidoscopy every 5 years or a colonoscopy every 10 years starting at age 49.  Hepatitis C blood test.  Hepatitis B blood test.  Sexually transmitted disease (STD) testing.  Diabetes screening. This is done by checking your blood sugar (glucose) after you have not eaten for a while (fasting). You may have this done every 1-3 years.  Mammogram. This may be done every 1-2 years. Talk to your health care provider about when you should start having regular mammograms. This may depend on whether you have a family history of breast cancer.  BRCA-related cancer screening. This may be done if you have a family history of breast, ovarian, tubal, or peritoneal cancers.  Pelvic exam and Pap test. This may be done every 3 years starting at age 12. Starting at age 59, this may be done every 5 years if you have a Pap test in combination with an HPV test.  Bone density scan. This is done to screen for osteoporosis. You may have this scan if  you are at high risk for osteoporosis.  Discuss your test results, treatment options, and if necessary, the need for more tests with your health care provider. Vaccines Your health care provider may recommend certain vaccines, such as:  Influenza vaccine. This is recommended every year.  Tetanus, diphtheria, and acellular pertussis (Tdap, Td) vaccine. You may need a Td booster every 10 years.  Varicella vaccine. You may need this if  you have not been vaccinated.  Zoster vaccine. You may need this after age 10.  Measles, mumps, and rubella (MMR) vaccine. You may need at least one dose of MMR if you were born in 1957 or later. You may also need a second dose.  Pneumococcal 13-valent conjugate (PCV13) vaccine. You may need this if you have certain conditions and were not previously vaccinated.  Pneumococcal polysaccharide (PPSV23) vaccine. You may need one or two doses if you smoke cigarettes or if you have certain conditions.  Meningococcal vaccine. You may need this if you have certain conditions.  Hepatitis A vaccine. You may need this if you have certain conditions or if you travel or work in places where you may be exposed to hepatitis A.  Hepatitis B vaccine. You may need this if you have certain conditions or if you travel or work in places where you may be exposed to hepatitis B.  Haemophilus influenzae type b (Hib) vaccine. You may need this if you have certain conditions.  Talk to your health care provider about which screenings and vaccines you need and how often you need them. This information is not intended to replace advice given to you by your health care provider. Make sure you discuss any questions you have with your health care provider. Document Released: 11/11/2015 Document Revised: 07/04/2016 Document Reviewed: 08/16/2015 Elsevier Interactive Patient Education  [employer].

## 2018-01-14 ENCOUNTER — Other Ambulatory Visit: Payer: Self-pay

## 2018-01-14 DIAGNOSIS — Z23 Encounter for immunization: Secondary | ICD-10-CM | POA: Insufficient documentation

## 2018-01-14 DIAGNOSIS — Z124 Encounter for screening for malignant neoplasm of cervix: Secondary | ICD-10-CM | POA: Insufficient documentation

## 2018-01-14 DIAGNOSIS — D729 Disorder of white blood cells, unspecified: Secondary | ICD-10-CM

## 2018-01-14 DIAGNOSIS — Z1239 Encounter for other screening for malignant neoplasm of breast: Secondary | ICD-10-CM | POA: Insufficient documentation

## 2018-01-14 LAB — CBC WITH DIFFERENTIAL/PLATELET
BASOS ABS: 61 {cells}/uL (ref 0–200)
Basophils Relative: 0.5 %
EOS PCT: 0.8 %
Eosinophils Absolute: 98 cells/uL (ref 15–500)
HEMATOCRIT: 37.2 % (ref 35.0–45.0)
HEMOGLOBIN: 13 g/dL (ref 11.7–15.5)
LYMPHS ABS: 7039 {cells}/uL — AB (ref 850–3900)
MCH: 31 pg (ref 27.0–33.0)
MCHC: 34.9 g/dL (ref 32.0–36.0)
MCV: 88.8 fL (ref 80.0–100.0)
MONOS PCT: 7 %
MPV: 11.7 fL (ref 7.5–12.5)
Neutro Abs: 4148 cells/uL (ref 1500–7800)
Neutrophils Relative %: 34 %
Platelets: 225 10*3/uL (ref 140–400)
RBC: 4.19 10*6/uL (ref 3.80–5.10)
RDW: 12.8 % (ref 11.0–15.0)
Total Lymphocyte: 57.7 %
WBC mixed population: 854 cells/uL (ref 200–950)
WBC: 12.2 10*3/uL — ABNORMAL HIGH (ref 3.8–10.8)

## 2018-01-14 LAB — COMPREHENSIVE METABOLIC PANEL
AG RATIO: 1.3 (calc) (ref 1.0–2.5)
ALBUMIN MSPROF: 4.2 g/dL (ref 3.6–5.1)
ALT: 10 U/L (ref 6–29)
AST: 16 U/L (ref 10–35)
Alkaline phosphatase (APISO): 69 U/L (ref 33–130)
BILIRUBIN TOTAL: 0.5 mg/dL (ref 0.2–1.2)
BUN: 12 mg/dL (ref 7–25)
CALCIUM: 9.3 mg/dL (ref 8.6–10.4)
CHLORIDE: 104 mmol/L (ref 98–110)
CO2: 28 mmol/L (ref 20–32)
Creat: 0.65 mg/dL (ref 0.50–1.05)
GLOBULIN: 3.2 g/dL (ref 1.9–3.7)
GLUCOSE: 77 mg/dL (ref 65–99)
Potassium: 3.8 mmol/L (ref 3.5–5.3)
SODIUM: 141 mmol/L (ref 135–146)
TOTAL PROTEIN: 7.4 g/dL (ref 6.1–8.1)

## 2018-01-14 LAB — HEMOGLOBIN A1C
EAG (MMOL/L): 7 (calc)
Hgb A1c MFr Bld: 6 % of total Hgb — ABNORMAL HIGH (ref <5.7)
Mean Plasma Glucose: 126 (calc)

## 2018-01-14 LAB — LIPID PANEL
CHOL/HDL RATIO: 4 (calc) (ref <5.0)
Cholesterol: 175 mg/dL (ref <200)
HDL: 44 mg/dL — ABNORMAL LOW (ref >50)
LDL Cholesterol (Calc): 112 mg/dL (calc) — ABNORMAL HIGH
Non-HDL Cholesterol (Calc): 131 mg/dL (calc) — ABNORMAL HIGH (ref <130)
TRIGLYCERIDES: 86 mg/dL (ref <150)

## 2018-01-14 NOTE — Assessment & Plan Note (Signed)
Followed by Psychiatry. Doing very well. Has follow-up in a few weeks.

## 2018-01-14 NOTE — Assessment & Plan Note (Signed)
PAP scheduled with GYN.

## 2018-01-14 NOTE — Assessment & Plan Note (Signed)
Depression screen negative. Health Maintenance reviewed. Preventive schedule discussed and handout given in AVS. Will obtain fasting labs today.  

## 2018-01-14 NOTE — Assessment & Plan Note (Signed)
- 

## 2018-01-14 NOTE — Assessment & Plan Note (Signed)
BP much improved. Continue diet and exercise regimen. We will continue same medication regimen for now. Patient is to keep a close eye on BP at home. If decreasing, will need to reduce dose of HCTZ. Follow-up discussed.

## 2018-01-14 NOTE — Assessment & Plan Note (Signed)
Weight loss has hopefully helped. Healthy diet reviewed. Will repeat A1C today.

## 2018-01-14 NOTE — Assessment & Plan Note (Signed)
Taking statin as directed. Dietary and exercise recommendations reviewed. Will repeat fasting lipids today.

## 2018-01-14 NOTE — Assessment & Plan Note (Signed)
Mammogram is scheduled per patient. Defers breast examination to GYN.

## 2018-01-28 ENCOUNTER — Telehealth: Payer: Self-pay | Admitting: Emergency Medicine

## 2018-01-28 ENCOUNTER — Encounter: Payer: Self-pay | Admitting: Physician Assistant

## 2018-01-28 NOTE — Telephone Encounter (Signed)
Patient advised that letter was ready up front for pick up for reason why she missed work for office visit. Patient states for her ongoing issues

## 2018-01-28 NOTE — Telephone Encounter (Signed)
Letter printed, signed and placed up front in file cabinet for pick up.

## 2018-01-28 NOTE — Telephone Encounter (Signed)
Copied from [place] [id number]. Topic: General - Other >> Jan 28, 2018  2:39 PM Carolyn Stare wrote:   Pt call to say she saw Elyn Aquas on 10/30/17 , 11/18/17, 01/13/18   Phone number [phone] >> Jan 28, 2018  3:20 PM Neva Seat wrote: Pt needing a note for her job stating she came in office -list dates, under Dr. Earlie Server care and having on going issues. Please call pt to discuss.  [phone] - until 4 today [phone] - cell number

## 2018-01-29 ENCOUNTER — Telehealth: Payer: Self-pay | Admitting: Physician Assistant

## 2018-01-29 ENCOUNTER — Encounter: Payer: Self-pay | Admitting: Emergency Medicine

## 2018-01-29 NOTE — Telephone Encounter (Signed)
Please advise. Thank you

## 2018-01-29 NOTE — Telephone Encounter (Signed)
See phone note from 01/28/18. This has all already been taken care of.

## 2018-01-29 NOTE — Telephone Encounter (Signed)
Copied from [place] [id number]. Topic: General - Other >> Jan 28, 2018  2:39 PM Carolyn Stare wrote:   Pt call to say she saw Elyn Aquas on 10/30/17 , 11/18/17, 01/13/18   Phone number [phone] >> Jan 28, 2018  3:20 PM Neva Seat wrote: Pt needing a note for her job stating she came in office -list dates, under Dr. Earlie Server care and having on going issues. Please call pt to discuss.  [phone] - until 4 today [phone] - cell number >> Jan 29, 2018  8:53 AM Cleaster Corin, NT wrote: Pt. stated that she will be by office later on today to pick up notes. 01/29/18

## 2018-02-06 ENCOUNTER — Other Ambulatory Visit: Payer: Self-pay | Admitting: Physician Assistant

## 2018-02-06 DIAGNOSIS — I10 Essential (primary) hypertension: Secondary | ICD-10-CM

## 2018-02-14 ENCOUNTER — Encounter (INDEPENDENT_AMBULATORY_CARE_PROVIDER_SITE_OTHER): Payer: Self-pay

## 2018-02-14 ENCOUNTER — Encounter: Payer: Self-pay | Admitting: *Deleted

## 2018-02-14 ENCOUNTER — Inpatient Hospital Stay: Payer: BC Managed Care – PPO

## 2018-02-14 ENCOUNTER — Other Ambulatory Visit: Payer: Self-pay

## 2018-02-14 ENCOUNTER — Encounter: Payer: Self-pay | Admitting: Hematology & Oncology

## 2018-02-14 ENCOUNTER — Inpatient Hospital Stay: Payer: BC Managed Care – PPO | Attending: Family | Admitting: Hematology & Oncology

## 2018-02-14 VITALS — BP 149/78 | HR 75 | Temp 99.4°F | Resp 18 | Wt 182.0 lb

## 2018-02-14 DIAGNOSIS — F319 Bipolar disorder, unspecified: Secondary | ICD-10-CM | POA: Diagnosis not present

## 2018-02-14 DIAGNOSIS — Z880 Allergy status to penicillin: Secondary | ICD-10-CM | POA: Insufficient documentation

## 2018-02-14 DIAGNOSIS — D72829 Elevated white blood cell count, unspecified: Secondary | ICD-10-CM | POA: Insufficient documentation

## 2018-02-14 DIAGNOSIS — Z800 Family history of malignant neoplasm of digestive organs: Secondary | ICD-10-CM

## 2018-02-14 DIAGNOSIS — Z87891 Personal history of nicotine dependence: Secondary | ICD-10-CM | POA: Diagnosis not present

## 2018-02-14 DIAGNOSIS — Z79899 Other long term (current) drug therapy: Secondary | ICD-10-CM | POA: Insufficient documentation

## 2018-02-14 DIAGNOSIS — E785 Hyperlipidemia, unspecified: Secondary | ICD-10-CM | POA: Insufficient documentation

## 2018-02-14 DIAGNOSIS — G4730 Sleep apnea, unspecified: Secondary | ICD-10-CM | POA: Diagnosis not present

## 2018-02-14 DIAGNOSIS — D72820 Lymphocytosis (symptomatic): Secondary | ICD-10-CM | POA: Diagnosis not present

## 2018-02-14 LAB — CBC WITH DIFFERENTIAL (CANCER CENTER ONLY)
BASOS PCT: 0 %
Basophils Absolute: 0 10*3/uL (ref 0.0–0.1)
Eosinophils Absolute: 0.1 10*3/uL (ref 0.0–0.5)
Eosinophils Relative: 1 %
HEMATOCRIT: 41.1 % (ref 34.8–46.6)
HEMOGLOBIN: 13.7 g/dL (ref 11.6–15.9)
LYMPHS ABS: 5.5 10*3/uL — AB (ref 0.9–3.3)
Lymphocytes Relative: 50 %
MCH: 31.2 pg (ref 26.0–34.0)
MCHC: 33.3 g/dL (ref 32.0–36.0)
MCV: 93.6 fL (ref 81.0–101.0)
Monocytes Absolute: 1.1 10*3/uL — ABNORMAL HIGH (ref 0.1–0.9)
Monocytes Relative: 10 %
NEUTROS ABS: 4.4 10*3/uL (ref 1.5–6.5)
NEUTROS PCT: 39 %
Platelet Count: 231 10*3/uL (ref 145–400)
RBC: 4.39 MIL/uL (ref 3.70–5.32)
RDW: 13.1 % (ref 11.1–15.7)
WBC Count: 11.2 10*3/uL — ABNORMAL HIGH (ref 3.9–10.0)

## 2018-02-14 LAB — TECHNOLOGIST SMEAR REVIEW

## 2018-02-14 NOTE — Progress Notes (Signed)
Referral MD  Reason for Referral: Leukocytosis -likely reactive  Chief Complaint  Patient presents with  . New Patient (Initial Visit)  : My white blood cells are high.  HPI: [name] is a very charming 57 year old African-American female.  She is a Company secretary.  She also is involved with school counseling.  She was found to have mild leukocytosis.  She is seen by Dr. Elyn Aquas.  She has had her blood checked for several years.  Going back to 2015, her white cell count was 13,000.  She is always had a slight lymphocytosis.  In January of this year, her CBC showed a white cell count of 10.6.  Hemoglobin 13.5.  Platelet count 233,000.  Her white cell differential showed 38 segs by 52 lymphs.  In March of this year, her white cell count was 12.2.  Hemoglobin 13.  Platelet count 225,000.  Her white cell differential showed 34 segs and 58 lymphs.  She has never had problems with infections.  She has had no recent tobacco use.  She stopped about 25 years ago.  She is under quite a bit of stress.  An in-law passed away recently.  She has had no weight loss or weight gain.  She is exercising.  Parents she has had some lower back issues.  She sees Dr. Tonita Cong of [facility]  He has done an MRI.  She has not had any steroid injections.  She has had no cough or shortness of breath.  She has had no rashes.  She has had no change in bowel or bladder habits.  She is through the change of life.  Her last mammogram was a year ago.   There is no history of sickle cell in the family.  There are no blood problems in the family.  Overall, her performance status is ECOG 1.     Past Medical History:  Diagnosis Date  . Allergy   . Anemia   . Anxiety   . Arthritis   . Bipolar disorder ([place])   . Depression   . Hyperlipidemia   . Sleep apnea 07/29/2017  :  Past Surgical History:  Procedure Laterality Date  . CESAREAN SECTION    . SIGMOIDOSCOPY    :   Current Outpatient  Medications:  .  ALPRAZolam (XANAX) 0.5 MG tablet, alprazolam 0.5 mg tablet, Disp: , Rfl:  .  cyclobenzaprine (FLEXERIL) 10 MG tablet, Take 1 tablet (10 mg total) by mouth 3 (three) times daily as needed for muscle spasms., Disp: 30 tablet, Rfl: 0 .  divalproex (DEPAKOTE ER) 250 MG 24 hr tablet, Take 750 mg by mouth at bedtime., Disp: , Rfl: 11 .  fluticasone (FLONASE) 50 MCG/ACT nasal spray, Place 2 sprays into both nostrils daily., Disp: 16 g, Rfl: 3 .  hydrochlorothiazide (HYDRODIURIL) 25 MG tablet, Take 1 tablet (25 mg total) by mouth daily., Disp: 90 tablet, Rfl: 1 .  KLOR-CON M20 20 MEQ tablet, TAKE 1 TABLET BY MOUTH EVERY DAY, Disp: 90 tablet, Rfl: 1 .  levocetirizine (XYZAL) 5 MG tablet, Take 1 tablet (5 mg total) by mouth every evening., Disp: 30 tablet, Rfl: 1 .  montelukast (SINGULAIR) 10 MG tablet, TAKE 1 TABLET BY MOUTH EVERYDAY AT BEDTIME, Disp: 90 tablet, Rfl: 0 .  norethindrone-ethinyl estradiol (JINTELI) 1-5 MG-MCG TABS, Take 1 tablet by mouth daily., Disp: , Rfl:  .  pravastatin (PRAVACHOL) 20 MG tablet, Take 1 tablet (20 mg total) by mouth daily., Disp: 90 tablet, Rfl: 0 .  sertraline (ZOLOFT) 50 MG tablet, Take 50 mg by mouth at bedtime. , Disp: , Rfl:  .  zolpidem (AMBIEN) 10 MG tablet, Take 10 mg by mouth at bedtime as needed for sleep. , Disp: , Rfl: :  :  Allergies  Allergen Reactions  . Fish Oil Rash  . Penicillins Rash  :  Family History  Problem Relation Age of Onset  . Hypertension Unknown   . Coronary artery disease Unknown   . Colon cancer Unknown   . Diabetes Unknown   . Heart disease Mother   . Hypertension Mother   . Diabetes Father   . Hypertension Father   :  Social History   Socioeconomic History  . Marital status: Married    Spouse name: Not on file  . Number of children: 1  . Years of education: Not on file  . Highest education level: Not on file  Occupational History  . Occupation: SCHOOL COUNSELOR    Employer: [employer]   Social Needs  . Financial resource strain: Not on file  . Food insecurity:    Worry: Not on file    Inability: Not on file  . Transportation needs:    Medical: Not on file    Non-medical: Not on file  Tobacco Use  . Smoking status: Former Smoker    Last attempt to quit: 10/29/1988    Years since quitting: 29.3  . Smokeless tobacco: Never Used  Substance and Sexual Activity  . Alcohol use: Yes    Alcohol/week: 0.0 oz    Comment: rare  . Drug use: No  . Sexual activity: Yes  Lifestyle  . Physical activity:    Days per week: Not on file    Minutes per session: Not on file  . Stress: Not on file  Relationships  . Social connections:    Talks on phone: Not on file    Gets together: Not on file    Attends religious service: Not on file    Active member of club or organization: Not on file    Attends meetings of clubs or organizations: Not on file    Relationship status: Not on file  . Intimate partner violence:    Fear of current or ex partner: Not on file    Emotionally abused: Not on file    Physically abused: Not on file    Forced sexual activity: Not on file  Other Topics Concern  . Not on file  Social History Narrative  . Not on file  :  Review of Systems  Constitutional: Negative.   HENT: Negative.   Eyes: Negative.   Respiratory: Negative.   Cardiovascular: Negative.   Gastrointestinal: Negative.   Genitourinary: Negative.   Musculoskeletal: Positive for back pain.  Skin: Negative.   Neurological: Negative.   Endo/Heme/Allergies: Negative.   Psychiatric/Behavioral: Negative.      Exam: Well-developed and well-nourished white female in no obvious distress.  Vital signs show temperature of 99.4.  Pulse 75.  Blood pressure 149/78.  Weight is 182 pounds.  Head neck exam shows no ocular or oral lesions.  There are no palpable cervical or supraclavicular lymph nodes.  There is no adenopathy in the cervical or supraclavicular lymph nodes.  Lungs are clear  bilaterally.  Cardiac exam regular rate and rhythm with no murmurs, rubs or bruits.  Abdomen is soft.  She has good bowel sounds.  There is no fluid wave.  There is no palpable liver or spleen tip.  Back  exam shows no tenderness over the spine, ribs or hips.  Extremities shows no clubbing, cyanosis or edema.  Neurological exam shows no focal neurological deficits.  Skin exam shows no rashes, ecchymoses or petechia. @IPVITALS @   Recent Labs    02/14/18 1429  WBC 11.2*  HCT 41.1  PLT 231   No results for input(s): NA, K, CL, CO2, GLUCOSE, BUN, CREATININE, CALCIUM in the last 72 hours.  Blood smear review: Normochromic and normocytic population of red blood cells.  There are no nucleated red blood cells.  I see no teardrop cells.  There are no schistocytes.  She has no inclusion bodies.  White cells are minimally elevated.  She has good maturity of her white blood cells.  I see no hypersegmented polys.  She has no immature myeloid or lymphoid forms.  Platelets are adequate in number and size.  Pathology: None    Assessment and Plan: Ms. Sianez is a very nice 57 year old African-American female.  She has minimal leukocytosis.  She has a slight prominence of lymphocytes.  I suppose she may have CLL.  However, I am not going to send off her blood for flow cytometry as of yet.  Even if she has CLL, there is no indication for therapy.  I cannot find anything on her physical exam that would suggest a hematologic malignancy.  It is possible that the leukocytosis is reactive.  She does have the lower back issues.  Inflammation like this certainly could cause a slight leukocytosis.  At this time, I probably would just get her back in 6 months.  I think this would be very reasonable.  She has had an element of lymphocytosis for several years.  I cannot imagine that her blood counts are going to change significantly.  I spent about 45 minutes with her.  Over 50% of the time was spent face-to-face.  I  reviewed her lab work with her.  I went over my recommendations.  I mostly just reassured her that I thought she was going to be okay.

## 2018-03-04 ENCOUNTER — Other Ambulatory Visit: Payer: Self-pay | Admitting: Physician Assistant

## 2018-04-10 IMAGING — NM NM MISC PROCEDURE
6 series · 36 of 36 positions shown · non-contrast
Comparison: none

[Series 1: rest · 6.51mm/px · 6 of 64 frames shown]
[frame 6/64]
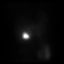
[frame 16/64]
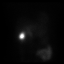
[frame 27/64]
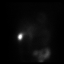
[frame 38/64]
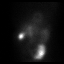
[frame 48/64]
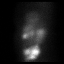
[frame 59/64]
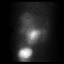

[Series 1: wbr_r-proj_st rest · 6.51mm/px · 6 of 64 frames shown]
[frame 6/64]
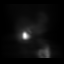
[frame 16/64]
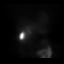
[frame 27/64]
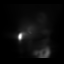
[frame 38/64]
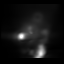
[frame 48/64]
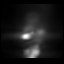
[frame 59/64]
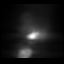

[Series 2: wbr_s-proj_st stress · 6.51mm/px · 6 of 512 frames shown (1 of 2)]
[frame 43/512]
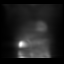
[frame 128/512]
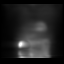
[frame 214/512]
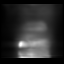
[frame 299/512]
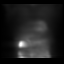
[frame 384/512]
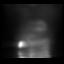
[frame 470/512]
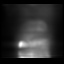

[Series 2: stress · 6.51mm/px · 6 of 512 frames shown (1 of 2)]
[frame 43/512]
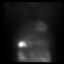
[frame 128/512]
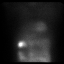
[frame 214/512]
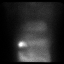
[frame 299/512]
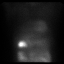
[frame 384/512]
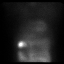
[frame 470/512]
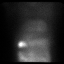

[Series 2: stress · 6.51mm/px · 6 of 64 frames shown (2 of 2)]
[frame 6/64]
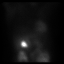
[frame 16/64]
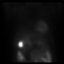
[frame 27/64]
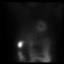
[frame 38/64]
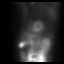
[frame 48/64]
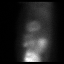
[frame 59/64]
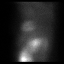

[Series 2: wbr_s-proj_st stress · 6.51mm/px · 6 of 64 frames shown (2 of 2)]
[frame 6/64]
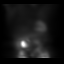
[frame 16/64]
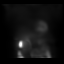
[frame 27/64]
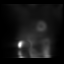
[frame 38/64]
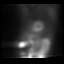
[frame 48/64]
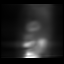
[frame 59/64]
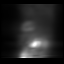

[36 of 36 positions shown; findings below may reference images not displayed]

Canned report from images found in remote index.

Refer to host system for actual result text.

## 2018-04-24 ENCOUNTER — Telehealth: Payer: Self-pay | Admitting: Hematology & Oncology

## 2018-04-24 NOTE — Telephone Encounter (Signed)
Faxed medical records to: [facility] F: [phone] P: [phone]      COPY SCANNED

## 2018-06-10 ENCOUNTER — Other Ambulatory Visit: Payer: Self-pay | Admitting: Physician Assistant

## 2018-07-01 ENCOUNTER — Other Ambulatory Visit: Payer: Self-pay | Admitting: Physician Assistant

## 2018-07-01 DIAGNOSIS — I10 Essential (primary) hypertension: Secondary | ICD-10-CM

## 2018-07-14 ENCOUNTER — Encounter: Payer: Self-pay | Admitting: Physician Assistant

## 2018-07-14 ENCOUNTER — Other Ambulatory Visit: Payer: Self-pay

## 2018-07-14 ENCOUNTER — Ambulatory Visit (INDEPENDENT_AMBULATORY_CARE_PROVIDER_SITE_OTHER): Payer: BC Managed Care – PPO | Admitting: Physician Assistant

## 2018-07-14 VITALS — BP 120/70 | HR 67 | Temp 98.9°F | Resp 14 | Ht 62.0 in | Wt 179.0 lb

## 2018-07-14 DIAGNOSIS — E782 Mixed hyperlipidemia: Secondary | ICD-10-CM

## 2018-07-14 DIAGNOSIS — I10 Essential (primary) hypertension: Secondary | ICD-10-CM | POA: Diagnosis not present

## 2018-07-14 NOTE — Assessment & Plan Note (Signed)
BP normotensive. Asymptomatic. Continue current regimen. Continue diet and exercise. Congratulated patient on all of her hard work.

## 2018-07-14 NOTE — Assessment & Plan Note (Signed)
Will repeat fasting lipids today. Will alter regimen according to results.

## 2018-07-14 NOTE — Progress Notes (Signed)
Patient presents to clinic today for follow-up of hypertension and hyperlipidemia. Patient is currently on a regimen of HCTZ 25 mg QD, Klor-Con 20 mEq QD, and Pravastatin 20 mg daily. Endorses taking medications as directed. Endorses keeping a well-balanced diet. Is working out for 1 hour -- high intensity - 2-3 x daily. Denies. Patient denies chest pain, palpitations, lightheadedness, dizziness, vision changes or frequent headaches.  BP Readings from Last 3 Encounters:  07/14/18 120/70  02/14/18 (!) 149/78  01/13/18 118/78   Past Medical History:  Diagnosis Date  . Allergy   . Anemia   . Anxiety   . Arthritis   . Bipolar disorder ([place])   . Depression   . Hyperlipidemia   . Sleep apnea 07/29/2017    Current Outpatient Medications on File Prior to Visit  Medication Sig Dispense Refill  . ALPRAZolam (XANAX) 0.5 MG tablet alprazolam 0.5 mg tablet    . cyclobenzaprine (FLEXERIL) 10 MG tablet Take 1 tablet (10 mg total) by mouth 3 (three) times daily as needed for muscle spasms. 30 tablet 0  . divalproex (DEPAKOTE ER) 250 MG 24 hr tablet Take 750 mg by mouth at bedtime.  11  . hydrochlorothiazide (HYDRODIURIL) 25 MG tablet TAKE 1 TABLET BY MOUTH EVERY DAY 90 tablet 1  . KLOR-CON M20 20 MEQ tablet TAKE 1 TABLET BY MOUTH EVERY DAY 90 tablet 1  . levocetirizine (XYZAL) 5 MG tablet Take 1 tablet (5 mg total) by mouth every evening. 30 tablet 1  . montelukast (SINGULAIR) 10 MG tablet TAKE 1 TABLET BY MOUTH EVERYDAY AT BEDTIME 90 tablet 0  . norethindrone-ethinyl estradiol (JINTELI) 1-5 MG-MCG TABS Take 1 tablet by mouth daily.    . pravastatin (PRAVACHOL) 20 MG tablet TAKE 1 TABLET BY MOUTH EVERY DAY 90 tablet 0  . sertraline (ZOLOFT) 50 MG tablet Take 50 mg by mouth at bedtime.     Marland Kitchen zolpidem (AMBIEN) 10 MG tablet Take 10 mg by mouth at bedtime as needed for sleep.      No current facility-administered medications on file prior to visit.     Allergies  Allergen Reactions  . Fish Oil  Rash  . Penicillins Rash    Family History  Problem Relation Age of Onset  . Hypertension Unknown   . Coronary artery disease Unknown   . Colon cancer Unknown   . Diabetes Unknown   . Heart disease Mother   . Hypertension Mother   . Diabetes Father   . Hypertension Father     Social History   Socioeconomic History  . Marital status: Married    Spouse name: Not on file  . Number of children: 1  . Years of education: Not on file  . Highest education level: Not on file  Occupational History  . Occupation: SCHOOL COUNSELOR    Employer: [employer]  Social Needs  . Financial resource strain: Not on file  . Food insecurity:    Worry: Not on file    Inability: Not on file  . Transportation needs:    Medical: Not on file    Non-medical: Not on file  Tobacco Use  . Smoking status: Former Smoker    Last attempt to quit: 10/29/1988    Years since quitting: 29.7  . Smokeless tobacco: Never Used  Substance and Sexual Activity  . Alcohol use: Yes    Alcohol/week: 0.0 standard drinks    Comment: rare  . Drug use: No  . Sexual activity: Yes  Lifestyle  .  Physical activity:    Days per week: Not on file    Minutes per session: Not on file  . Stress: Not on file  Relationships  . Social connections:    Talks on phone: Not on file    Gets together: Not on file    Attends religious service: Not on file    Active member of club or organization: Not on file    Attends meetings of clubs or organizations: Not on file    Relationship status: Not on file  Other Topics Concern  . Not on file  Social History Narrative  . Not on file   Review of Systems - See HPI.  All other ROS are negative.  BP 120/70   Pulse 67   Temp 98.9 F (37.2 C) (Oral)   Resp 14   Ht 5\' 2"  (1.575 m)   Wt 179 lb (81.2 kg)   SpO2 99%   BMI 32.74 kg/m   Physical Exam  Constitutional: She is oriented to person, place, and time. She appears well-developed and well-nourished.  HENT:  Head:  Normocephalic and atraumatic.  Eyes: Conjunctivae are normal.  Neck: Neck supple.  Cardiovascular: Normal rate, regular rhythm, normal heart sounds and intact distal pulses.  Pulmonary/Chest: Effort normal and breath sounds normal. No stridor. No respiratory distress. She has no wheezes. She has no rales. She exhibits no tenderness.  Neurological: She is alert and oriented to person, place, and time.  Psychiatric: She has a normal mood and affect.  Vitals reviewed.  Assessment/Plan: Essential hypertension BP normotensive. Asymptomatic. Continue current regimen. Continue diet and exercise. Congratulated patient on all of her hard work.  Hyperlipidemia Will repeat fasting lipids today. Will alter regimen according to results.    Leeanne Rio, PA-C

## 2018-07-14 NOTE — Patient Instructions (Signed)
Please go to the lab today for blood work.  I will call you with your results. We will alter treatment regimen(s) if indicated by your results.   Keep up with diet and exercise! I am proud of you!  Continue current medications as directed.

## 2018-07-15 ENCOUNTER — Other Ambulatory Visit: Payer: Self-pay | Admitting: Physician Assistant

## 2018-07-15 LAB — COMPREHENSIVE METABOLIC PANEL
ALBUMIN: 4.1 g/dL (ref 3.5–5.2)
ALT: 7 U/L (ref 0–35)
AST: 14 U/L (ref 0–37)
Alkaline Phosphatase: 68 U/L (ref 39–117)
BUN: 12 mg/dL (ref 6–23)
CALCIUM: 9.5 mg/dL (ref 8.4–10.5)
CO2: 25 mEq/L (ref 19–32)
CREATININE: 0.6 mg/dL (ref 0.40–1.20)
Chloride: 105 mEq/L (ref 96–112)
GFR: 133.41 mL/min (ref >60.00)
Glucose, Bld: 77 mg/dL (ref 70–99)
Potassium: 3.7 mEq/L (ref 3.5–5.1)
Sodium: 141 mEq/L (ref 135–145)
Total Bilirubin: 0.3 mg/dL (ref 0.2–1.2)
Total Protein: 7.2 g/dL (ref 6.0–8.3)

## 2018-07-15 LAB — LIPID PANEL
Cholesterol: 184 mg/dL (ref 0–200)
HDL: 44.9 mg/dL (ref >39.00)
LDL Cholesterol: 118 mg/dL — ABNORMAL HIGH (ref 0–99)
NONHDL: 138.69
Total CHOL/HDL Ratio: 4
Triglycerides: 104 mg/dL (ref 0.0–149.0)
VLDL: 20.8 mg/dL (ref 0.0–40.0)

## 2018-07-16 ENCOUNTER — Encounter: Payer: Self-pay | Admitting: Emergency Medicine

## 2018-08-14 ENCOUNTER — Inpatient Hospital Stay: Payer: BC Managed Care – PPO | Attending: Family | Admitting: Family

## 2018-08-14 ENCOUNTER — Inpatient Hospital Stay: Payer: BC Managed Care – PPO

## 2018-08-17 ENCOUNTER — Other Ambulatory Visit: Payer: Self-pay | Admitting: Physician Assistant

## 2018-08-17 DIAGNOSIS — I10 Essential (primary) hypertension: Secondary | ICD-10-CM

## 2018-09-30 ENCOUNTER — Other Ambulatory Visit: Payer: Self-pay | Admitting: Physician Assistant

## 2018-10-13 ENCOUNTER — Other Ambulatory Visit: Payer: Self-pay | Admitting: Physician Assistant

## 2019-01-28 ENCOUNTER — Other Ambulatory Visit: Payer: Self-pay | Admitting: Physician Assistant

## 2019-01-28 ENCOUNTER — Telehealth: Payer: Self-pay | Admitting: Emergency Medicine

## 2019-01-28 DIAGNOSIS — I10 Essential (primary) hypertension: Secondary | ICD-10-CM

## 2019-01-28 NOTE — Telephone Encounter (Signed)
LMOVM advising patient to call back to schedule a Virtual Visit with Einar Pheasant for 6 month follow up.

## 2019-01-28 NOTE — Telephone Encounter (Signed)
Patient is due for 6 month follow up

## 2019-02-03 NOTE — Telephone Encounter (Signed)
LMOVM for patient to call back to schedule a Virtual video with PCP

## 2019-02-03 NOTE — Telephone Encounter (Signed)
Appointment scheduled tomorrow morning.

## 2019-02-04 ENCOUNTER — Ambulatory Visit (INDEPENDENT_AMBULATORY_CARE_PROVIDER_SITE_OTHER): Payer: BC Managed Care – PPO | Admitting: Physician Assistant

## 2019-02-04 ENCOUNTER — Other Ambulatory Visit: Payer: Self-pay

## 2019-02-04 ENCOUNTER — Encounter: Payer: Self-pay | Admitting: Physician Assistant

## 2019-02-04 VITALS — BP 160/90 | HR 63

## 2019-02-04 DIAGNOSIS — I10 Essential (primary) hypertension: Secondary | ICD-10-CM

## 2019-02-04 DIAGNOSIS — E782 Mixed hyperlipidemia: Secondary | ICD-10-CM | POA: Diagnosis not present

## 2019-02-04 NOTE — Assessment & Plan Note (Signed)
Previously well-controlled on current regimen. Initial BP today moderately elevated despite taking medications last night per usual. Did have a cup of coffee before checking and has been stressed with work this morning. BP rechecked at end of discussion with patient. Repeat BP at 160/90. Again Asymptomatic. She does note significant increase in caffeine intake. She is to cup back to 1 cup of coffee per day and no more than 6 oz soda (if that). Watch salt intake. She is to take BP later today after relaxing some and send updated BP to MyChart. Check BP BID over the next week. Will call Friday for BP log. If still remaining elevated will need to tweak her medication.

## 2019-02-04 NOTE — Progress Notes (Signed)
I have discussed the procedure for the virtual visit with the patient who has given consent to proceed with assessment and treatment.   Koury Roddy S Quintasha Gren, CMA     

## 2019-02-04 NOTE — Assessment & Plan Note (Signed)
Taking statin medications as directed. Tolerating well. Watching diet. Will have her come in for labs in 2 months once COVID calms down. Will call her to schedule lab appointment.

## 2019-02-04 NOTE — Progress Notes (Signed)
Virtual Visit via Video Note I connected with Linda Kerr on 02/04/19 at  8:30 AM EDT by a video enabled telemedicine application and verified that I am speaking with the correct person using two identifiers.   I discussed the limitations of evaluation and management by telemedicine and the availability of in person appointments. The patient expressed understanding and agreed to proceed.  History of Present Illness: Patient presents today via [facility] for follow-up of hypertension and hyperlipidemia. Patient is currently on a regimen of Hydrochlorothiazide 25 mg daily, Pravastatin 20 mg daily and Klor-Con 20 mEq daily . Patient endorses taking daily as directed. Is keeping up with her exercise regimen. Trying to keep up with diet but notes a little more snacking since being at home. Patient denies chest pain, palpitations, lightheadedness, dizziness, vision changes or frequent headaches. Has not been checking BP at home. Notes some increased stressors while trying to transition to setting things up at home. Limiting caffeine.  New machine at home to check BP this morning for visit. BP initially at 179/93 but asymptomatic. Will have her recheck after resting in a quiet room for 5 minutes.   Observations/Objective: Patient is well-developed, well-nourished in no acute distress.  Resting comfortably in chair at home.  Head is normocephalic, atraumatic.  No labored breathing.  Speech is clear and coherent with logical contest.  Patient is alert and oriented at baseline.   Assessment and Plan: Hyperlipidemia Taking statin medications as directed. Tolerating well. Watching diet. Will have her come in for labs in 2 months once COVID calms down. Will call her to schedule lab appointment.   Essential hypertension Previously well-controlled on current regimen. Initial BP today moderately elevated despite taking medications last night per usual. Did have a cup of coffee before checking and has been  stressed with work this morning. BP rechecked at end of discussion with patient. Repeat BP at 160/90. Again Asymptomatic. She does note significant increase in caffeine intake. She is to cup back to 1 cup of coffee per day and no more than 6 oz soda (if that). Watch salt intake. She is to take BP later today after relaxing some and send updated BP to MyChart. Check BP BID over the next week. Will call Friday for BP log. If still remaining elevated will need to tweak her medication.  Follow Up Instructions: Will plan to follow-up in 2 months for labs only to recheck CMP and Lipids. Follow-up 6 months for CPE.   I discussed the assessment and treatment plan with the patient. The patient was provided an opportunity to ask questions and all were answered. The patient agreed with the plan and demonstrated an understanding of the instructions.   The patient was advised to call back or seek an in-person evaluation if the symptoms worsen or if the condition fails to improve as anticipated.   Leeanne Rio, PA-C

## 2019-02-16 ENCOUNTER — Ambulatory Visit: Payer: Self-pay | Admitting: Psychiatry

## 2019-05-04 ENCOUNTER — Other Ambulatory Visit: Payer: Self-pay | Admitting: Physician Assistant

## 2019-05-04 DIAGNOSIS — I10 Essential (primary) hypertension: Secondary | ICD-10-CM

## 2019-05-05 ENCOUNTER — Other Ambulatory Visit: Payer: Self-pay | Admitting: Physician Assistant

## 2019-06-09 ENCOUNTER — Other Ambulatory Visit: Payer: Self-pay

## 2019-06-09 MED ORDER — DIVALPROEX SODIUM ER 250 MG PO TB24: 750.0000 mg | ORAL_TABLET | Freq: Every day | ORAL | 0 refills | Status: DC

## 2019-06-22 ENCOUNTER — Other Ambulatory Visit: Payer: Self-pay

## 2019-06-22 MED ORDER — SERTRALINE HCL 50 MG PO TABS: 50.0000 mg | ORAL_TABLET | Freq: Every day | ORAL | 0 refills | Status: DC

## 2019-06-22 NOTE — Telephone Encounter (Signed)
Patient's last visit was 01/2018, canceled appt April 2020 due to Covid-19 rescheduled for 07/09/2019. Requesting refill of sertraline 50 mg 1/day

## 2019-07-09 ENCOUNTER — Ambulatory Visit: Payer: Self-pay | Admitting: Psychiatry

## 2019-08-10 ENCOUNTER — Other Ambulatory Visit: Payer: Self-pay

## 2019-08-10 ENCOUNTER — Ambulatory Visit (INDEPENDENT_AMBULATORY_CARE_PROVIDER_SITE_OTHER): Payer: BC Managed Care – PPO | Admitting: Psychiatry

## 2019-08-10 ENCOUNTER — Encounter: Payer: Self-pay | Admitting: Psychiatry

## 2019-08-10 DIAGNOSIS — F3181 Bipolar II disorder: Secondary | ICD-10-CM

## 2019-08-10 DIAGNOSIS — F5105 Insomnia due to other mental disorder: Secondary | ICD-10-CM | POA: Diagnosis not present

## 2019-08-10 DIAGNOSIS — F411 Generalized anxiety disorder: Secondary | ICD-10-CM | POA: Diagnosis not present

## 2019-08-10 MED ORDER — DIVALPROEX SODIUM ER 250 MG PO TB24: 750.0000 mg | ORAL_TABLET | Freq: Every day | ORAL | 3 refills | Status: DC

## 2019-08-10 MED ORDER — SERTRALINE HCL 50 MG PO TABS: 50.0000 mg | ORAL_TABLET | Freq: Every day | ORAL | 3 refills | Status: DC

## 2019-08-10 MED ORDER — ALPRAZOLAM 0.5 MG PO TABS: 0.5000 mg | ORAL_TABLET | Freq: Two times a day (BID) | ORAL | 1 refills | Status: DC | PRN

## 2019-08-10 MED ORDER — ZOLPIDEM TARTRATE 5 MG PO TABS: 5.0000 mg | ORAL_TABLET | Freq: Every evening | ORAL | 1 refills | Status: DC | PRN

## 2019-08-10 NOTE — Progress Notes (Signed)
[name] [id number] 1960-12-17 58 y.o.  Subjective:   Patient ID:  Linda Kerr is a 58 y.o. (DOB Feb 01, 1961) female.  Chief Complaint:  Chief Complaint  Patient presents with  . Follow-up    med managemtn    HPI Linda Kerr presents to the office today for follow-up of bipolar disorder and generalized anxiety disorder.  Last seen February 17, 2018.  No meds were changed.  She has been maintained on Depakote ER 750 mg nightly with sertraline 50 mg daily for seen in our practice in 2003.  Father-in-law who lived with her 109 years and then sister in law died of pancreatic CA this 11-Jun-2023. Covid makes remote learning difficult.  Exercising and eating better. Working to stop sodas.  Early Covid stuck in house and depression and anxiety.  No comments from family. Patient reports stable mood and denies depressed or irritable moods.  Patient reports recent difficulty with anxiety with worry.  In proportion to situation.  Able to function.  Patient denies difficulty with sleep initiation or maintenance. Average 5-6 hours but naps in daytime.  Denies appetite disturbance.  Patient reports that energy and motivation have been good.  Patient denies any difficulty with concentration.  Patient denies any suicidal ideation.  Past Psychiatric Medication Trials: Alprazolam, Ambien 5 mg awakening, clonazepam, sertraline 75, Depakote ER 750, lithium, citalopram, nefazodone, Wellbutrin agitated  Review of Systems:  Review of Systems  Neurological: Negative for tremors and weakness.    Medications: I have reviewed the patient's current medications.  Current Outpatient Medications  Medication Sig Dispense Refill  . cyclobenzaprine (FLEXERIL) 10 MG tablet Take 1 tablet (10 mg total) by mouth 3 (three) times daily as needed for muscle spasms. 30 tablet 0  . divalproex (DEPAKOTE ER) 250 MG 24 hr tablet Take 3 tablets (750 mg total) by mouth at bedtime. 270 tablet 0  . hydrochlorothiazide  (HYDRODIURIL) 25 MG tablet TAKE 1 TABLET BY MOUTH EVERY DAY 90 tablet 0  . KLOR-CON M20 20 MEQ tablet TAKE 1 TABLET BY MOUTH EVERY DAY 90 tablet 0  . levocetirizine (XYZAL) 5 MG tablet Take 1 tablet (5 mg total) by mouth every evening. 30 tablet 1  . montelukast (SINGULAIR) 10 MG tablet TAKE 1 TABLET BY MOUTH EVERYDAY AT BEDTIME 90 tablet 0  . norethindrone-ethinyl estradiol (JINTELI) 1-5 MG-MCG TABS Take 1 tablet by mouth daily.    . pravastatin (PRAVACHOL) 20 MG tablet TAKE 1 TABLET BY MOUTH EVERY DAY 90 tablet 1  . sertraline (ZOLOFT) 50 MG tablet Take 1 tablet (50 mg total) by mouth at bedtime. 90 tablet 0  . ALPRAZolam (XANAX) 0.5 MG tablet alprazolam 0.5 mg tablet    . zolpidem (AMBIEN) 10 MG tablet Take 10 mg by mouth at bedtime as needed for sleep.      No current facility-administered medications for this visit.     Medication Side Effects: None  Allergies:  Allergies  Allergen Reactions  . Fish Oil Rash  . Penicillins Rash    Past Medical History:  Diagnosis Date  . Allergy   . Anemia   . Anxiety   . Arthritis   . Bipolar disorder ([place])   . Depression   . Hyperlipidemia   . Sleep apnea 07/29/2017    Family History  Problem Relation Age of Onset  . Hypertension Unknown   . Coronary artery disease Unknown   . Colon cancer Unknown   . Diabetes Unknown   . Heart disease Mother   .  Hypertension Mother   . Diabetes Father   . Hypertension Father     Social History   Socioeconomic History  . Marital status: Married    Spouse name: Not on file  . Number of children: 1  . Years of education: Not on file  . Highest education level: Not on file  Occupational History  . Occupation: SCHOOL COUNSELOR    Employer: [employer]  Social Needs  . Financial resource strain: Not on file  . Food insecurity    Worry: Not on file    Inability: Not on file  . Transportation needs    Medical: Not on file    Non-medical: Not on file  Tobacco Use  . Smoking  status: Former Smoker    Quit date: 10/29/1988    Years since quitting: 30.8  . Smokeless tobacco: Never Used  Substance and Sexual Activity  . Alcohol use: Yes    Alcohol/week: 0.0 standard drinks    Comment: rare  . Drug use: No  . Sexual activity: Yes  Lifestyle  . Physical activity    Days per week: Not on file    Minutes per session: Not on file  . Stress: Not on file  Relationships  . Social Herbalist on phone: Not on file    Gets together: Not on file    Attends religious service: Not on file    Active member of club or organization: Not on file    Attends meetings of clubs or organizations: Not on file    Relationship status: Not on file  . Intimate partner violence    Fear of current or ex partner: Not on file    Emotionally abused: Not on file    Physically abused: Not on file    Forced sexual activity: Not on file  Other Topics Concern  . Not on file  Social History Narrative  . Not on file    Past Medical History, Surgical history, Social history, and Family history were reviewed and updated as appropriate.   Please see review of systems for further details on the patient's review from today.   Objective:   Physical Exam:  There were no vitals taken for this visit.  Physical Exam Constitutional:      General: She is not in acute distress.    Appearance: She is well-developed.  Musculoskeletal:        General: No deformity.  Neurological:     Mental Status: She is alert and oriented to person, place, and time.     Coordination: Coordination normal.  Psychiatric:        Attention and Perception: Attention and perception normal. She does not perceive auditory or visual hallucinations.        Mood and Affect: Mood is anxious. Mood is not depressed. Affect is not labile, blunt, angry or inappropriate.        Speech: Speech normal.        Behavior: Behavior normal.        Thought Content: Thought content normal. Thought content does not include  homicidal or suicidal ideation. Thought content does not include homicidal or suicidal plan.        Cognition and Memory: Cognition and memory normal.        Judgment: Judgment normal.     Comments: Insight intact. No delusions.       Lab Review:     Component Value Date/Time   NA 141 07/14/2018 1632  K 3.7 07/14/2018 1632   CL 105 07/14/2018 1632   CO2 25 07/14/2018 1632   GLUCOSE 77 07/14/2018 1632   BUN 12 07/14/2018 1632   CREATININE 0.60 07/14/2018 1632   CREATININE 0.65 01/13/2018 1645   CALCIUM 9.5 07/14/2018 1632   PROT 7.2 07/14/2018 1632   ALBUMIN 4.1 07/14/2018 1632   AST 14 07/14/2018 1632   ALT 7 07/14/2018 1632   ALKPHOS 68 07/14/2018 1632   BILITOT 0.3 07/14/2018 1632   GFRNONAA 130.17 07/19/2010 0945   GFRAA 117 01/31/2007 1108       Component Value Date/Time   WBC 11.2 (H) 02/14/2018 1429   WBC 12.2 (H) 01/13/2018 1645   RBC 4.39 02/14/2018 1429   HGB 13.7 02/14/2018 1429   HCT 41.1 02/14/2018 1429   PLT 231 02/14/2018 1429   MCV 93.6 02/14/2018 1429   MCH 31.2 02/14/2018 1429   MCHC 33.3 02/14/2018 1429   RDW 13.1 02/14/2018 1429   LYMPHSABS 5.5 (H) 02/14/2018 1429   MONOABS 1.1 (H) 02/14/2018 1429   EOSABS 0.1 02/14/2018 1429   BASOSABS 0.0 02/14/2018 1429    No results found for: POCLITH, LITHIUM   Lab Results  Component Value Date   VALPROATE 58.2 12/06/2016     .res Assessment: Plan:    Pegi was seen today for follow-up.  Diagnoses and all orders for this visit:  Bipolar II disorder ([place])  Generalized anxiety disorder   Disc bipolar and anxiety disorder and her fears of daughter 30 yo inheriting it.     Consider vitamin D bc limited sun exposure.   No problems with meds.  No med changes.  Continue Depakote ER 750 mg HS Sertraline 50 mg daily.  FU 1 year bc stable  Lynder Parents, MD, DFAPA     Please see After Visit Summary for patient specific instructions.  No future appointments.  No orders of the defined  types were placed in this encounter.   -------------------------------

## 2019-08-18 ENCOUNTER — Other Ambulatory Visit: Payer: Self-pay

## 2019-08-18 DIAGNOSIS — Z20822 Contact with and (suspected) exposure to covid-19: Secondary | ICD-10-CM

## 2019-08-19 LAB — NOVEL CORONAVIRUS, NAA: SARS-CoV-2, NAA: NOT DETECTED

## 2019-10-17 ENCOUNTER — Other Ambulatory Visit: Payer: Self-pay | Admitting: Physician Assistant

## 2019-10-17 DIAGNOSIS — I10 Essential (primary) hypertension: Secondary | ICD-10-CM

## 2019-10-20 NOTE — Telephone Encounter (Signed)
Patient is due for CPE. Last OV was 01/2019. If she doesn't want to do a CPE we can schedule a follow up appointment for blood pressure

## 2019-11-04 ENCOUNTER — Other Ambulatory Visit: Payer: Self-pay

## 2019-11-04 ENCOUNTER — Encounter: Payer: Self-pay | Admitting: Physician Assistant

## 2019-11-04 ENCOUNTER — Ambulatory Visit (INDEPENDENT_AMBULATORY_CARE_PROVIDER_SITE_OTHER): Payer: BC Managed Care – PPO | Admitting: Physician Assistant

## 2019-11-04 VITALS — BP 150/88

## 2019-11-04 DIAGNOSIS — I10 Essential (primary) hypertension: Secondary | ICD-10-CM

## 2019-11-04 DIAGNOSIS — E782 Mixed hyperlipidemia: Secondary | ICD-10-CM

## 2019-11-04 DIAGNOSIS — Z0000 Encounter for general adult medical examination without abnormal findings: Secondary | ICD-10-CM

## 2019-11-04 DIAGNOSIS — F3170 Bipolar disorder, currently in remission, most recent episode unspecified: Secondary | ICD-10-CM

## 2019-11-04 DIAGNOSIS — R7303 Prediabetes: Secondary | ICD-10-CM

## 2019-11-04 MED ORDER — HYDROCHLOROTHIAZIDE 25 MG PO TABS: 25.0000 mg | ORAL_TABLET | Freq: Every day | ORAL | 1 refills | Status: DC

## 2019-11-04 MED ORDER — POTASSIUM CHLORIDE CRYS ER 20 MEQ PO TBCR: 20.0000 meq | EXTENDED_RELEASE_TABLET | Freq: Every day | ORAL | 1 refills | Status: DC

## 2019-11-04 NOTE — Progress Notes (Signed)
I have discussed the procedure for the virtual visit with the patient who has given consent to proceed with assessment and treatment.   Savi Lastinger S Jamella Grayer, CMA     

## 2019-11-04 NOTE — Progress Notes (Signed)
Virtual Visit via Video   I connected with patient on 11/04/19 at  3:30 PM EST by a video enabled telemedicine application and verified that I am speaking with the correct person using two identifiers.  Location patient: Home Location provider: [facility], Office Persons participating in the virtual visit: Patient, Provider, Nash (Patina Moore)  I discussed the limitations of evaluation and management by telemedicine and the availability of in person appointments. The patient expressed understanding and agreed to proceed.  Subjective:   HPI:   Patient presents via [facility]oxy[facility]Me today for CPE. Recently lost her sister-in-law (pancreatic cancer) but is managing well overall. Notes the holiday was tough.   Recently had her mammogram and PAP smear with GYN (Dr. Bobbye Charleston). Normal per patient. Records requested.   Hypertension  -- Patient is currently on a regimen of HCTZ 25 mg QD and Klor-Con 20 mEq QD. Was previously on additional agents but wanted to wean off and work on [place]. Patient denies chest pain, palpitations, lightheadedness, dizziness, vision changes or frequent headaches. .   Bipolar Disorder-- Followed by Psychiatry (Dr. Tandy Gaw). Is currently on a regimen of Depakote 750 mg QD, Sertraline 50 mg QD, and Alprazolam PRN. Has Rx for Ambien for sleep from this provider. Notes mood and sleep are stable on this regimen. Denies significant breakthrough symptoms. Denies SI/HI. Follow-up yearly now.    ROS:  Review of Systems  Constitutional: Negative for fever and weight loss.  HENT: Negative for ear discharge, ear pain, hearing loss and tinnitus.   Eyes: Negative for blurred vision, double vision, photophobia and pain.  Respiratory: Negative for cough and shortness of breath.   Cardiovascular: Negative for chest pain and palpitations.  Gastrointestinal: Negative for abdominal pain, blood in stool, constipation, diarrhea, heartburn, melena, nausea and vomiting.    Genitourinary: Negative for dysuria, flank pain, frequency, hematuria and urgency.  Musculoskeletal: Negative for falls.  Neurological: Negative for dizziness, loss of consciousness and headaches.  Endo/Heme/Allergies: Negative for environmental allergies.  Psychiatric/Behavioral: Negative for depression, hallucinations, substance abuse and suicidal ideas. The patient is not nervous/anxious and does not have insomnia.     Patient Active Problem List   Diagnosis Date Noted  . Cervical cancer screening 01/14/2018  . Breast cancer screening 01/14/2018  . Need for Tdap vaccination 01/14/2018  . Essential hypertension 11/18/2017  . Sleep apnea 07/29/2017  . Borderline diabetes 07/29/2017  . Visit for preventive health examination 12/06/2016  . Thyromegaly 12/06/2016  . Hyperlipidemia 05/11/2015  . Bipolar affective disorder in remission ([place]) 05/11/2015  . BACK PAIN, CHRONIC 09/26/2007    Social History   Tobacco Use  . Smoking status: Former Smoker    Quit date: 10/29/1988    Years since quitting: 31.0  . Smokeless tobacco: Never Used  Substance Use Topics  . Alcohol use: Yes    Alcohol/week: 0.0 standard drinks    Comment: rare    Current Outpatient Medications:  .  ALPRAZolam (XANAX) 0.5 MG tablet, Take 1 tablet (0.5 mg total) by mouth 2 (two) times daily as needed for anxiety., Disp: 30 tablet, Rfl: 1 .  cyclobenzaprine (FLEXERIL) 10 MG tablet, Take 1 tablet (10 mg total) by mouth 3 (three) times daily as needed for muscle spasms., Disp: 30 tablet, Rfl: 0 .  divalproex (DEPAKOTE ER) 250 MG 24 hr tablet, Take 3 tablets (750 mg total) by mouth at bedtime., Disp: 270 tablet, Rfl: 3 .  hydrochlorothiazide (HYDRODIURIL) 25 MG tablet, Take 1 tablet (25 mg total) by mouth  daily. PLEASE SCHEDULE A PHYSICAL FOR REFILLS, Disp: 30 tablet, Rfl: 0 .  KLOR-CON M20 20 MEQ tablet, TAKE 1 TABLET BY MOUTH EVERY DAY, Disp: 90 tablet, Rfl: 0 .  NON FORMULARY, [place], Disp: , Rfl:  .   norethindrone-ethinyl estradiol (JINTELI) 1-5 MG-MCG TABS, Take 1 tablet by mouth daily., Disp: , Rfl:  .  pravastatin (PRAVACHOL) 20 MG tablet, TAKE 1 TABLET BY MOUTH EVERY DAY, Disp: 90 tablet, Rfl: 1 .  sertraline (ZOLOFT) 50 MG tablet, Take 1 tablet (50 mg total) by mouth at bedtime., Disp: 90 tablet, Rfl: 3 .  zolpidem (AMBIEN) 5 MG tablet, Take 1 tablet (5 mg total) by mouth at bedtime as needed for sleep., Disp: 30 tablet, Rfl: 1  Allergies  Allergen Reactions  . Fish Oil Rash  . Penicillins Rash    Objective:   BP (!) 150/88   Patient is well-developed, well-nourished in no acute distress.  Resting comfortably at home.  Head is normocephalic, atraumatic.  No labored breathing.  Speech is clear and coherent with logical contest.  Patient is alert and oriented at baseline.   Assessment and Plan:   1. Visit for preventive health examination Depression screen negative. Health Maintenance reviewed. Preventive schedule discussed and handout given in AVS. Will obtain fasting labs today.  - Hemoglobin A1c; Future - CBC w/Diff; Future - Comp Met (CMET); Future - Lipid Profile; Future  2. Essential hypertension BP above goal today. She does not want to add on additional agents. Discuss she will need to work harder on diet and exercise. DASH diet reviewed. Handout given. 150+ minutes of moderate-intensity aerobic activity per week recommended. Lab appt scheduled for fasting labs noted below. She is to keep a tab of home BP readings for review.  - Hemoglobin A1c; Future - Comp Met (CMET); Future - Lipid Profile; Future - hydrochlorothiazide (HYDRODIURIL) 25 MG tablet; Take 1 tablet (25 mg total) by mouth daily.  Dispense: 90 tablet; Refill: 1 - potassium chloride SA (KLOR-CON M20) 20 MEQ tablet; Take 1 tablet (20 mEq total) by mouth daily.  Dispense: 90 tablet; Refill: 1  3. Mixed hyperlipidemia Repeat fasting lipids. - Hemoglobin A1c; Future - Comp Met (CMET); Future -  Lipid Profile; Future  4. Borderline diabetes Prior history. Dietary and exercise recommendations reviewed and given in handout to MyChart. Will check A1C along with her CMP and Lipids. - Hemoglobin A1c; Future - Comp Met (CMET); Future - Lipid Profile; Future  5. Bipolar affective disorder in remission ([place]) Stable. Continue management per Psychiatry.  - CBC w/Diff; Future .   Leeanne Rio, PA-C 11/04/2019

## 2019-11-04 NOTE — Patient Instructions (Addendum)
Instructions sent to MyChart.   Please come in as scheduled for labs. We will call with results.   Keep up with current medication regimen. Need at least 150 minutes of aerobic activity per week.  Keep working on low-salt diet (see below). Check BP at home periodically. If not improving with lifestyle changes we will need to alter your medication regimen.   Hang in there!   DASH Eating Plan DASH stands for "Dietary Approaches to Stop Hypertension." The DASH eating plan is a healthy eating plan that has been shown to reduce high blood pressure (hypertension). It may also reduce your risk for type 2 diabetes, heart disease, and stroke. The DASH eating plan may also help with weight loss. What are tips for following this plan?  General guidelines  Avoid eating more than 2,300 mg (milligrams) of salt (sodium) a day. If you have hypertension, you may need to reduce your sodium intake to 1,500 mg a day.  Limit alcohol intake to no more than 1 drink a day for nonpregnant women and 2 drinks a day for men. One drink equals 12 oz of beer, 5 oz of wine, or 1 oz of hard liquor.  Work with your health care provider to maintain a healthy body weight or to lose weight. Ask what an ideal weight is for you.  Get at least 30 minutes of exercise that causes your heart to beat faster (aerobic exercise) most days of the week. Activities may include walking, swimming, or biking.  Work with your health care provider or diet and nutrition specialist (dietitian) to adjust your eating plan to your individual calorie needs. Reading food labels   Check food labels for the amount of sodium per serving. Choose foods with less than 5 percent of the Daily Value of sodium. Generally, foods with less than 300 mg of sodium per serving fit into this eating plan.  To find whole grains, look for the word "whole" as the first word in the ingredient list. Shopping  Buy products labeled as "low-sodium" or "no salt  added."  Buy fresh foods. Avoid canned foods and premade or frozen meals. Cooking  Avoid adding salt when cooking. Use salt-free seasonings or herbs instead of table salt or sea salt. Check with your health care provider or pharmacist before using salt substitutes.  Do not fry foods. Cook foods using healthy methods such as baking, boiling, grilling, and broiling instead.  Cook with heart-healthy oils, such as olive, canola, soybean, or sunflower oil. Meal planning  Eat a balanced diet that includes: ? 5 or more servings of fruits and vegetables each day. At each meal, try to fill half of your plate with fruits and vegetables. ? Up to 6-8 servings of whole grains each day. ? Less than 6 oz of lean meat, poultry, or fish each day. A 3-oz serving of meat is about the same size as a deck of cards. One egg equals 1 oz. ? 2 servings of low-fat dairy each day. ? A serving of nuts, seeds, or beans 5 times each week. ? Heart-healthy fats. Healthy fats called Omega-3 fatty acids are found in foods such as flaxseeds and coldwater fish, like sardines, salmon, and mackerel.  Limit how much you eat of the following: ? Canned or prepackaged foods. ? Food that is high in trans fat, such as fried foods. ? Food that is high in saturated fat, such as fatty meat. ? Sweets, desserts, sugary drinks, and other foods with added sugar. ? Full-fat  dairy products.  Do not salt foods before eating.  Try to eat at least 2 vegetarian meals each week.  Eat more home-cooked food and less restaurant, buffet, and fast food.  When eating at a restaurant, ask that your food be prepared with less salt or no salt, if possible. What foods are recommended? The items listed may not be a complete list. Talk with your dietitian about what dietary choices are best for you. Grains Whole-grain or whole-wheat bread. Whole-grain or whole-wheat pasta. Brown rice. [place]. Bulgur. Whole-grain and low-sodium cereals.  Pita bread. Low-fat, low-sodium crackers. Whole-wheat flour tortillas. Vegetables Fresh or frozen vegetables (raw, steamed, roasted, or grilled). Low-sodium or reduced-sodium tomato and vegetable juice. Low-sodium or reduced-sodium tomato sauce and tomato paste. Low-sodium or reduced-sodium canned vegetables. Fruits All fresh, dried, or frozen fruit. Canned fruit in natural juice (without added sugar). Meat and other protein foods Skinless chicken or [place]. Ground chicken or [place]. Pork with fat trimmed off. Fish and seafood. Egg whites. Dried beans, peas, or lentils. Unsalted nuts, nut butters, and seeds. Unsalted canned beans. Lean cuts of beef with fat trimmed off. Low-sodium, lean deli meat. Dairy Low-fat (1%) or fat-free (skim) milk. Fat-free, low-fat, or reduced-fat cheeses. Nonfat, low-sodium ricotta or cottage cheese. Low-fat or nonfat yogurt. Low-fat, low-sodium cheese. Fats and oils Soft margarine without trans fats. Vegetable oil. Low-fat, reduced-fat, or light mayonnaise and salad dressings (reduced-sodium). Canola, safflower, olive, soybean, and sunflower oils. Avocado. Seasoning and other foods Herbs. Spices. Seasoning mixes without salt. Unsalted popcorn and pretzels. Fat-free sweets. What foods are not recommended? The items listed may not be a complete list. Talk with your dietitian about what dietary choices are best for you. Grains Baked goods made with fat, such as croissants, muffins, or some breads. Dry pasta or rice meal packs. Vegetables Creamed or fried vegetables. Vegetables in a cheese sauce. Regular canned vegetables (not low-sodium or reduced-sodium). Regular canned tomato sauce and paste (not low-sodium or reduced-sodium). Regular tomato and vegetable juice (not low-sodium or reduced-sodium). Angie Fava. Olives. Fruits Canned fruit in a light or heavy syrup. Fried fruit. Fruit in cream or butter sauce. Meat and other protein foods Fatty cuts of meat. Ribs. Fried  meat. Berniece Salines. Sausage. Bologna and other processed lunch meats. Salami. Fatback. Hotdogs. Bratwurst. Salted nuts and seeds. Canned beans with added salt. Canned or smoked fish. Whole eggs or egg yolks. Chicken or [place] with skin. Dairy Whole or 2% milk, cream, and half-and-half. Whole or full-fat cream cheese. Whole-fat or sweetened yogurt. Full-fat cheese. Nondairy creamers. Whipped toppings. Processed cheese and cheese spreads. Fats and oils Butter. Stick margarine. Lard. Shortening. Ghee. Bacon fat. Tropical oils, such as coconut, palm kernel, or palm oil. Seasoning and other foods Salted popcorn and pretzels. Onion salt, garlic salt, seasoned salt, table salt, and sea salt. Worcestershire sauce. Tartar sauce. Barbecue sauce. Teriyaki sauce. Soy sauce, including reduced-sodium. Steak sauce. Canned and packaged gravies. Fish sauce. Oyster sauce. Cocktail sauce. Horseradish that you find on the shelf. Ketchup. Mustard. Meat flavorings and tenderizers. Bouillon cubes. Hot sauce and Tabasco sauce. Premade or packaged marinades. Premade or packaged taco seasonings. Relishes. Regular salad dressings. Where to find more information:  National Heart, Lung, and [place]: https://wilson-eaton.com/  American Heart Association: www.heart.org Summary  The DASH eating plan is a healthy eating plan that has been shown to reduce high blood pressure (hypertension). It may also reduce your risk for type 2 diabetes, heart disease, and stroke.  With the DASH eating plan, you should  limit salt (sodium) intake to 2,300 mg a day. If you have hypertension, you may need to reduce your sodium intake to 1,500 mg a day.  When on the DASH eating plan, aim to eat more fresh fruits and vegetables, whole grains, lean proteins, low-fat dairy, and heart-healthy fats.  Work with your health care provider or diet and nutrition specialist (dietitian) to adjust your eating plan to your individual calorie needs. This information is  not intended to replace advice given to you by your health care provider. Make sure you discuss any questions you have with your health care provider. Document Revised: 09/27/2017 Document Reviewed: 10/08/2016 Elsevier Patient Education  2020 Elsevier Inc.   Preventive Care 7-52 Years Old, Female Preventive care refers to visits with your health care provider and lifestyle choices that can promote health and wellness. This includes:  A yearly physical exam. This may also be called an annual well check.  Regular dental visits and eye exams.  Immunizations.  Screening for certain conditions.  Healthy lifestyle choices, such as eating a healthy diet, getting regular exercise, not using drugs or products that contain nicotine and tobacco, and limiting alcohol use. What can I expect for my preventive care visit? Physical exam Your health care provider will check your:  Height and weight. This may be used to calculate body mass index (BMI), which tells if you are at a healthy weight.  Heart rate and blood pressure.  Skin for abnormal spots. Counseling Your health care provider may ask you questions about your:  Alcohol, tobacco, and drug use.  Emotional well-being.  Home and relationship well-being.  Sexual activity.  Eating habits.  Work and work Statistician.  Method of birth control.  Menstrual cycle.  Pregnancy history. What immunizations do I need?  Influenza (flu) vaccine  This is recommended every year. Tetanus, diphtheria, and pertussis (Tdap) vaccine  You may need a Td booster every 10 years. Varicella (chickenpox) vaccine  You may need this if you have not been vaccinated. Zoster (shingles) vaccine  You may need this after age 31. Measles, mumps, and rubella (MMR) vaccine  You may need at least one dose of MMR if you were born in 1957 or later. You may also need a second dose. Pneumococcal conjugate (PCV13) vaccine  You may need this if you have  certain conditions and were not previously vaccinated. Pneumococcal polysaccharide (PPSV23) vaccine  You may need one or two doses if you smoke cigarettes or if you have certain conditions. Meningococcal conjugate (MenACWY) vaccine  You may need this if you have certain conditions. Hepatitis A vaccine  You may need this if you have certain conditions or if you travel or work in places where you may be exposed to hepatitis A. Hepatitis B vaccine  You may need this if you have certain conditions or if you travel or work in places where you may be exposed to hepatitis B. Haemophilus influenzae type b (Hib) vaccine  You may need this if you have certain conditions. Human papillomavirus (HPV) vaccine  If recommended by your health care provider, you may need three doses over 6 months. You may receive vaccines as individual doses or as more than one vaccine together in one shot (combination vaccines). Talk with your health care provider about the risks and benefits of combination vaccines. What tests do I need? Blood tests  Lipid and cholesterol levels. These may be checked every 5 years, or more frequently if you are over 29 years old.  Hepatitis  C test.  Hepatitis B test. Screening  Lung cancer screening. You may have this screening every year starting at age 29 if you have a 30-pack-year history of smoking and currently smoke or have quit within the past 15 years.  Colorectal cancer screening. All adults should have this screening starting at age 61 and continuing until age 7. Your health care provider may recommend screening at age 64 if you are at increased risk. You will have tests every 1-10 years, depending on your results and the type of screening test.  Diabetes screening. This is done by checking your blood sugar (glucose) after you have not eaten for a while (fasting). You may have this done every 1-3 years.  Mammogram. This may be done every 1-2 years. Talk with your  health care provider about when you should start having regular mammograms. This may depend on whether you have a family history of breast cancer.  BRCA-related cancer screening. This may be done if you have a family history of breast, ovarian, tubal, or peritoneal cancers.  Pelvic exam and Pap test. This may be done every 3 years starting at age 43. Starting at age 28, this may be done every 5 years if you have a Pap test in combination with an HPV test. Other tests  Sexually transmitted disease (STD) testing.  Bone density scan. This is done to screen for osteoporosis. You may have this scan if you are at high risk for osteoporosis. Follow these instructions at home: Eating and drinking  Eat a diet that includes fresh fruits and vegetables, whole grains, lean protein, and low-fat dairy.  Take vitamin and mineral supplements as recommended by your health care provider.  Do not drink alcohol if: ? Your health care provider tells you not to drink. ? You are pregnant, may be pregnant, or are planning to become pregnant.  If you drink alcohol: ? Limit how much you have to 0-1 drink a day. ? Be aware of how much alcohol is in your drink. In the [place] one drink equals one 12 oz bottle of beer (355 mL), one 5 oz glass of wine (148 mL), or one 1 oz glass of hard liquor (44 mL). Lifestyle  Take daily care of your teeth and gums.  Stay active. Exercise for at least 30 minutes on 5 or more days each week.  Do not use any products that contain nicotine or tobacco, such as cigarettes, e-cigarettes, and chewing tobacco. If you need help quitting, ask your health care provider.  If you are sexually active, practice safe sex. Use a condom or other form of birth control (contraception) in order to prevent pregnancy and STIs (sexually transmitted infections).  If told by your health care provider, take low-dose aspirin daily starting at age 50. What's next?  Visit your health care provider once  a year for a well check visit.  Ask your health care provider how often you should have your eyes and teeth checked.  Stay up to date on all vaccines. This information is not intended to replace advice given to you by your health care provider. Make sure you discuss any questions you have with your health care provider. Document Revised: 06/26/2018 Document Reviewed: 06/26/2018 Elsevier Patient Education  2020 [employer].

## 2019-11-13 ENCOUNTER — Other Ambulatory Visit: Payer: Self-pay

## 2019-11-13 ENCOUNTER — Ambulatory Visit (INDEPENDENT_AMBULATORY_CARE_PROVIDER_SITE_OTHER): Payer: BC Managed Care – PPO | Admitting: Emergency Medicine

## 2019-11-13 DIAGNOSIS — F3170 Bipolar disorder, currently in remission, most recent episode unspecified: Secondary | ICD-10-CM

## 2019-11-13 DIAGNOSIS — I10 Essential (primary) hypertension: Secondary | ICD-10-CM

## 2019-11-13 DIAGNOSIS — Z0000 Encounter for general adult medical examination without abnormal findings: Secondary | ICD-10-CM | POA: Diagnosis not present

## 2019-11-13 DIAGNOSIS — E782 Mixed hyperlipidemia: Secondary | ICD-10-CM

## 2019-11-13 DIAGNOSIS — R7303 Prediabetes: Secondary | ICD-10-CM

## 2019-11-14 LAB — LIPID PANEL
Cholesterol: 177 mg/dL (ref <200)
HDL: 41 mg/dL — ABNORMAL LOW (ref >=50)
LDL Cholesterol (Calc): 119 mg/dL (calc) — ABNORMAL HIGH
Non-HDL Cholesterol (Calc): 136 mg/dL (calc) — ABNORMAL HIGH (ref <130)
Total CHOL/HDL Ratio: 4.3 (calc) (ref <5.0)
Triglycerides: 71 mg/dL (ref <150)

## 2019-11-14 LAB — COMPREHENSIVE METABOLIC PANEL
AG Ratio: 1.5 (calc) (ref 1.0–2.5)
ALT: 11 U/L (ref 6–29)
AST: 17 U/L (ref 10–35)
Albumin: 4.1 g/dL (ref 3.6–5.1)
Alkaline phosphatase (APISO): 63 U/L (ref 37–153)
BUN: 14 mg/dL (ref 7–25)
CO2: 28 mmol/L (ref 20–32)
Calcium: 9.2 mg/dL (ref 8.6–10.4)
Chloride: 101 mmol/L (ref 98–110)
Creat: 0.6 mg/dL (ref 0.50–1.05)
Globulin: 2.7 g/dL (calc) (ref 1.9–3.7)
Glucose, Bld: 77 mg/dL (ref 65–99)
Potassium: 3.7 mmol/L (ref 3.5–5.3)
Sodium: 139 mmol/L (ref 135–146)
Total Bilirubin: 0.4 mg/dL (ref 0.2–1.2)
Total Protein: 6.8 g/dL (ref 6.1–8.1)

## 2019-11-14 LAB — CBC WITH DIFFERENTIAL/PLATELET
Absolute Monocytes: 955 cells/uL — ABNORMAL HIGH (ref 200–950)
Basophils Absolute: 35 cells/uL (ref 0–200)
Basophils Relative: 0.3 %
Eosinophils Absolute: 115 cells/uL (ref 15–500)
Eosinophils Relative: 1 %
HCT: 38.8 % (ref 35.0–45.0)
Hemoglobin: 13.1 g/dL (ref 11.7–15.5)
Lymphs Abs: 6900 cells/uL — ABNORMAL HIGH (ref 850–3900)
MCH: 30.5 pg (ref 27.0–33.0)
MCHC: 33.8 g/dL (ref 32.0–36.0)
MCV: 90.4 fL (ref 80.0–100.0)
MPV: 11 fL (ref 7.5–12.5)
Monocytes Relative: 8.3 %
Neutro Abs: 3496 cells/uL (ref 1500–7800)
Neutrophils Relative %: 30.4 %
Platelets: 219 10*3/uL (ref 140–400)
RBC: 4.29 10*6/uL (ref 3.80–5.10)
RDW: 12.5 % (ref 11.0–15.0)
Total Lymphocyte: 60 %
WBC: 11.5 10*3/uL — ABNORMAL HIGH (ref 3.8–10.8)

## 2019-11-14 LAB — HEMOGLOBIN A1C
Hgb A1c MFr Bld: 5.9 % of total Hgb — ABNORMAL HIGH (ref <5.7)
Mean Plasma Glucose: 123 (calc)
eAG (mmol/L): 6.8 (calc)

## 2019-11-17 ENCOUNTER — Other Ambulatory Visit: Payer: Self-pay | Admitting: Emergency Medicine

## 2019-11-17 DIAGNOSIS — D729 Disorder of white blood cells, unspecified: Secondary | ICD-10-CM

## 2019-11-24 ENCOUNTER — Other Ambulatory Visit: Payer: Self-pay | Admitting: Family

## 2019-11-24 DIAGNOSIS — D72829 Elevated white blood cell count, unspecified: Secondary | ICD-10-CM

## 2019-11-24 DIAGNOSIS — C9110 Chronic lymphocytic leukemia of B-cell type not having achieved remission: Secondary | ICD-10-CM

## 2019-11-25 ENCOUNTER — Inpatient Hospital Stay: Payer: BC Managed Care – PPO | Attending: Family

## 2019-11-25 ENCOUNTER — Other Ambulatory Visit: Payer: Self-pay

## 2019-11-25 ENCOUNTER — Encounter: Payer: Self-pay | Admitting: Family

## 2019-11-25 ENCOUNTER — Inpatient Hospital Stay (HOSPITAL_BASED_OUTPATIENT_CLINIC_OR_DEPARTMENT_OTHER): Payer: BC Managed Care – PPO | Admitting: Family

## 2019-11-25 VITALS — BP 143/89 | HR 70 | Temp 97.5°F | Resp 18 | Ht 62.5 in | Wt 187.0 lb

## 2019-11-25 DIAGNOSIS — Z79899 Other long term (current) drug therapy: Secondary | ICD-10-CM | POA: Diagnosis not present

## 2019-11-25 DIAGNOSIS — D72829 Elevated white blood cell count, unspecified: Secondary | ICD-10-CM | POA: Diagnosis present

## 2019-11-25 DIAGNOSIS — G8929 Other chronic pain: Secondary | ICD-10-CM | POA: Diagnosis not present

## 2019-11-25 DIAGNOSIS — M545 Low back pain: Secondary | ICD-10-CM | POA: Insufficient documentation

## 2019-11-25 DIAGNOSIS — C9110 Chronic lymphocytic leukemia of B-cell type not having achieved remission: Secondary | ICD-10-CM

## 2019-11-25 LAB — CBC WITH DIFFERENTIAL (CANCER CENTER ONLY)
Abs Immature Granulocytes: 0.02 10*3/uL (ref 0.00–0.07)
Basophils Absolute: 0.1 10*3/uL (ref 0.0–0.1)
Basophils Relative: 0 %
Eosinophils Absolute: 0.1 10*3/uL (ref 0.0–0.5)
Eosinophils Relative: 1 %
HCT: 40 % (ref 36.0–46.0)
Hemoglobin: 13.1 g/dL (ref 12.0–15.0)
Immature Granulocytes: 0 %
Lymphocytes Relative: 58 %
Lymphs Abs: 6.7 10*3/uL — ABNORMAL HIGH (ref 0.7–4.0)
MCH: 30 pg (ref 26.0–34.0)
MCHC: 32.8 g/dL (ref 30.0–36.0)
MCV: 91.7 fL (ref 80.0–100.0)
Monocytes Absolute: 0.8 10*3/uL (ref 0.1–1.0)
Monocytes Relative: 7 %
Neutro Abs: 4 10*3/uL (ref 1.7–7.7)
Neutrophils Relative %: 34 %
Platelet Count: 206 10*3/uL (ref 150–400)
RBC: 4.36 MIL/uL (ref 3.87–5.11)
RDW: 12.5 % (ref 11.5–15.5)
WBC Count: 11.6 10*3/uL — ABNORMAL HIGH (ref 4.0–10.5)
nRBC: 0 % (ref 0.0–0.2)

## 2019-11-25 LAB — CMP (CANCER CENTER ONLY)
ALT: 10 U/L (ref 0–44)
AST: 15 U/L (ref 15–41)
Albumin: 4.2 g/dL (ref 3.5–5.0)
Alkaline Phosphatase: 60 U/L (ref 38–126)
Anion gap: 7 (ref 5–15)
BUN: 13 mg/dL (ref 6–20)
CO2: 30 mmol/L (ref 22–32)
Calcium: 9.5 mg/dL (ref 8.9–10.3)
Chloride: 104 mmol/L (ref 98–111)
Creatinine: 0.67 mg/dL (ref 0.44–1.00)
GFR, Est AFR Am: >60 mL/min (ref >60)
GFR, Estimated: >60 mL/min (ref >60)
Glucose, Bld: 82 mg/dL (ref 70–99)
Potassium: 3.9 mmol/L (ref 3.5–5.1)
Sodium: 141 mmol/L (ref 135–145)
Total Bilirubin: 0.4 mg/dL (ref 0.3–1.2)
Total Protein: 7.3 g/dL (ref 6.5–8.1)

## 2019-11-25 LAB — SAVE SMEAR(SSMR), FOR PROVIDER SLIDE REVIEW

## 2019-11-25 NOTE — Progress Notes (Signed)
Hematology and Oncology Follow Up Visit  Linda Kerr [id number] 12-14-1960 59 y.o. 11/25/2019   Principle Diagnosis:  Leukocytosis - questionable reactive vs. CLL  Current Therapy:   Observation   Interim History:  Linda Kerr is here today for follow-up. She is doing well but has had some fatigue. She has a stressful job as a Animal nutritionist and also stresses some due to the constriction brought on by [employer].  She is staying busy however and enjoys working out with her daughter doing zoomba.  She has chronic lower back pain secondary to degenerative disc disease and notes that this is worse in the winter.  No swelling in her extremities at this time. She will notice mild swelling if she has too much sodium in her diet.  She will occasionally have palpitations but not often.  No fever, chills, n/v, cough, rash, dizziness, SOB, chest pain, abdominal pain or changes in bowel or bladder habits.  No episodes of bleeding. No bruising or petechiae.  She has maintained a good appetite and is staying well hydrated.   ECOG Performance Status: 1 - Symptomatic but completely ambulatory  Medications:  Allergies as of 11/25/2019      Reactions   Fish Oil Rash   Penicillins Rash      Medication List       Accurate as of November 25, 2019  4:26 PM. If you have any questions, ask your nurse or doctor.        ALPRAZolam 0.5 MG tablet Commonly known as: XANAX Take 1 tablet (0.5 mg total) by mouth 2 (two) times daily as needed for anxiety.   cyclobenzaprine 10 MG tablet Commonly known as: FLEXERIL Take 1 tablet (10 mg total) by mouth 3 (three) times daily as needed for muscle spasms.   divalproex 250 MG 24 hr tablet Commonly known as: DEPAKOTE ER Take 3 tablets (750 mg total) by mouth at bedtime.   hydrochlorothiazide 25 MG tablet Commonly known as: HYDRODIURIL Take 1 tablet (25 mg total) by mouth daily.   Jinteli 1-5 MG-MCG Tabs tablet Generic drug: norethindrone-ethinyl  estradiol Take 1 tablet by mouth daily.   NON FORMULARY [place]   potassium chloride SA 20 MEQ tablet Commonly known as: Klor-Con M20 Take 1 tablet (20 mEq total) by mouth daily.   pravastatin 20 MG tablet Commonly known as: PRAVACHOL TAKE 1 TABLET BY MOUTH EVERY DAY   sertraline 50 MG tablet Commonly known as: ZOLOFT Take 1 tablet (50 mg total) by mouth at bedtime.   zolpidem 5 MG tablet Commonly known as: Ambien Take 1 tablet (5 mg total) by mouth at bedtime as needed for sleep.       Allergies:  Allergies  Allergen Reactions  . Fish Oil Rash  . Penicillins Rash    Past Medical History, Surgical history, Social history, and Family History were reviewed and updated.  Review of Systems: All other 10 point review of systems is negative.   Physical Exam:  height is 5' 2.5" (1.588 m) and weight is 187 lb (84.8 kg). Her temporal temperature is 97.5 F (36.4 C) (abnormal). Her blood pressure is 143/89 (abnormal) and her pulse is 70. Her respiration is 18 and oxygen saturation is 100%.   Wt Readings from Last 3 Encounters:  11/25/19 187 lb (84.8 kg)  07/14/18 179 lb (81.2 kg)  02/14/18 182 lb (82.6 kg)    Ocular: Sclerae unicteric, pupils equal, round and reactive to light Ear-nose-throat: Oropharynx clear, dentition fair Lymphatic: No cervical  or supraclavicular adenopathy Lungs no rales or rhonchi, good excursion bilaterally Heart regular rate and rhythm, no murmur appreciated Abd soft, nontender, positive bowel sounds, no liver or spleen tip palpated on exam, no fluid wave  MSK no focal spinal tenderness, no joint edema Neuro: non-focal, well-oriented, appropriate affect Breasts: Deferred   Lab Results  Component Value Date   WBC 11.6 (H) 11/25/2019   HGB 13.1 11/25/2019   HCT 40.0 11/25/2019   MCV 91.7 11/25/2019   PLT 206 11/25/2019   No results found for: FERRITIN, IRON, TIBC, UIBC, IRONPCTSAT Lab Results  Component Value Date   RBC 4.36 11/25/2019    No results found for: KPAFRELGTCHN, LAMBDASER, KAPLAMBRATIO No results found for: IGGSERUM, IGA, IGMSERUM No results found for: Odetta Pink, SPEI   Chemistry      Component Value Date/Time   NA 141 11/25/2019 1456   K 3.9 11/25/2019 1456   CL 104 11/25/2019 1456   CO2 30 11/25/2019 1456   BUN 13 11/25/2019 1456   CREATININE 0.67 11/25/2019 1456   CREATININE 0.60 11/13/2019 1505      Component Value Date/Time   CALCIUM 9.5 11/25/2019 1456   ALKPHOS 60 11/25/2019 1456   AST 15 11/25/2019 1456   ALT 10 11/25/2019 1456   BILITOT 0.4 11/25/2019 1456       Impression and Plan: Linda Kerr is a very pleasant 59 yo African American female with mild leukocytosis.  WBC count today is 11.1, lymphocytes 58%.  Flow cytometry was sent today. We will see what all her lab work reveals and then determine when she needs to follow-up.  She will contact our office with any questions or concerns.   Laverna Peace, NP 1/27/20214:26 PM

## 2019-11-26 ENCOUNTER — Telehealth: Payer: Self-pay | Admitting: Family

## 2019-11-26 LAB — LACTATE DEHYDROGENASE: LDH: 196 U/L — ABNORMAL HIGH (ref 98–192)

## 2019-11-26 LAB — SURGICAL PATHOLOGY

## 2019-11-26 NOTE — Telephone Encounter (Signed)
Will determine once we have her Flow cytometry result. Per 1/27 los

## 2019-11-27 ENCOUNTER — Telehealth: Payer: Self-pay | Admitting: Family

## 2019-11-27 NOTE — Telephone Encounter (Signed)
Left voicemail on her personal phone with call back number to go over Flow cytometry reprt.

## 2019-11-30 ENCOUNTER — Telehealth: Payer: Self-pay | Admitting: Family

## 2019-11-30 LAB — FLOW CYTOMETRY

## 2019-11-30 NOTE — Telephone Encounter (Signed)
No answer. Left message with call back number.

## 2019-12-03 ENCOUNTER — Telehealth: Payer: Self-pay | Admitting: Family

## 2019-12-03 NOTE — Telephone Encounter (Signed)
I left a message on patient's personal voicemail as we have been playing phone tag for several days. I let her know the flow cytometry was negative and that she was welcome to call back with any further questions. I did leave our call back number.

## 2020-01-08 ENCOUNTER — Other Ambulatory Visit: Payer: Self-pay | Admitting: Physician Assistant

## 2020-01-16 ENCOUNTER — Other Ambulatory Visit: Payer: Self-pay | Admitting: Physician Assistant

## 2020-05-16 ENCOUNTER — Other Ambulatory Visit: Payer: Self-pay | Admitting: Physician Assistant

## 2020-05-16 DIAGNOSIS — I10 Essential (primary) hypertension: Secondary | ICD-10-CM

## 2020-05-17 ENCOUNTER — Other Ambulatory Visit: Payer: Self-pay | Admitting: Physician Assistant

## 2020-06-11 ENCOUNTER — Other Ambulatory Visit: Payer: Self-pay | Admitting: Physician Assistant

## 2020-06-11 DIAGNOSIS — I10 Essential (primary) hypertension: Secondary | ICD-10-CM

## 2020-07-31 ENCOUNTER — Other Ambulatory Visit: Payer: Self-pay | Admitting: Physician Assistant

## 2020-07-31 DIAGNOSIS — I10 Essential (primary) hypertension: Secondary | ICD-10-CM

## 2020-08-08 ENCOUNTER — Encounter: Payer: Self-pay | Admitting: Psychiatry

## 2020-08-08 ENCOUNTER — Ambulatory Visit (INDEPENDENT_AMBULATORY_CARE_PROVIDER_SITE_OTHER): Payer: BC Managed Care – PPO | Admitting: Psychiatry

## 2020-08-08 ENCOUNTER — Other Ambulatory Visit: Payer: Self-pay

## 2020-08-08 DIAGNOSIS — F5105 Insomnia due to other mental disorder: Secondary | ICD-10-CM

## 2020-08-08 DIAGNOSIS — F411 Generalized anxiety disorder: Secondary | ICD-10-CM

## 2020-08-08 DIAGNOSIS — F3181 Bipolar II disorder: Secondary | ICD-10-CM | POA: Diagnosis not present

## 2020-08-08 MED ORDER — SERTRALINE HCL 50 MG PO TABS: 50.0000 mg | ORAL_TABLET | Freq: Every day | ORAL | 3 refills | Status: DC

## 2020-08-08 MED ORDER — ZOLPIDEM TARTRATE 5 MG PO TABS: 5.0000 mg | ORAL_TABLET | Freq: Every evening | ORAL | 1 refills | Status: DC | PRN

## 2020-08-08 MED ORDER — DIVALPROEX SODIUM ER 250 MG PO TB24: 750.0000 mg | ORAL_TABLET | Freq: Every day | ORAL | 3 refills | Status: DC

## 2020-08-08 NOTE — Progress Notes (Signed)
Linda Kerr [id number] Mar 10, 1961 59 y.o.  Subjective:   Patient ID:  Linda Kerr is a 59 y.o. (DOB 1961-02-12) female.  Chief Complaint:  Chief Complaint  Patient presents with  . Follow-up    bipolar disorder    HPI Linda Kerr presents to the office today for follow-up of bipolar disorder and generalized anxiety disorder.  Last seen 07/2019.  No meds were changed.  She has been maintained on Depakote ER 750 mg nightly with sertraline 50 mg daily for seen in our practice in 2002-01-25. B died 57 yo in May 27, 2020.   Otherwise doing well.  Still pleased with meds.  Exercising.   Ambien prn.  No comments from family. Patient reports stable mood and denies depressed or irritable moods.  Patient reports recent difficulty with anxiety with worry.  In proportion to situation.  Able to function.  Patient denies difficulty with sleep initiation or maintenance. Average 5-6 hours but naps in daytime.  Denies appetite disturbance.  Patient reports that energy and motivation have been good.  Patient denies any difficulty with concentration.  Patient denies any suicidal ideation.  Past Psychiatric Medication Trials: Alprazolam, Ambien 5 mg awakening, clonazepam, sertraline 75, Depakote ER 750, lithium, citalopram, nefazodone, Wellbutrin agitated  Review of Systems:  Review of Systems  Gastrointestinal: Negative for nausea.  Neurological: Negative for tremors and weakness.    Medications: I have reviewed the patient's current medications.  Current Outpatient Medications  Medication Sig Dispense Refill  . ALPRAZolam (XANAX) 0.5 MG tablet Take 1 tablet (0.5 mg total) by mouth 2 (two) times daily as needed for anxiety. 30 tablet 1  . cyclobenzaprine (FLEXERIL) 10 MG tablet Take 1 tablet (10 mg total) by mouth 3 (three) times daily as needed for muscle spasms. 30 tablet 0  . divalproex (DEPAKOTE ER) 250 MG 24 hr tablet Take 3 tablets (750 mg total) by mouth at bedtime. 270 tablet 3  .  hydrochlorothiazide (HYDRODIURIL) 25 MG tablet Take 1 tablet (25 mg total) by mouth daily. PLEASE SCHEDULE FOLLOW UP WITH CODY MARTIN, PA FOR FURTHER REFILLS [phone] 30 tablet 0  . montelukast (SINGULAIR) 10 MG tablet TAKE 1 TABLET BY MOUTH EVERYDAY AT BEDTIME 90 tablet 1  . NON FORMULARY [place]    . norethindrone-ethinyl estradiol (JINTELI) 1-5 MG-MCG TABS Take 1 tablet by mouth daily.    . potassium chloride SA (KLOR-CON M20) 20 MEQ tablet Take 1 tablet (20 mEq total) by mouth daily. PLEASE SCHEDULE FOLLOW UP WITH CODY MARTIN, PA FOR FURTHER REFILLS [phone] 30 tablet 0  . pravastatin (PRAVACHOL) 20 MG tablet Take 1 tablet (20 mg total) by mouth daily. PLEASE SCHEDULE FOLLOW UP WITH CODY MARTIN, PA FOR FURTHER REFILLS [phone] 30 tablet 0  . sertraline (ZOLOFT) 50 MG tablet Take 1 tablet (50 mg total) by mouth at bedtime. 90 tablet 3  . zolpidem (AMBIEN) 5 MG tablet Take 1 tablet (5 mg total) by mouth at bedtime as needed for sleep. 30 tablet 1   No current facility-administered medications for this visit.    Medication Side Effects: None  Allergies:  Allergies  Allergen Reactions  . Fish Oil Rash  . Penicillins Rash    Past Medical History:  Diagnosis Date  . Allergy   . Anemia   . Anxiety   . Arthritis   . Bipolar disorder ([place])   . Depression   . Hyperlipidemia   . Sleep apnea 07/29/2017    Family History  Problem Relation Age of  Onset  . Hypertension Unknown   . Coronary artery disease Unknown   . Colon cancer Unknown   . Diabetes Unknown   . Heart disease Mother   . Hypertension Mother   . Diabetes Father   . Hypertension Father     Social History   Socioeconomic History  . Marital status: Married    Spouse name: Not on file  . Number of children: 1  . Years of education: Not on file  . Highest education level: Not on file  Occupational History  . Occupation: SCHOOL Nurse, adult: [employer]  Tobacco Use  . Smoking status:  Former Smoker    Quit date: 10/29/1988    Years since quitting: 31.7  . Smokeless tobacco: Never Used  Vaping Use  . Vaping Use: Never used  Substance and Sexual Activity  . Alcohol use: Yes    Alcohol/week: 0.0 standard drinks    Comment: rare  . Drug use: No  . Sexual activity: Yes  Other Topics Concern  . Not on file  Social History Narrative  . Not on file   Social Determinants of Health   Financial Resource Strain:   . Difficulty of Paying Living Expenses: Not on file  Food Insecurity:   . Worried About Charity fundraiser in the Last Year: Not on file  . Ran Out of Food in the Last Year: Not on file  Transportation Needs:   . Lack of Transportation (Medical): Not on file  . Lack of Transportation (Non-Medical): Not on file  Physical Activity:   . Days of Exercise per Week: Not on file  . Minutes of Exercise per Session: Not on file  Stress:   . Feeling of Stress : Not on file  Social Connections:   . Frequency of Communication with Friends and Family: Not on file  . Frequency of Social Gatherings with Friends and Family: Not on file  . Attends Religious Services: Not on file  . Active Member of Clubs or Organizations: Not on file  . Attends Archivist Meetings: Not on file  . Marital Status: Not on file  Intimate Partner Violence:   . Fear of Current or Ex-Partner: Not on file  . Emotionally Abused: Not on file  . Physically Abused: Not on file  . Sexually Abused: Not on file    Past Medical History, Surgical history, Social history, and Family history were reviewed and updated as appropriate.   Please see review of systems for further details on the patient's review from today.   Objective:   Physical Exam:  There were no vitals taken for this visit.  Physical Exam Constitutional:      General: She is not in acute distress.    Appearance: She is well-developed.  Musculoskeletal:        General: No deformity.  Neurological:     Mental  Status: She is alert and oriented to person, place, and time.     Coordination: Coordination normal.  Psychiatric:        Attention and Perception: Attention and perception normal. She does not perceive auditory or visual hallucinations.        Mood and Affect: Mood is not anxious or depressed. Affect is not labile, blunt, angry or inappropriate.        Speech: Speech normal.        Behavior: Behavior normal.        Thought Content: Thought content normal. Thought content does  not include homicidal or suicidal ideation. Thought content does not include homicidal or suicidal plan.        Cognition and Memory: Cognition and memory normal.        Judgment: Judgment normal.     Comments: Insight intact. No delusions.       Lab Review:     Component Value Date/Time   NA 141 11/25/2019 1456   K 3.9 11/25/2019 1456   CL 104 11/25/2019 1456   CO2 30 11/25/2019 1456   GLUCOSE 82 11/25/2019 1456   BUN 13 11/25/2019 1456   CREATININE 0.67 11/25/2019 1456   CREATININE 0.60 11/13/2019 1505   CALCIUM 9.5 11/25/2019 1456   PROT 7.3 11/25/2019 1456   ALBUMIN 4.2 11/25/2019 1456   AST 15 11/25/2019 1456   ALT 10 11/25/2019 1456   ALKPHOS 60 11/25/2019 1456   BILITOT 0.4 11/25/2019 1456   GFRNONAA >60 11/25/2019 1456   GFRAA >60 11/25/2019 1456       Component Value Date/Time   WBC 11.6 (H) 11/25/2019 1456   WBC 11.5 (H) 11/13/2019 1505   RBC 4.36 11/25/2019 1456   HGB 13.1 11/25/2019 1456   HCT 40.0 11/25/2019 1456   PLT 206 11/25/2019 1456   MCV 91.7 11/25/2019 1456   MCH 30.0 11/25/2019 1456   MCHC 32.8 11/25/2019 1456   RDW 12.5 11/25/2019 1456   LYMPHSABS 6.7 (H) 11/25/2019 1456   MONOABS 0.8 11/25/2019 1456   EOSABS 0.1 11/25/2019 1456   BASOSABS 0.1 11/25/2019 1456    No results found for: POCLITH, LITHIUM   Lab Results  Component Value Date   VALPROATE 58.2 12/06/2016     .res Assessment: Plan:    Linda Kerr was seen today for follow-up.  Diagnoses and all orders  for this visit:  Generalized anxiety disorder -     sertraline (ZOLOFT) 50 MG tablet; Take 1 tablet (50 mg total) by mouth at bedtime.  Bipolar II disorder (HCC) -     divalproex (DEPAKOTE ER) 250 MG 24 hr tablet; Take 3 tablets (750 mg total) by mouth at bedtime.  Insomnia due to mental condition -     zolpidem (AMBIEN) 5 MG tablet; Take 1 tablet (5 mg total) by mouth at bedtime as needed for sleep.   Disc bipolar and anxiety disorder and her fears of daughter 42 yo inheriting it.     Consider vitamin D bc limited sun exposure.   No problems with meds.  No med changes.  Continue Depakote ER 750 mg HS Sertraline 50 mg daily.  OK prn Ambien 5 mg.  Disc risk.  FU 1 year bc stable  Lynder Parents, MD, DFAPA     Please see After Visit Summary for patient specific instructions.  Future Appointments  Date Time Provider Brady  08/07/2021  3:30 PM Cottle, Billey Co., MD CP-[facility] None    No orders of the defined types were placed in this encounter.   -------------------------------

## 2020-08-26 ENCOUNTER — Other Ambulatory Visit: Payer: Self-pay | Admitting: Physician Assistant

## 2020-08-26 DIAGNOSIS — I10 Essential (primary) hypertension: Secondary | ICD-10-CM

## 2020-08-27 ENCOUNTER — Other Ambulatory Visit: Payer: Self-pay | Admitting: Physician Assistant

## 2020-08-27 DIAGNOSIS — I10 Essential (primary) hypertension: Secondary | ICD-10-CM

## 2020-09-05 ENCOUNTER — Other Ambulatory Visit: Payer: Self-pay | Admitting: Physician Assistant

## 2020-09-30 ENCOUNTER — Other Ambulatory Visit: Payer: Self-pay | Admitting: Physician Assistant

## 2020-09-30 DIAGNOSIS — I10 Essential (primary) hypertension: Secondary | ICD-10-CM

## 2020-10-30 ENCOUNTER — Other Ambulatory Visit: Payer: Self-pay | Admitting: Physician Assistant

## 2020-12-30 ENCOUNTER — Other Ambulatory Visit: Payer: Self-pay | Admitting: Physician Assistant

## 2021-01-17 ENCOUNTER — Other Ambulatory Visit: Payer: Self-pay | Admitting: Physician Assistant

## 2021-01-17 DIAGNOSIS — I10 Essential (primary) hypertension: Secondary | ICD-10-CM

## 2021-03-15 ENCOUNTER — Telehealth (INDEPENDENT_AMBULATORY_CARE_PROVIDER_SITE_OTHER): Payer: 59 | Admitting: Family Medicine

## 2021-03-15 ENCOUNTER — Encounter: Payer: Self-pay | Admitting: Family Medicine

## 2021-03-15 DIAGNOSIS — U071 COVID-19: Secondary | ICD-10-CM | POA: Diagnosis not present

## 2021-03-15 MED ORDER — NIRMATRELVIR/RITONAVIR (PAXLOVID)TABLET
3.0000 | ORAL_TABLET | Freq: Two times a day (BID) | ORAL | 0 refills | Status: AC
Start: 2021-03-15 — End: 2021-03-20

## 2021-03-15 NOTE — Progress Notes (Signed)
Virtual Visit via Video   I connected with patient on 03/15/21 at  2:30 PM EDT by a video enabled telemedicine application and verified that I am speaking with the correct person using two identifiers.  Location patient: Home Location provider: [facility] Persons participating in the virtual visit: Patient, Provider, Linda (Sabrina M)  I discussed the limitations of evaluation and management by telemedicine and the availability of in person appointments. The patient expressed understanding and agreed to proceed.  Subjective:   HPI:   COVID- pt tested + for COVID yesterday.  Sxs started Sunday w/ fatigue.  Then developed 'itchy throat, like allergies'.  Wilburn Mylar 'felt bad' and took home test.  No documented fevers but feels hot 'off and on'.  + sinus pressure.  + nasal congestion.  + nagging headache.  Intermittent cough.  No SOB.  No known sick contacts- husband and daughter both had COVID 'a couple of weeks ago'.  Pt is drinking water, gatorade.  Using echinacea tea and elderberry.    ROS:   See pertinent positives and negatives per HPI.  Patient Active Problem List   Diagnosis Date Noted  . Cervical cancer screening 01/14/2018  . Breast cancer screening 01/14/2018  . Need for Tdap vaccination 01/14/2018  . Essential hypertension 11/18/2017  . Sleep apnea 07/29/2017  . Borderline diabetes 07/29/2017  . Visit for preventive health examination 12/06/2016  . Thyromegaly 12/06/2016  . Hyperlipidemia 05/11/2015  . Bipolar affective disorder in remission ([place]) 05/11/2015  . BACK PAIN, CHRONIC 09/26/2007    Social History   Tobacco Use  . Smoking status: Former Smoker    Quit date: 10/29/1988    Years since quitting: 32.3  . Smokeless tobacco: Never Used  Substance Use Topics  . Alcohol use: Yes    Alcohol/week: 0.0 standard drinks    Comment: rare    Current Outpatient Medications:  .  ALPRAZolam (XANAX) 0.5 MG tablet, Take 1 tablet (0.5 mg total) by  mouth 2 (two) times daily as needed for anxiety., Disp: 30 tablet, Rfl: 1 .  cyclobenzaprine (FLEXERIL) 10 MG tablet, Take 1 tablet (10 mg total) by mouth 3 (three) times daily as needed for muscle spasms., Disp: 30 tablet, Rfl: 0 .  divalproex (DEPAKOTE ER) 250 MG 24 hr tablet, Take 3 tablets (750 mg total) by mouth at bedtime., Disp: 270 tablet, Rfl: 3 .  hydrochlorothiazide (HYDRODIURIL) 25 MG tablet, Take 1 tablet (25 mg total) by mouth daily. Due for physical in January, Disp: 30 tablet, Rfl: 1 .  montelukast (SINGULAIR) 10 MG tablet, TAKE 1 TABLET BY MOUTH EVERYDAY AT BEDTIME, Disp: 90 tablet, Rfl: 0 .  norethindrone-ethinyl estradiol (FEMHRT 1/5) 1-5 MG-MCG TABS tablet, Take 1 tablet by mouth daily., Disp: , Rfl:  .  potassium chloride SA (KLOR-CON M20) 20 MEQ tablet, Take 1 tablet (20 mEq total) by mouth daily. Due for physical in January, Disp: 30 tablet, Rfl: 1 .  pravastatin (PRAVACHOL) 20 MG tablet, Take 1 tablet (20 mg total) by mouth daily. Due for physical in January, Disp: 30 tablet, Rfl: 1 .  sertraline (ZOLOFT) 50 MG tablet, Take 1 tablet (50 mg total) by mouth at bedtime., Disp: 90 tablet, Rfl: 3 .  zolpidem (AMBIEN) 5 MG tablet, Take 1 tablet (5 mg total) by mouth at bedtime as needed for sleep., Disp: 30 tablet, Rfl: 1 .  NON FORMULARY, [place] (Patient not taking: Reported on 03/15/2021), Disp: , Rfl:   Allergies  Allergen Reactions  . Fish  Oil Rash  . Penicillins Rash    Objective:   There were no vitals taken for this visit.  AAOx3, NAD NCAT, EOMI No obvious CN deficits Coloring WNL Pt is able to speak clearly, coherently without shortness of breath or increased work of breathing.  Thought process is linear.  Mood is appropriate.   Assessment and Plan:   COVID- new.  Pt tested + yesterday after developing symptoms on Sunday.  Reviewed her current med list and we discussed starting Paxlovid for treatment.  She is not currently taking her statin and this is the  only medication that she would need to hold during tx.  Reviewed supportive care and red flags that should prompt return.  Pt expressed understanding and is in agreement w/ plan.   Annye Asa, MD 03/15/2021

## 2021-03-15 NOTE — Progress Notes (Signed)
I connected with  Linda Kerr on 03/15/21 by a video enabled telemedicine application and verified that I am speaking with the correct person using two identifiers.   I discussed the limitations of evaluation and management by telemedicine. The patient expressed understanding and agreed to proceed.

## 2021-03-21 ENCOUNTER — Other Ambulatory Visit: Payer: Self-pay | Admitting: Family

## 2021-04-03 ENCOUNTER — Ambulatory Visit (INDEPENDENT_AMBULATORY_CARE_PROVIDER_SITE_OTHER): Payer: BC Managed Care – PPO | Admitting: Registered Nurse

## 2021-04-03 ENCOUNTER — Encounter: Payer: Self-pay | Admitting: Registered Nurse

## 2021-04-03 ENCOUNTER — Other Ambulatory Visit: Payer: Self-pay

## 2021-04-03 VITALS — BP 160/70 | HR 70 | Temp 98.2°F | Resp 18 | Ht 62.5 in | Wt 184.6 lb

## 2021-04-03 DIAGNOSIS — R2241 Localized swelling, mass and lump, right lower limb: Secondary | ICD-10-CM

## 2021-04-03 DIAGNOSIS — I10 Essential (primary) hypertension: Secondary | ICD-10-CM

## 2021-04-03 DIAGNOSIS — D729 Disorder of white blood cells, unspecified: Secondary | ICD-10-CM

## 2021-04-03 DIAGNOSIS — R7303 Prediabetes: Secondary | ICD-10-CM

## 2021-04-03 DIAGNOSIS — E782 Mixed hyperlipidemia: Secondary | ICD-10-CM

## 2021-04-03 DIAGNOSIS — Z7689 Persons encountering health services in other specified circumstances: Secondary | ICD-10-CM | POA: Diagnosis not present

## 2021-04-03 DIAGNOSIS — J302 Other seasonal allergic rhinitis: Secondary | ICD-10-CM

## 2021-04-03 DIAGNOSIS — Z0000 Encounter for general adult medical examination without abnormal findings: Secondary | ICD-10-CM | POA: Diagnosis not present

## 2021-04-03 MED ORDER — POTASSIUM CHLORIDE CRYS ER 20 MEQ PO TBCR: 20.0000 meq | EXTENDED_RELEASE_TABLET | Freq: Every day | ORAL | 1 refills | Status: DC

## 2021-04-03 MED ORDER — MONTELUKAST SODIUM 10 MG PO TABS: 10.0000 mg | ORAL_TABLET | Freq: Every day | ORAL | 3 refills | Status: DC

## 2021-04-03 MED ORDER — HYDROCHLOROTHIAZIDE 25 MG PO TABS: 25.0000 mg | ORAL_TABLET | Freq: Every day | ORAL | 1 refills | Status: DC

## 2021-04-03 MED ORDER — PRAVASTATIN SODIUM 20 MG PO TABS: 20.0000 mg | ORAL_TABLET | Freq: Every day | ORAL | 1 refills | Status: DC

## 2021-04-03 NOTE — Patient Instructions (Addendum)
Ms. Cherolyn Behrle to meet you  Referral for podiatry is placed. They should call you soon.  Keep up the great work with healthy lifestyle. It's the best way to overcome family histories of heart disease.   Labs today should be back tomorrow. I'll let you know if there's anything concerning  Let's plan on a 6 mo follow up, sooner if anything else arises.  Thank you  Rich     If you have lab work done today you will be contacted with your lab results within the next 2 weeks.  If you have not heard from [place] then please contact us. The fastest way to get your results is to register for My Chart.   IF you received an x-ray today, you will receive an invoice from [facility]. Please contact [facility] at [phone] with questions or concerns regarding your invoice.   IF you received labwork today, you will receive an invoice from [place]. Please contact [facility] at [phone] with questions or concerns regarding your invoice.   Our billing staff will not be able to assist you with questions regarding bills from these companies.  You will be contacted with the lab results as soon as they are available. The fastest way to get your results is to activate your My Chart account. Instructions are located on the last page of this paperwork. If you have not heard from [place] regarding the results in 2 weeks, please contact this office.

## 2021-04-03 NOTE — Progress Notes (Signed)
Established Patient Office Visit  Subjective:  Patient ID: Linda Kerr, female    DOB: [birth year]  Age: 60 y.o. MRN: [id number]  CC:  Chief Complaint  Patient presents with  . Transitions Of Care    Patient states she is here for a TOC and an CPE. Patient also needs medication refills at this time     HPI Linda Kerr presents for CPE and TOC  Histories reviewed and updated with patient.   Concern for mass on foot: Slowly appeared posterior L foot softish mass No change in color No drainage No hx of injury Does not appear as wart  Weight: Has been pursuing diet and exercise Feels like fat is coming off Weight is not changing Concern for further intervention  Past Medical History:  Diagnosis Date  . Allergy   . Anemia   . Anxiety   . Arthritis   . Bipolar disorder ([place])   . Depression   . Hyperlipidemia   . Sleep apnea 07/29/2017    Past Surgical History:  Procedure Laterality Date  . CESAREAN SECTION    . SIGMOIDOSCOPY      Family History  Problem Relation Age of Onset  . Hypertension Other   . Coronary artery disease Other   . Colon cancer Other   . Diabetes Other   . Heart disease Mother   . Hypertension Mother   . Diabetes Father   . Hypertension Father     Social History   Socioeconomic History  . Marital status: Married    Spouse name: Not on file  . Number of children: 1  . Years of education: Not on file  . Highest education level: Not on file  Occupational History  . Occupation: SCHOOL Nurse, adult: [employer]  Tobacco Use  . Smoking status: Former Smoker    Quit date: 10/29/1988    Years since quitting: 32.4  . Smokeless tobacco: Never Used  Vaping Use  . Vaping Use: Never used  Substance and Sexual Activity  . Alcohol use: Yes    Alcohol/week: 0.0 standard drinks    Comment: rare  . Drug use: No  . Sexual activity: Yes  Other Topics Concern  . Not on file  Social History Narrative  . Not on file    Social Determinants of Health   Financial Resource Strain: Not on file  Food Insecurity: Not on file  Transportation Needs: Not on file  Physical Activity: Not on file  Stress: Not on file  Social Connections: Not on file  Intimate Partner Violence: Not on file    Outpatient Medications Prior to Visit  Medication Sig Dispense Refill  . ALPRAZolam (XANAX) 0.5 MG tablet Take 1 tablet (0.5 mg total) by mouth 2 (two) times daily as needed for anxiety. 30 tablet 1  . cyclobenzaprine (FLEXERIL) 10 MG tablet Take 1 tablet (10 mg total) by mouth 3 (three) times daily as needed for muscle spasms. 30 tablet 0  . divalproex (DEPAKOTE ER) 250 MG 24 hr tablet Take 3 tablets (750 mg total) by mouth at bedtime. 270 tablet 3  . NON FORMULARY [place]    . norethindrone-ethinyl estradiol (FEMHRT 1/5) 1-5 MG-MCG TABS tablet Take 1 tablet by mouth daily.    . sertraline (ZOLOFT) 50 MG tablet Take 1 tablet (50 mg total) by mouth at bedtime. 90 tablet 3  . zolpidem (AMBIEN) 5 MG tablet Take 1 tablet (5 mg total) by mouth at bedtime as  needed for sleep. 30 tablet 1  . hydrochlorothiazide (HYDRODIURIL) 25 MG tablet Take 1 tablet (25 mg total) by mouth daily. Due for physical in January 30 tablet 1  . montelukast (SINGULAIR) 10 MG tablet TAKE 1 TABLET BY MOUTH EVERYDAY AT BEDTIME 90 tablet 0  . potassium chloride SA (KLOR-CON M20) 20 MEQ tablet Take 1 tablet (20 mEq total) by mouth daily. Due for physical in January 30 tablet 1  . pravastatin (PRAVACHOL) 20 MG tablet Take 1 tablet (20 mg total) by mouth daily. Due for physical in January 30 tablet 1   No facility-administered medications prior to visit.    Allergies  Allergen Reactions  . Fish Oil Rash  . Penicillins Rash    ROS Review of Systems  Constitutional: Negative.   HENT: Negative.   Eyes: Negative.   Respiratory: Negative.   Cardiovascular: Negative.   Gastrointestinal: Negative.   Genitourinary: Negative.   Musculoskeletal:  Negative.   Skin: Negative.   Neurological: Negative.   Psychiatric/Behavioral: Negative.   All other systems reviewed and are negative.     Objective:    Physical Exam Vitals and nursing note reviewed.  Constitutional:      General: She is not in acute distress.    Appearance: Normal appearance. She is normal weight. She is not ill-appearing, toxic-appearing or diaphoretic.  HENT:     Head: Normocephalic and atraumatic.     Right Ear: Tympanic membrane, ear canal and external ear normal. There is no impacted cerumen.     Left Ear: Tympanic membrane, ear canal and external ear normal. There is no impacted cerumen.     Nose: Nose normal. No congestion or rhinorrhea.     Mouth/Throat:     Mouth: Mucous membranes are moist.     Pharynx: Oropharynx is clear. No oropharyngeal exudate or posterior oropharyngeal erythema.  Eyes:     General: No scleral icterus.       Right eye: No discharge.        Left eye: No discharge.     Extraocular Movements: Extraocular movements intact.     Conjunctiva/sclera: Conjunctivae normal.     Pupils: Pupils are equal, round, and reactive to light.  Cardiovascular:     Rate and Rhythm: Normal rate and regular rhythm.     Pulses: Normal pulses.     Heart sounds: Normal heart sounds. No murmur heard. No friction rub. No gallop.   Pulmonary:     Effort: Pulmonary effort is normal. No respiratory distress.     Breath sounds: Normal breath sounds. No stridor. No wheezing, rhonchi or rales.  Chest:     Chest wall: No tenderness.  Abdominal:     General: Abdomen is flat. Bowel sounds are normal. There is no distension.     Palpations: Abdomen is soft. There is no mass.     Tenderness: There is no abdominal tenderness. There is no right CVA tenderness, left CVA tenderness, guarding or rebound.     Hernia: No hernia is present.  Musculoskeletal:        General: No swelling, tenderness, deformity or signs of injury. Normal range of motion.     Right  lower leg: No edema.     Left lower leg: No edema.  Skin:    General: Skin is warm and dry.     Capillary Refill: Capillary refill takes less than 2 seconds.     Coloration: Skin is not jaundiced or pale.     Findings: Lesion (posterior  medial R foot, soft skin tone mass about 2cm in diameter) present. No bruising, erythema or rash.  Neurological:     General: No focal deficit present.     Mental Status: She is alert and oriented to person, place, and time. Mental status is at baseline.     Cranial Nerves: No cranial nerve deficit.     Sensory: No sensory deficit.     Motor: No weakness.     Coordination: Coordination normal.     Gait: Gait normal.     Deep Tendon Reflexes: Reflexes normal.  Psychiatric:        Mood and Affect: Mood normal.        Behavior: Behavior normal.        Thought Content: Thought content normal.        Judgment: Judgment normal.     BP (!) 160/70   Pulse 70   Temp 98.2 F (36.8 C) (Temporal)   Resp 18   Ht 5' 2.5" (1.588 m)   Wt 184 lb 9.6 oz (83.7 kg)   SpO2 96%   BMI 33.23 kg/m  Wt Readings from Last 3 Encounters:  04/03/21 184 lb 9.6 oz (83.7 kg)  11/25/19 187 lb (84.8 kg)  07/14/18 179 lb (81.2 kg)     There are no preventive care reminders to display for this patient.  There are no preventive care reminders to display for this patient.  Lab Results  Component Value Date   TSH 1.98 12/06/2016   Lab Results  Component Value Date   WBC 11.6 (H) 11/25/2019   HGB 13.1 11/25/2019   HCT 40.0 11/25/2019   MCV 91.7 11/25/2019   PLT 206 11/25/2019   Lab Results  Component Value Date   NA 141 11/25/2019   K 3.9 11/25/2019   CO2 30 11/25/2019   GLUCOSE 82 11/25/2019   BUN 13 11/25/2019   CREATININE 0.67 11/25/2019   BILITOT 0.4 11/25/2019   ALKPHOS 60 11/25/2019   AST 15 11/25/2019   ALT 10 11/25/2019   PROT 7.3 11/25/2019   ALBUMIN 4.2 11/25/2019   CALCIUM 9.5 11/25/2019   ANIONGAP 7 11/25/2019   GFR 133.41 07/14/2018    Lab Results  Component Value Date   CHOL 177 11/13/2019   Lab Results  Component Value Date   HDL 41 (L) 11/13/2019   Lab Results  Component Value Date   LDLCALC 119 (H) 11/13/2019   Lab Results  Component Value Date   TRIG 71 11/13/2019   Lab Results  Component Value Date   CHOLHDL 4.3 11/13/2019   Lab Results  Component Value Date   HGBA1C 5.9 (H) 11/13/2019      Assessment & Plan:   Problem List Items Addressed This Visit      Cardiovascular and Mediastinum   Essential hypertension   Relevant Medications   pravastatin (PRAVACHOL) 20 MG tablet   hydrochlorothiazide (HYDRODIURIL) 25 MG tablet   potassium chloride SA (KLOR-CON M20) 20 MEQ tablet     Other   Hyperlipidemia   Relevant Medications   pravastatin (PRAVACHOL) 20 MG tablet   hydrochlorothiazide (HYDRODIURIL) 25 MG tablet   Other Relevant Orders   TSH   Comprehensive metabolic panel   Lipid panel   Borderline diabetes   Relevant Orders   Hemoglobin A1c    Other Visit Diagnoses    Abnormal WBC count    -  Primary   Relevant Orders   CBC with Differential/Platelet   TSH   Comprehensive  metabolic panel   Seasonal allergies       Relevant Medications   montelukast (SINGULAIR) 10 MG tablet      Meds ordered this encounter  Medications  . montelukast (SINGULAIR) 10 MG tablet    Sig: Take 1 tablet (10 mg total) by mouth at bedtime.    Dispense:  90 tablet    Refill:  3    Order Specific Question:   Supervising Provider    Answer:   Carlota Raspberry, JEFFREY R [[id number]]  . pravastatin (PRAVACHOL) 20 MG tablet    Sig: Take 1 tablet (20 mg total) by mouth daily. Due for physical in January    Dispense:  90 tablet    Refill:  1    Order Specific Question:   Supervising Provider    Answer:   Carlota Raspberry, JEFFREY R [[id number]]  . hydrochlorothiazide (HYDRODIURIL) 25 MG tablet    Sig: Take 1 tablet (25 mg total) by mouth daily. Due for physical in January    Dispense:  90 tablet    Refill:  1    Order Specific  Question:   Supervising Provider    Answer:   Carlota Raspberry, JEFFREY R [[id number]]  . potassium chloride SA (KLOR-CON M20) 20 MEQ tablet    Sig: Take 1 tablet (20 mEq total) by mouth daily. Due for physical in January    Dispense:  90 tablet    Refill:  1    Order Specific Question:   Supervising Provider    Answer:   Carlota Raspberry, JEFFREY R [[id number]]    Follow-up: Return in about 6 months (around 10/03/2021) for HTN, HLD, prediabetes.   PLAN  Unremarkable CPE save for foot lesion of uncertain but benign appearing etiology  Labs collected. Will follow up with the patient as warranted.  Encourage her to continue diet and exercise - steady weight likely reflective of change in body composition, weight loss likely to continue soon  Refer to podiatry. Unsure of etiology of foot concern. Wart vs cyst vs. Lipoma?  Patient encouraged to call clinic with any questions, comments, or concerns.  Maximiano Coss, NP

## 2021-04-04 LAB — HEMOGLOBIN A1C: Hgb A1c MFr Bld: 6.2 % (ref 4.6–6.5)

## 2021-04-04 LAB — COMPREHENSIVE METABOLIC PANEL
ALT: 12 U/L (ref 0–35)
AST: 19 U/L (ref 0–37)
Albumin: 4.2 g/dL (ref 3.5–5.2)
Alkaline Phosphatase: 67 U/L (ref 39–117)
BUN: 11 mg/dL (ref 6–23)
CO2: 24 mEq/L (ref 19–32)
Calcium: 9.6 mg/dL (ref 8.4–10.5)
Chloride: 101 mEq/L (ref 96–112)
Creatinine, Ser: 0.65 mg/dL (ref 0.40–1.20)
GFR: 97.42 mL/min (ref >60.00)
Glucose, Bld: 68 mg/dL — ABNORMAL LOW (ref 70–99)
Potassium: 3.7 mEq/L (ref 3.5–5.1)
Sodium: 141 mEq/L (ref 135–145)
Total Bilirubin: 0.4 mg/dL (ref 0.2–1.2)
Total Protein: 7.5 g/dL (ref 6.0–8.3)

## 2021-04-04 LAB — CBC WITH DIFFERENTIAL/PLATELET
Basophils Absolute: 0.1 10*3/uL (ref 0.0–0.1)
Basophils Relative: 0.9 % (ref 0.0–3.0)
Eosinophils Absolute: 0.1 10*3/uL (ref 0.0–0.7)
Eosinophils Relative: 1.2 % (ref 0.0–5.0)
HCT: 39.4 % (ref 36.0–46.0)
Hemoglobin: 13.1 g/dL (ref 12.0–15.0)
Lymphocytes Relative: 51.2 % — ABNORMAL HIGH (ref 12.0–46.0)
Lymphs Abs: 6.2 10*3/uL — ABNORMAL HIGH (ref 0.7–4.0)
MCHC: 33.3 g/dL (ref 30.0–36.0)
MCV: 90.7 fl (ref 78.0–100.0)
Monocytes Absolute: 0.9 10*3/uL (ref 0.1–1.0)
Monocytes Relative: 7.2 % (ref 3.0–12.0)
Neutro Abs: 4.8 10*3/uL (ref 1.4–7.7)
Neutrophils Relative %: 39.5 % — ABNORMAL LOW (ref 43.0–77.0)
Platelets: 228 10*3/uL (ref 150.0–400.0)
RBC: 4.34 Mil/uL (ref 3.87–5.11)
RDW: 13 % (ref 11.5–15.5)
WBC: 12.1 10*3/uL — ABNORMAL HIGH (ref 4.0–10.5)

## 2021-04-04 LAB — LIPID PANEL
Cholesterol: 224 mg/dL — ABNORMAL HIGH (ref 0–200)
HDL: 50.3 mg/dL (ref >39.00)
LDL Cholesterol: 152 mg/dL — ABNORMAL HIGH (ref 0–99)
NonHDL: 173.75
Total CHOL/HDL Ratio: 4
Triglycerides: 109 mg/dL (ref 0.0–149.0)
VLDL: 21.8 mg/dL (ref 0.0–40.0)

## 2021-04-04 LAB — TSH: TSH: 2.57 u[IU]/mL (ref 0.35–4.50)

## 2021-04-05 ENCOUNTER — Other Ambulatory Visit: Payer: Self-pay | Admitting: Registered Nurse

## 2021-04-05 DIAGNOSIS — E782 Mixed hyperlipidemia: Secondary | ICD-10-CM

## 2021-04-05 MED ORDER — PRAVASTATIN SODIUM 40 MG PO TABS: 40.0000 mg | ORAL_TABLET | Freq: Every day | ORAL | 1 refills | Status: DC

## 2021-04-12 ENCOUNTER — Encounter: Payer: Self-pay | Admitting: Podiatry

## 2021-04-12 ENCOUNTER — Ambulatory Visit (INDEPENDENT_AMBULATORY_CARE_PROVIDER_SITE_OTHER): Payer: BC Managed Care – PPO | Admitting: Podiatry

## 2021-04-12 ENCOUNTER — Other Ambulatory Visit: Payer: Self-pay

## 2021-04-12 ENCOUNTER — Ambulatory Visit (INDEPENDENT_AMBULATORY_CARE_PROVIDER_SITE_OTHER): Payer: BC Managed Care – PPO

## 2021-04-12 DIAGNOSIS — M205X1 Other deformities of toe(s) (acquired), right foot: Secondary | ICD-10-CM | POA: Diagnosis not present

## 2021-04-12 DIAGNOSIS — L723 Sebaceous cyst: Secondary | ICD-10-CM

## 2021-04-12 DIAGNOSIS — M2042 Other hammer toe(s) (acquired), left foot: Secondary | ICD-10-CM

## 2021-04-12 DIAGNOSIS — M2041 Other hammer toe(s) (acquired), right foot: Secondary | ICD-10-CM | POA: Diagnosis not present

## 2021-04-12 NOTE — Progress Notes (Signed)
Subjective:   Patient ID: Linda Kerr, female   DOB: 60 y.o.   MRN: [id number]   HPI Patient presents stating she is got this painful nodule on the back of her right heel and also appears to have some motion issues with the big toe joint right over left.  States that it gets occasionally sore and she was just concerned about it and states its been there for several years.  Patient does not smoke likes to be active   Review of Systems  All other systems reviewed and are negative.      Objective:  Physical Exam Vitals and nursing note reviewed.  Constitutional:      Appearance: She is well-developed.  Pulmonary:     Effort: Pulmonary effort is normal.  Musculoskeletal:        General: Normal range of motion.  Skin:    General: Skin is warm.  Neurological:     Mental Status: She is alert.    Neurovascular status intact muscle strength was found to be adequate range of motion adequate patient found to have a small nodule on the posterior aspect of the right heel that measures approximately 4 mm x 4 mm within the skin tissue.  It is loose in this area patient has good digital perfusion well oriented x3 and is found to have moderate loss of motion first MPJ right over left with no crepitus of the joint     Assessment:  Cyst formation right possible ganglionic versus any kind of bony structure with hallux limitus deformity right over left     Plan:  H&P x-ray reviewed discussed both conditions and do not recommend current treatment unless it grows in size.  I did explain draining it or possibly cutting it out but it would be difficult to do and at this point we will just watch it as it only hurts her periodically.  Do not recommend treatment for the big toe joint but ultimately may require surgical intervention and I educated on hallux limitus surgery  X-rays indicate there is spurring of the first metatarsal head right with no narrowing or indication of joint space loss

## 2021-05-18 LAB — HM DIABETES EYE EXAM

## 2021-08-07 ENCOUNTER — Other Ambulatory Visit: Payer: Self-pay

## 2021-08-07 ENCOUNTER — Encounter: Payer: Self-pay | Admitting: Psychiatry

## 2021-08-07 ENCOUNTER — Ambulatory Visit (INDEPENDENT_AMBULATORY_CARE_PROVIDER_SITE_OTHER): Payer: BC Managed Care – PPO | Admitting: Psychiatry

## 2021-08-07 DIAGNOSIS — F3181 Bipolar II disorder: Secondary | ICD-10-CM | POA: Diagnosis not present

## 2021-08-07 DIAGNOSIS — F5105 Insomnia due to other mental disorder: Secondary | ICD-10-CM | POA: Diagnosis not present

## 2021-08-07 DIAGNOSIS — F411 Generalized anxiety disorder: Secondary | ICD-10-CM

## 2021-08-07 MED ORDER — ZOLPIDEM TARTRATE 5 MG PO TABS: 5.0000 mg | ORAL_TABLET | Freq: Every evening | ORAL | 1 refills | Status: DC | PRN

## 2021-08-07 MED ORDER — ALPRAZOLAM 0.5 MG PO TABS: 0.5000 mg | ORAL_TABLET | Freq: Two times a day (BID) | ORAL | 1 refills | Status: DC | PRN

## 2021-08-07 MED ORDER — SERTRALINE HCL 50 MG PO TABS: 50.0000 mg | ORAL_TABLET | Freq: Every day | ORAL | 3 refills | Status: DC

## 2021-08-07 MED ORDER — DIVALPROEX SODIUM ER 250 MG PO TB24: 750.0000 mg | ORAL_TABLET | Freq: Every day | ORAL | 3 refills | Status: DC

## 2021-08-07 NOTE — Progress Notes (Signed)
Linda Kerr [id number] January 19, 1961 60 y.o.  Subjective:   Patient ID:  Linda Kerr is a 60 y.o. (DOB [birth year]) female.  Chief Complaint:  Chief Complaint  Patient presents with   Follow-up    HPI Linda Kerr presents to the office today for follow-up of bipolar disorder and generalized anxiety disorder.  seen 07/2019.  No meds were changed.  08/2020 appt doing well without med changed.  08/07/2021 appointment with the following noted: Sometimes Xanax for anxiety or sleep. Change hormone tablet. Consistent with psych meds Depakote 750, sertraline 50. Doinng well overall.  No persistent depressions. D moved back home. Settled in now.  Otherwise doing well.  Still pleased with meds.  Exercising.   Ambien prn. No SE. Needs to work on weight and working out.  No comments from family. Patient reports stable mood and denies depressed or irritable moods.  Patient reports recent difficulty with anxiety with worry.  In proportion to situation.  Able to function. 1 panic since here.  Patient denies difficulty with sleep initiation or maintenance. Average 5-6 hours but naps in daytime.  Night owl. Bothers H a little.  Denies appetite disturbance.  Patient reports that energy and motivation have been good.  Patient denies any difficulty with concentration.  Patient denies any suicidal ideation.  Past Psychiatric Medication Trials: Alprazolam, Ambien 5 mg awakening, clonazepam, sertraline 75, Depakote ER 750, lithium, citalopram, nefazodone, Wellbutrin agitated She has been maintained on Depakote ER 750 mg nightly with sertraline 50 mg daily for seen in our practice in 2003.  Review of Systems:  Review of Systems  Cardiovascular:  Negative for palpitations.       BP a little of a problem.  Gastrointestinal:  Negative for nausea.  Neurological:  Negative for tremors and weakness.   Medications: I have reviewed the patient's current medications.  Current Outpatient Medications   Medication Sig Dispense Refill   hydrochlorothiazide (HYDRODIURIL) 25 MG tablet Take 1 tablet (25 mg total) by mouth daily. Due for physical in January 90 tablet 1   NON FORMULARY [place]     norethindrone-ethinyl estradiol (FEMHRT 1/5) 1-5 MG-MCG TABS tablet Take 1 tablet by mouth daily.     potassium chloride SA (KLOR-CON M20) 20 MEQ tablet Take 1 tablet (20 mEq total) by mouth daily. Due for physical in January 90 tablet 1   pravastatin (PRAVACHOL) 40 MG tablet Take 1 tablet (40 mg total) by mouth daily. 90 tablet 1   ALPRAZolam (XANAX) 0.5 MG tablet Take 1 tablet (0.5 mg total) by mouth 2 (two) times daily as needed for anxiety. 30 tablet 1   cyclobenzaprine (FLEXERIL) 10 MG tablet Take 1 tablet (10 mg total) by mouth 3 (three) times daily as needed for muscle spasms. (Patient not taking: Reported on 08/07/2021) 30 tablet 0   divalproex (DEPAKOTE ER) 250 MG 24 hr tablet Take 3 tablets (750 mg total) by mouth at bedtime. 270 tablet 3   sertraline (ZOLOFT) 50 MG tablet Take 1 tablet (50 mg total) by mouth at bedtime. 90 tablet 3   zolpidem (AMBIEN) 5 MG tablet Take 1 tablet (5 mg total) by mouth at bedtime as needed for sleep. 30 tablet 1   No current facility-administered medications for this visit.    Medication Side Effects: None  Allergies:  Allergies  Allergen Reactions   Fish Oil Rash   Penicillins Rash    Past Medical History:  Diagnosis Date   Allergy    Anemia  Anxiety    Arthritis    Bipolar disorder ([place])    Depression    Hyperlipidemia    Sleep apnea 07/29/2017    Family History  Problem Relation Age of Onset   Hypertension Other    Coronary artery disease Other    Colon cancer Other    Diabetes Other    Heart disease Mother    Hypertension Mother    Diabetes Father    Hypertension Father     Social History   Socioeconomic History   Marital status: Married    Spouse name: Not on file   Number of children: 1   Years of education: Not on file    Highest education level: Not on file  Occupational History   Occupation: SCHOOL COUNSELOR    Employer: [employer]  Tobacco Use   Smoking status: Former    Types: Cigarettes    Quit date: 10/29/1988    Years since quitting: 32.7   Smokeless tobacco: Never  Vaping Use   Vaping Use: Never used  Substance and Sexual Activity   Alcohol use: Yes    Alcohol/week: 0.0 standard drinks    Comment: rare   Drug use: No   Sexual activity: Yes  Other Topics Concern   Not on file  Social History Narrative   Not on file   Social Determinants of Health   Financial Resource Strain: Not on file  Food Insecurity: Not on file  Transportation Needs: Not on file  Physical Activity: Not on file  Stress: Not on file  Social Connections: Not on file  Intimate Partner Violence: Not on file    Past Medical History, Surgical history, Social history, and Family history were reviewed and updated as appropriate.   Please see review of systems for further details on the patient's review from today.   Objective:   Physical Exam:  There were no vitals taken for this visit.  Physical Exam Constitutional:      General: She is not in acute distress.    Appearance: She is well-developed.  Musculoskeletal:        General: No deformity.  Neurological:     Mental Status: She is alert and oriented to person, place, and time.     Coordination: Coordination normal.  Psychiatric:        Attention and Perception: Attention and perception normal. She does not perceive auditory or visual hallucinations.        Mood and Affect: Mood is not anxious or depressed. Affect is not labile, blunt, angry or inappropriate.        Speech: Speech normal. Speech is not slurred.        Behavior: Behavior normal.        Thought Content: Thought content normal. Thought content does not include homicidal or suicidal ideation. Thought content does not include homicidal or suicidal plan.        Cognition and Memory:  Cognition and memory normal.        Judgment: Judgment normal.     Comments: Insight intact. No delusions.  Rare anxiety     Lab Review:     Component Value Date/Time   NA 141 04/03/2021 1626   K 3.7 04/03/2021 1626   CL 101 04/03/2021 1626   CO2 24 04/03/2021 1626   GLUCOSE 68 (L) 04/03/2021 1626   BUN 11 04/03/2021 1626   CREATININE 0.65 04/03/2021 1626   CREATININE 0.67 11/25/2019 1456   CREATININE 0.60 11/13/2019 1505  CALCIUM 9.6 04/03/2021 1626   PROT 7.5 04/03/2021 1626   ALBUMIN 4.2 04/03/2021 1626   AST 19 04/03/2021 1626   AST 15 11/25/2019 1456   ALT 12 04/03/2021 1626   ALT 10 11/25/2019 1456   ALKPHOS 67 04/03/2021 1626   BILITOT 0.4 04/03/2021 1626   BILITOT 0.4 11/25/2019 1456   GFRNONAA >60 11/25/2019 1456   GFRAA >60 11/25/2019 1456       Component Value Date/Time   WBC 12.1 (H) 04/03/2021 1626   RBC 4.34 04/03/2021 1626   HGB 13.1 04/03/2021 1626   HGB 13.1 11/25/2019 1456   HCT 39.4 04/03/2021 1626   PLT 228.0 04/03/2021 1626   PLT 206 11/25/2019 1456   MCV 90.7 04/03/2021 1626   MCH 30.0 11/25/2019 1456   MCHC 33.3 04/03/2021 1626   RDW 13.0 04/03/2021 1626   LYMPHSABS 6.2 (H) 04/03/2021 1626   MONOABS 0.9 04/03/2021 1626   EOSABS 0.1 04/03/2021 1626   BASOSABS 0.1 04/03/2021 1626    No results found for: POCLITH, LITHIUM   Lab Results  Component Value Date   VALPROATE 58.2 12/06/2016     .res Assessment: Plan:    Citlalli was seen today for follow-up.  Diagnoses and all orders for this visit:  Bipolar II disorder ([place]) -     divalproex (DEPAKOTE ER) 250 MG 24 hr tablet; Take 3 tablets (750 mg total) by mouth at bedtime.  Generalized anxiety disorder -     sertraline (ZOLOFT) 50 MG tablet; Take 1 tablet (50 mg total) by mouth at bedtime. -     ALPRAZolam (XANAX) 0.5 MG tablet; Take 1 tablet (0.5 mg total) by mouth 2 (two) times daily as needed for anxiety.  Insomnia due to mental condition -     zolpidem (AMBIEN) 5 MG tablet;  Take 1 tablet (5 mg total) by mouth at bedtime as needed for sleep.   Disc bipolar and anxiety disorder and her of daughter 41 yo getting psych help.  Stable with meds for extended time.   Consider vitamin D bc limited sun exposure.   Supportive therapy dealing with family. Disc with weight loss and Ozempic and Wegovy.  No problems with meds.  No med changes.  Continue Depakote ER 750 mg HS Sertraline 50 mg daily.  OK prn Ambien 5 mg.  Disc risk.  FU 1 year bc stable  Lynder Parents, MD, DFAPA     Please see After Visit Summary for patient specific instructions.  Future Appointments  Date Time Provider [place]  10/02/2021  3:10 PM Maximiano Coss, NP [id number]    No orders of the defined types were placed in this encounter.   -------------------------------

## 2021-08-07 NOTE — Patient Instructions (Addendum)
Consider Ozempic or Wegovy for weight loss [facility] if needed

## 2021-08-21 ENCOUNTER — Encounter: Payer: Self-pay | Admitting: Registered Nurse

## 2021-08-21 ENCOUNTER — Ambulatory Visit (INDEPENDENT_AMBULATORY_CARE_PROVIDER_SITE_OTHER): Payer: BC Managed Care – PPO | Admitting: Registered Nurse

## 2021-08-21 ENCOUNTER — Other Ambulatory Visit: Payer: Self-pay

## 2021-08-21 MED ORDER — SEMAGLUTIDE-WEIGHT MANAGEMENT 0.25 MG/0.5ML ~~LOC~~ SOAJ: 0.2500 mg | SUBCUTANEOUS | 0 refills | Status: DC

## 2021-08-21 NOTE — Progress Notes (Signed)
Established Patient Office Visit  Subjective:  Patient ID: Linda Kerr, female    DOB: 14-Oct-1961  Age: 60 y.o. MRN: [id number]  CC:  Chief Complaint  Patient presents with   Weight Loss    Patient states she wants to discuss weight loss.    HPI Linda Kerr presents for weight management  Interested in semaglutide Has heard good things  Exercises regularly with dance fitness Has worked to improve diet - notes soda as a weakness  Prediabetic in the past, no hx of t2dm Fam hx of t2dm  Past Medical History:  Diagnosis Date   Allergy    Anemia    Anxiety    Arthritis    Bipolar disorder ([place])    Depression    Hyperlipidemia    Sleep apnea 07/29/2017    Past Surgical History:  Procedure Laterality Date   CESAREAN SECTION     SIGMOIDOSCOPY      Family History  Problem Relation Age of Onset   Hypertension Other    Coronary artery disease Other    Colon cancer Other    Diabetes Other    Heart disease Mother    Hypertension Mother    Diabetes Father    Hypertension Father     Social History   Socioeconomic History   Marital status: Married    Spouse name: Not on file   Number of children: 1   Years of education: Not on file   Highest education level: Not on file  Occupational History   Occupation: SCHOOL COUNSELOR    Employer: [employer]  Tobacco Use   Smoking status: Former    Types: Cigarettes    Quit date: 10/29/1988    Years since quitting: 32.8   Smokeless tobacco: Never  Vaping Use   Vaping Use: Never used  Substance and Sexual Activity   Alcohol use: Yes    Alcohol/week: 0.0 standard drinks    Comment: rare   Drug use: No   Sexual activity: Yes  Other Topics Concern   Not on file  Social History Narrative   Not on file   Social Determinants of Health   Financial Resource Strain: Not on file  Food Insecurity: Not on file  Transportation Needs: Not on file  Physical Activity: Not on file  Stress: Not on file  Social  Connections: Not on file  Intimate Partner Violence: Not on file    Outpatient Medications Prior to Visit  Medication Sig Dispense Refill   ALPRAZolam (XANAX) 0.5 MG tablet Take 1 tablet (0.5 mg total) by mouth 2 (two) times daily as needed for anxiety. 30 tablet 1   divalproex (DEPAKOTE ER) 250 MG 24 hr tablet Take 3 tablets (750 mg total) by mouth at bedtime. 270 tablet 3   hydrochlorothiazide (HYDRODIURIL) 25 MG tablet Take 1 tablet (25 mg total) by mouth daily. Due for physical in January 90 tablet 1   NON FORMULARY [place]     norethindrone-ethinyl estradiol (FEMHRT 1/5) 1-5 MG-MCG TABS tablet Take 1 tablet by mouth daily.     potassium chloride SA (KLOR-CON M20) 20 MEQ tablet Take 1 tablet (20 mEq total) by mouth daily. Due for physical in January 90 tablet 1   pravastatin (PRAVACHOL) 40 MG tablet Take 1 tablet (40 mg total) by mouth daily. 90 tablet 1   sertraline (ZOLOFT) 50 MG tablet Take 1 tablet (50 mg total) by mouth at bedtime. 90 tablet 3   zolpidem (AMBIEN) 5 MG  tablet Take 1 tablet (5 mg total) by mouth at bedtime as needed for sleep. 30 tablet 1   cyclobenzaprine (FLEXERIL) 10 MG tablet Take 1 tablet (10 mg total) by mouth 3 (three) times daily as needed for muscle spasms. (Patient not taking: No sig reported) 30 tablet 0   No facility-administered medications prior to visit.    Allergies  Allergen Reactions   Fish Oil Rash   Penicillins Rash    ROS Review of Systems  Constitutional: Negative.   HENT: Negative.    Eyes: Negative.   Respiratory: Negative.    Cardiovascular: Negative.   Gastrointestinal: Negative.   Genitourinary: Negative.   Musculoskeletal: Negative.   Skin: Negative.   Neurological: Negative.   Psychiatric/Behavioral: Negative.    All other systems reviewed and are negative.    Objective:    Physical Exam Vitals and nursing note reviewed.  Constitutional:      General: She is not in acute distress.    Appearance: Normal appearance.  She is normal weight. She is not ill-appearing, toxic-appearing or diaphoretic.  Cardiovascular:     Rate and Rhythm: Normal rate and regular rhythm.     Heart sounds: Normal heart sounds. No murmur heard.   No friction rub. No gallop.  Pulmonary:     Effort: Pulmonary effort is normal. No respiratory distress.     Breath sounds: Normal breath sounds. No stridor. No wheezing, rhonchi or rales.  Chest:     Chest wall: No tenderness.  Skin:    General: Skin is warm and dry.  Neurological:     General: No focal deficit present.     Mental Status: She is alert and oriented to person, place, and time. Mental status is at baseline.  Psychiatric:        Mood and Affect: Mood normal.        Behavior: Behavior normal.        Thought Content: Thought content normal.        Judgment: Judgment normal.    BP 137/66   Pulse 77   Temp 98.4 F (36.9 C) (Temporal)   Resp 18   Ht 5' 2.5" (1.588 m)   Wt 185 lb 6.4 oz (84.1 kg)   SpO2 99%   BMI 33.37 kg/m  Wt Readings from Last 3 Encounters:  08/21/21 185 lb 6.4 oz (84.1 kg)  04/03/21 184 lb 9.6 oz (83.7 kg)  11/25/19 187 lb (84.8 kg)     Health Maintenance Due  Topic Date Due   Zoster Vaccines- Shingrix (1 of 2) Never done   COVID-19 Vaccine (2 - Pfizer risk series) 10/28/2020   INFLUENZA VACCINE  Never done    There are no preventive care reminders to display for this patient.  Lab Results  Component Value Date   TSH 2.57 04/03/2021   Lab Results  Component Value Date   WBC 12.1 (H) 04/03/2021   HGB 13.1 04/03/2021   HCT 39.4 04/03/2021   MCV 90.7 04/03/2021   PLT 228.0 04/03/2021   Lab Results  Component Value Date   NA 141 04/03/2021   K 3.7 04/03/2021   CO2 24 04/03/2021   GLUCOSE 68 (L) 04/03/2021   BUN 11 04/03/2021   CREATININE 0.65 04/03/2021   BILITOT 0.4 04/03/2021   ALKPHOS 67 04/03/2021   AST 19 04/03/2021   ALT 12 04/03/2021   PROT 7.5 04/03/2021   ALBUMIN 4.2 04/03/2021   CALCIUM 9.6 04/03/2021    ANIONGAP 7 11/25/2019  GFR 97.42 04/03/2021   Lab Results  Component Value Date   CHOL 224 (H) 04/03/2021   Lab Results  Component Value Date   HDL 50.30 04/03/2021   Lab Results  Component Value Date   LDLCALC 152 (H) 04/03/2021   Lab Results  Component Value Date   TRIG 109.0 04/03/2021   Lab Results  Component Value Date   CHOLHDL 4 04/03/2021   Lab Results  Component Value Date   HGBA1C 6.2 04/03/2021      Assessment & Plan:   Problem List Items Addressed This Visit   None Visit Diagnoses     Morbid obesity ([place])    -  Primary   Relevant Medications   Semaglutide-Weight Management 0.25 MG/0.5ML SOAJ       Meds ordered this encounter  Medications   Semaglutide-Weight Management 0.25 MG/0.5ML SOAJ    Sig: Inject 0.25 mg into the skin once a week.    Dispense:  6 mL    Refill:  0    Order Specific Question:   Supervising Provider    Answer:   Carlota Raspberry, JEFFREY R [[id number]]    Follow-up: Return in about 3 months (around 11/21/2021) for weight management.   PLAN In depth discussion of diet, exercise, and lifestyle management of weight.  Will seek coverage for semaglutide Return in 3 mo for med check  I spent 35 minutes with patient discussing medication, diet and exercise.  Maximiano Coss, NP

## 2021-08-21 NOTE — Patient Instructions (Addendum)
[name] to see you!  Let me know if you have any side effects or concerns.  Check out books by Merlinda Frederick - especially The Omnivore's Dilemma.  "Eat real food, not too much, mostly plants".  Try black bean tacos, tofu scramble, and vegan BLTs as filling meals that are healthier than some alternatives.  Limit soda! Mostly water.  Stay active. Check out Couch to [place] (great app) or Yoga With Adriene (YouTube) if you need to add something in.  See you in 3 mo  Thanks,  Rich     If you have lab work done today you will be contacted with your lab results within the next 2 weeks.  If you have not heard from [place] then please contact us. The fastest way to get your results is to register for My Chart.   IF you received an x-ray today, you will receive an invoice from [facility]. Please contact [facility] at [phone] with questions or concerns regarding your invoice.   IF you received labwork today, you will receive an invoice from [facility]. Please contact [facility] at [phone] with questions or concerns regarding your invoice.   Our billing staff will not be able to assist you with questions regarding bills from these companies.  You will be contacted with the lab results as soon as they are available. The fastest way to get your results is to activate your My Chart account. Instructions are located on the last page of this paperwork. If you have not heard from [place] regarding the results in 2 weeks, please contact this office.

## 2021-10-02 ENCOUNTER — Ambulatory Visit (INDEPENDENT_AMBULATORY_CARE_PROVIDER_SITE_OTHER): Payer: BC Managed Care – PPO | Admitting: Registered Nurse

## 2021-10-02 ENCOUNTER — Other Ambulatory Visit: Payer: Self-pay

## 2021-10-02 ENCOUNTER — Encounter: Payer: Self-pay | Admitting: Registered Nurse

## 2021-10-02 VITALS — BP 150/75 | HR 67 | Temp 98.2°F | Resp 18 | Ht 62.5 in | Wt 185.0 lb

## 2021-10-02 DIAGNOSIS — R7303 Prediabetes: Secondary | ICD-10-CM | POA: Diagnosis not present

## 2021-10-02 DIAGNOSIS — I10 Essential (primary) hypertension: Secondary | ICD-10-CM | POA: Diagnosis not present

## 2021-10-02 DIAGNOSIS — J302 Other seasonal allergic rhinitis: Secondary | ICD-10-CM | POA: Diagnosis not present

## 2021-10-02 DIAGNOSIS — E782 Mixed hyperlipidemia: Secondary | ICD-10-CM

## 2021-10-02 MED ORDER — SEMAGLUTIDE-WEIGHT MANAGEMENT 0.25 MG/0.5ML ~~LOC~~ SOAJ: 0.2500 mg | SUBCUTANEOUS | 1 refills | Status: DC

## 2021-10-02 MED ORDER — PRAVASTATIN SODIUM 80 MG PO TABS: 80.0000 mg | ORAL_TABLET | Freq: Every day | ORAL | 1 refills | Status: DC

## 2021-10-02 MED ORDER — BENAZEPRIL-HYDROCHLOROTHIAZIDE 20-25 MG PO TABS: 1.0000 | ORAL_TABLET | Freq: Every day | ORAL | 1 refills | Status: DC

## 2021-10-02 NOTE — Patient Instructions (Addendum)
[name] to see you!  Glad the semaglutide is going well.   Increase blood pressure meds as discussed I have sent increased cholesterol meds  See you in 3 mo  Rich     If you have lab work done today you will be contacted with your lab results within the next 2 weeks.  If you have not heard from [place] then please contact us. The fastest way to get your results is to register for My Chart.   IF you received an x-ray today, you will receive an invoice from [facility]. Please contact [facility] at [phone] with questions or concerns regarding your invoice.   IF you received labwork today, you will receive an invoice from [facility]. Please contact [facility] at [phone] with questions or concerns regarding your invoice.   Our billing staff will not be able to assist you with questions regarding bills from these companies.  You will be contacted with the lab results as soon as they are available. The fastest way to get your results is to activate your My Chart account. Instructions are located on the last page of this paperwork. If you have not heard from [place] regarding the results in 2 weeks, please contact this office.

## 2021-10-02 NOTE — Progress Notes (Signed)
Established Patient Office Visit  Subjective:  Patient ID: Linda Kerr, female    DOB: 11-Apr-1961  Age: 60 y.o. MRN: [id number]  CC:  Chief Complaint  Patient presents with   Follow-up    Patient states she is here for her 6 month follow up.    HPI Linda Kerr presents for 6 mo follow up  Hypertension: Patient Currently taking: hctz 25 mg po qd. Good effect. No AEs. Denies CV symptoms including: chest pain, shob, doe, headache, visual changes, fatigue, claudication, and dependent edema.   Previous readings and labs: BP Readings from Last 3 Encounters:  10/02/21 (!) 150/75  08/21/21 137/66  04/03/21 (!) 160/70   Lab Results  Component Value Date   CREATININE 0.65 04/03/2021    Hld Taking pravastatin 40mg  po qd limited effect, no AE Lab Results  Component Value Date   CHOL 224 (H) 04/03/2021   HDL 50.30 04/03/2021   LDLCALC 152 (H) 04/03/2021   LDLDIRECT 145.4 07/19/2010   TRIG 109.0 04/03/2021   CHOLHDL 4 04/03/2021    Obesity Has started on ozempic, doing well, noting changes in body image Occasional nausea with the "wrong foods".  Fatigue Worse this week, notes stressful week last week with a conference Unsure if depression vs. Metabolic concern.  She does follow with psychiatry Dr. Clovis Pu - feels good about this care.   Past Medical History:  Diagnosis Date   Allergy    Anemia    Anxiety    Arthritis    Bipolar disorder ([place])    Depression    Hyperlipidemia    Sleep apnea 07/29/2017    Past Surgical History:  Procedure Laterality Date   CESAREAN SECTION     SIGMOIDOSCOPY      Family History  Problem Relation Age of Onset   Hypertension Other    Coronary artery disease Other    Colon cancer Other    Diabetes Other    Heart disease Mother    Hypertension Mother    Diabetes Father    Hypertension Father     Social History   Socioeconomic History   Marital status: Married    Spouse name: Not on file   Number of children: 1    Years of education: Not on file   Highest education level: Not on file  Occupational History   Occupation: SCHOOL COUNSELOR    Employer: [employer]  Tobacco Use   Smoking status: Former    Types: Cigarettes    Quit date: 10/29/1988    Years since quitting: 32.9   Smokeless tobacco: Never  Vaping Use   Vaping Use: Never used  Substance and Sexual Activity   Alcohol use: Yes    Alcohol/week: 0.0 standard drinks    Comment: rare   Drug use: No   Sexual activity: Yes  Other Topics Concern   Not on file  Social History Narrative   Not on file   Social Determinants of Health   Financial Resource Strain: Not on file  Food Insecurity: Not on file  Transportation Needs: Not on file  Physical Activity: Not on file  Stress: Not on file  Social Connections: Not on file  Intimate Partner Violence: Not on file    Outpatient Medications Prior to Visit  Medication Sig Dispense Refill   ALPRAZolam (XANAX) 0.5 MG tablet Take 1 tablet (0.5 mg total) by mouth 2 (two) times daily as needed for anxiety. 30 tablet 1   divalproex (DEPAKOTE ER) 250 MG  24 hr tablet Take 3 tablets (750 mg total) by mouth at bedtime. 270 tablet 3   NON FORMULARY [place]     norethindrone-ethinyl estradiol (FEMHRT 1/5) 1-5 MG-MCG TABS tablet Take 1 tablet by mouth daily.     potassium chloride SA (KLOR-CON M20) 20 MEQ tablet Take 1 tablet (20 mEq total) by mouth daily. Due for physical in January 90 tablet 1   sertraline (ZOLOFT) 50 MG tablet Take 1 tablet (50 mg total) by mouth at bedtime. 90 tablet 3   zolpidem (AMBIEN) 5 MG tablet Take 1 tablet (5 mg total) by mouth at bedtime as needed for sleep. 30 tablet 1   hydrochlorothiazide (HYDRODIURIL) 25 MG tablet Take 1 tablet (25 mg total) by mouth daily. Due for physical in January 90 tablet 1   pravastatin (PRAVACHOL) 40 MG tablet Take 1 tablet (40 mg total) by mouth daily. 90 tablet 1   Semaglutide-Weight Management 0.25 MG/0.5ML SOAJ Inject 0.25 mg into  the skin once a week. 6 mL 0   cyclobenzaprine (FLEXERIL) 10 MG tablet Take 1 tablet (10 mg total) by mouth 3 (three) times daily as needed for muscle spasms. (Patient not taking: Reported on 08/07/2021) 30 tablet 0   No facility-administered medications prior to visit.    Allergies  Allergen Reactions   Fish Oil Rash   Penicillins Rash    ROS Review of Systems  Constitutional: Negative.   HENT: Negative.    Eyes: Negative.   Respiratory: Negative.    Cardiovascular: Negative.   Gastrointestinal: Negative.   Genitourinary: Negative.   Musculoskeletal: Negative.   Skin: Negative.   Neurological: Negative.   Psychiatric/Behavioral: Negative.    All other systems reviewed and are negative.    Objective:    Physical Exam Vitals and nursing note reviewed.  Constitutional:      General: She is not in acute distress.    Appearance: Normal appearance. She is normal weight. She is not ill-appearing, toxic-appearing or diaphoretic.  Cardiovascular:     Rate and Rhythm: Normal rate and regular rhythm.     Heart sounds: Normal heart sounds. No murmur heard.   No friction rub. No gallop.  Pulmonary:     Effort: Pulmonary effort is normal. No respiratory distress.     Breath sounds: Normal breath sounds. No stridor. No wheezing, rhonchi or rales.  Chest:     Chest wall: No tenderness.  Skin:    General: Skin is warm and dry.  Neurological:     General: No focal deficit present.     Mental Status: She is alert and oriented to person, place, and time. Mental status is at baseline.  Psychiatric:        Mood and Affect: Mood normal.        Behavior: Behavior normal.        Thought Content: Thought content normal.        Judgment: Judgment normal.    BP (!) 150/75   Pulse 67   Temp 98.2 F (36.8 C) (Temporal)   Resp 18   Ht 5' 2.5" (1.588 m)   Wt 185 lb (83.9 kg)   SpO2 99%   BMI 33.30 kg/m  Wt Readings from Last 3 Encounters:  10/02/21 185 lb (83.9 kg)  08/21/21 185  lb 6.4 oz (84.1 kg)  04/03/21 184 lb 9.6 oz (83.7 kg)     Health Maintenance Due  Topic Date Due   Zoster Vaccines- Shingrix (1 of 2) Never done  COVID-19 Vaccine (2 - Pfizer risk series) 10/28/2020   INFLUENZA VACCINE  Never done   PAP SMEAR-Modifier  06/13/2021    There are no preventive care reminders to display for this patient.  Lab Results  Component Value Date   TSH 2.57 04/03/2021   Lab Results  Component Value Date   WBC 12.1 (H) 04/03/2021   HGB 13.1 04/03/2021   HCT 39.4 04/03/2021   MCV 90.7 04/03/2021   PLT 228.0 04/03/2021   Lab Results  Component Value Date   NA 141 04/03/2021   K 3.7 04/03/2021   CO2 24 04/03/2021   GLUCOSE 68 (L) 04/03/2021   BUN 11 04/03/2021   CREATININE 0.65 04/03/2021   BILITOT 0.4 04/03/2021   ALKPHOS 67 04/03/2021   AST 19 04/03/2021   ALT 12 04/03/2021   PROT 7.5 04/03/2021   ALBUMIN 4.2 04/03/2021   CALCIUM 9.6 04/03/2021   ANIONGAP 7 11/25/2019   GFR 97.42 04/03/2021   Lab Results  Component Value Date   CHOL 224 (H) 04/03/2021   Lab Results  Component Value Date   HDL 50.30 04/03/2021   Lab Results  Component Value Date   LDLCALC 152 (H) 04/03/2021   Lab Results  Component Value Date   TRIG 109.0 04/03/2021   Lab Results  Component Value Date   CHOLHDL 4 04/03/2021   Lab Results  Component Value Date   HGBA1C 6.2 04/03/2021      Assessment & Plan:   Problem List Items Addressed This Visit       Cardiovascular and Mediastinum   Essential hypertension   Relevant Medications   benazepril-hydrochlorthiazide (LOTENSIN HCT) 20-25 MG tablet   pravastatin (PRAVACHOL) 80 MG tablet   Other Relevant Orders   Hemoglobin A1c   Lipid panel   TSH     Other   Hyperlipidemia   Relevant Medications   benazepril-hydrochlorthiazide (LOTENSIN HCT) 20-25 MG tablet   pravastatin (PRAVACHOL) 80 MG tablet   Borderline diabetes   Relevant Orders   Hemoglobin A1c   Other Visit Diagnoses     Uncontrolled  hypertension    -  Primary   Relevant Medications   benazepril-hydrochlorthiazide (LOTENSIN HCT) 20-25 MG tablet   pravastatin (PRAVACHOL) 80 MG tablet   Other Relevant Orders   CBC with Differential/Platelet   Comprehensive metabolic panel   TSH   Seasonal allergies       Morbid obesity (H[facility])       Relevant Medications   Semaglutide-Weight Management 0.25 MG/0.5ML SOAJ       Meds ordered this encounter  Medications   benazepril-hydrochlorthiazide (LOTENSIN HCT) 20-25 MG tablet    Sig: Take 1 tablet by mouth daily.    Dispense:  90 tablet    Refill:  1    Order Specific Question:   Supervising Provider    Answer:   Carlota Raspberry, JEFFREY R [[id number]]   pravastatin (PRAVACHOL) 80 MG tablet    Sig: Take 1 tablet (80 mg total) by mouth daily.    Dispense:  90 tablet    Refill:  1    Order Specific Question:   Supervising Provider    Answer:   Carlota Raspberry, JEFFREY R [[id number]]   Semaglutide-Weight Management 0.25 MG/0.5ML SOAJ    Sig: Inject 0.25 mg into the skin once a week.    Dispense:  6 mL    Refill:  1    Order Specific Question:   Supervising Provider    Answer:   Carlota Raspberry, JEFFREY R [[id number]]  Follow-up: Return in about 3 months (around 12/31/2021) for htn hld.   PLAN Htn - uncontrolled - increase therapy to benazepril-hctz as above. Return in 3 mo Increase pravastatin to 80mg  po qd Labs collected. Will follow up with the patient as warranted. Continue semaglutide Suspect fatigue related to busy week, mental health. Will follow with psychiatry. Can refer to counseling if pt would like.  Patient encouraged to call clinic with any questions, comments, or concerns.  Maximiano Coss, NP

## 2021-10-03 LAB — CBC WITH DIFFERENTIAL/PLATELET
Basophils Absolute: 0 10*3/uL (ref 0.0–0.1)
Basophils Relative: 0.4 % (ref 0.0–3.0)
Eosinophils Absolute: 0.1 10*3/uL (ref 0.0–0.7)
Eosinophils Relative: 1.2 % (ref 0.0–5.0)
HCT: 40.8 % (ref 36.0–46.0)
Hemoglobin: 13.6 g/dL (ref 12.0–15.0)
Lymphocytes Relative: 58.8 % — ABNORMAL HIGH (ref 12.0–46.0)
Lymphs Abs: 5.8 10*3/uL — ABNORMAL HIGH (ref 0.7–4.0)
MCHC: 33.4 g/dL (ref 30.0–36.0)
MCV: 90.5 fl (ref 78.0–100.0)
Monocytes Absolute: 1 10*3/uL (ref 0.1–1.0)
Monocytes Relative: 10.5 % (ref 3.0–12.0)
Neutro Abs: 2.9 10*3/uL (ref 1.4–7.7)
Neutrophils Relative %: 29.1 % — ABNORMAL LOW (ref 43.0–77.0)
Platelets: 219 10*3/uL (ref 150.0–400.0)
RBC: 4.51 Mil/uL (ref 3.87–5.11)
RDW: 13.3 % (ref 11.5–15.5)
WBC: 9.9 10*3/uL (ref 4.0–10.5)

## 2021-10-03 LAB — COMPREHENSIVE METABOLIC PANEL
ALT: 15 U/L (ref 0–35)
AST: 17 U/L (ref 0–37)
Albumin: 4.3 g/dL (ref 3.5–5.2)
Alkaline Phosphatase: 63 U/L (ref 39–117)
BUN: 13 mg/dL (ref 6–23)
CO2: 26 mEq/L (ref 19–32)
Calcium: 9.7 mg/dL (ref 8.4–10.5)
Chloride: 102 mEq/L (ref 96–112)
Creatinine, Ser: 0.62 mg/dL (ref 0.40–1.20)
GFR: 98.19 mL/min (ref >60.00)
Glucose, Bld: 100 mg/dL — ABNORMAL HIGH (ref 70–99)
Potassium: 3.6 mEq/L (ref 3.5–5.1)
Sodium: 138 mEq/L (ref 135–145)
Total Bilirubin: 0.4 mg/dL (ref 0.2–1.2)
Total Protein: 7.3 g/dL (ref 6.0–8.3)

## 2021-10-03 LAB — HEMOGLOBIN A1C: Hgb A1c MFr Bld: 6.2 % (ref 4.6–6.5)

## 2021-10-03 LAB — LIPID PANEL
Cholesterol: 203 mg/dL — ABNORMAL HIGH (ref 0–200)
HDL: 41.7 mg/dL (ref >39.00)
LDL Cholesterol: 127 mg/dL — ABNORMAL HIGH (ref 0–99)
NonHDL: 161.03
Total CHOL/HDL Ratio: 5
Triglycerides: 168 mg/dL — ABNORMAL HIGH (ref 0.0–149.0)
VLDL: 33.6 mg/dL (ref 0.0–40.0)

## 2021-10-03 LAB — TSH: TSH: 2.98 u[IU]/mL (ref 0.35–5.50)

## 2021-10-04 ENCOUNTER — Other Ambulatory Visit: Payer: Self-pay | Admitting: Registered Nurse

## 2021-10-04 ENCOUNTER — Telehealth: Payer: Self-pay | Admitting: Registered Nurse

## 2021-10-04 DIAGNOSIS — D72820 Lymphocytosis (symptomatic): Secondary | ICD-10-CM

## 2021-10-04 DIAGNOSIS — D709 Neutropenia, unspecified: Secondary | ICD-10-CM

## 2021-10-04 NOTE — Telephone Encounter (Signed)
Spoke to patient and she was concerned about her lab results. She saw the dx of "lymphocytosis" and had questions about what that meant and what are her next steps. Patient also wanted to confirm that she should still be taking the ozempic. I let her know according to her med list the ozempic was sent to the pharmacy so it appears she should still be on that but I would ask for clarification. I let her know that I would send Delfino Lovett a message about the concerns she had with her lab results and call her back

## 2021-10-04 NOTE — Telephone Encounter (Signed)
..  Caller name:Shanette Rathod  On DPR? :yes/no: Yes  Call back number:[phone]  Provider they see: Orland Mustard  Reason for call: Would like to talk about ozempic and blood test

## 2021-10-09 NOTE — Telephone Encounter (Signed)
Spoke to patient, she is aware to still take the ozempic and aware of the referral

## 2021-10-09 NOTE — Telephone Encounter (Signed)
Ok to continue ozempic. Lymphocytosis refers to her high WBC. I have referred her to a blood specialist for this.  Thank you  Rich

## 2021-10-10 ENCOUNTER — Other Ambulatory Visit: Payer: Self-pay | Admitting: Registered Nurse

## 2021-10-10 DIAGNOSIS — E782 Mixed hyperlipidemia: Secondary | ICD-10-CM

## 2021-10-10 DIAGNOSIS — I10 Essential (primary) hypertension: Secondary | ICD-10-CM

## 2021-10-16 ENCOUNTER — Other Ambulatory Visit: Payer: Self-pay | Admitting: Family

## 2021-10-16 DIAGNOSIS — D72829 Elevated white blood cell count, unspecified: Secondary | ICD-10-CM

## 2021-10-17 ENCOUNTER — Inpatient Hospital Stay: Payer: BC Managed Care – PPO | Attending: Registered Nurse

## 2021-10-17 ENCOUNTER — Inpatient Hospital Stay (HOSPITAL_BASED_OUTPATIENT_CLINIC_OR_DEPARTMENT_OTHER): Payer: BC Managed Care – PPO | Admitting: Family

## 2021-10-17 ENCOUNTER — Encounter: Payer: Self-pay | Admitting: Family

## 2021-10-17 ENCOUNTER — Inpatient Hospital Stay: Payer: BC Managed Care – PPO | Admitting: Family

## 2021-10-17 ENCOUNTER — Other Ambulatory Visit: Payer: Self-pay

## 2021-10-17 ENCOUNTER — Inpatient Hospital Stay: Payer: BC Managed Care – PPO

## 2021-10-17 VITALS — BP 156/73 | HR 71 | Temp 99.1°F | Resp 18 | Ht 62.0 in | Wt 185.0 lb

## 2021-10-17 DIAGNOSIS — B001 Herpesviral vesicular dermatitis: Secondary | ICD-10-CM | POA: Insufficient documentation

## 2021-10-17 DIAGNOSIS — M5136 Other intervertebral disc degeneration, lumbar region: Secondary | ICD-10-CM | POA: Insufficient documentation

## 2021-10-17 DIAGNOSIS — Z8041 Family history of malignant neoplasm of ovary: Secondary | ICD-10-CM | POA: Diagnosis not present

## 2021-10-17 DIAGNOSIS — F419 Anxiety disorder, unspecified: Secondary | ICD-10-CM | POA: Diagnosis not present

## 2021-10-17 DIAGNOSIS — R0789 Other chest pain: Secondary | ICD-10-CM | POA: Insufficient documentation

## 2021-10-17 DIAGNOSIS — R5383 Other fatigue: Secondary | ICD-10-CM | POA: Diagnosis not present

## 2021-10-17 DIAGNOSIS — R0602 Shortness of breath: Secondary | ICD-10-CM | POA: Insufficient documentation

## 2021-10-17 DIAGNOSIS — D72829 Elevated white blood cell count, unspecified: Secondary | ICD-10-CM | POA: Insufficient documentation

## 2021-10-17 DIAGNOSIS — Z800 Family history of malignant neoplasm of digestive organs: Secondary | ICD-10-CM | POA: Insufficient documentation

## 2021-10-17 DIAGNOSIS — Z87891 Personal history of nicotine dependence: Secondary | ICD-10-CM | POA: Diagnosis not present

## 2021-10-17 LAB — CMP (CANCER CENTER ONLY)
ALT: 12 U/L (ref 0–44)
AST: 17 U/L (ref 15–41)
Albumin: 4.2 g/dL (ref 3.5–5.0)
Alkaline Phosphatase: 59 U/L (ref 38–126)
Anion gap: 9 (ref 5–15)
BUN: 13 mg/dL (ref 6–20)
CO2: 29 mmol/L (ref 22–32)
Calcium: 9.9 mg/dL (ref 8.9–10.3)
Chloride: 101 mmol/L (ref 98–111)
Creatinine: 0.67 mg/dL (ref 0.44–1.00)
GFR, Estimated: >60 mL/min (ref >60)
Glucose, Bld: 144 mg/dL — ABNORMAL HIGH (ref 70–99)
Potassium: 3.3 mmol/L — ABNORMAL LOW (ref 3.5–5.1)
Sodium: 139 mmol/L (ref 135–145)
Total Bilirubin: 0.4 mg/dL (ref 0.3–1.2)
Total Protein: 7.4 g/dL (ref 6.5–8.1)

## 2021-10-17 LAB — CBC WITH DIFFERENTIAL (CANCER CENTER ONLY)
Abs Immature Granulocytes: 0.02 10*3/uL (ref 0.00–0.07)
Basophils Absolute: 0.1 10*3/uL (ref 0.0–0.1)
Basophils Relative: 1 %
Eosinophils Absolute: 0.1 10*3/uL (ref 0.0–0.5)
Eosinophils Relative: 1 %
HCT: 39.7 % (ref 36.0–46.0)
Hemoglobin: 13.3 g/dL (ref 12.0–15.0)
Immature Granulocytes: 0 %
Lymphocytes Relative: 59 %
Lymphs Abs: 5.5 10*3/uL — ABNORMAL HIGH (ref 0.7–4.0)
MCH: 30.2 pg (ref 26.0–34.0)
MCHC: 33.5 g/dL (ref 30.0–36.0)
MCV: 90 fL (ref 80.0–100.0)
Monocytes Absolute: 0.6 10*3/uL (ref 0.1–1.0)
Monocytes Relative: 6 %
Neutro Abs: 3.1 10*3/uL (ref 1.7–7.7)
Neutrophils Relative %: 33 %
Platelet Count: 210 10*3/uL (ref 150–400)
RBC: 4.41 MIL/uL (ref 3.87–5.11)
RDW: 12.5 % (ref 11.5–15.5)
WBC Count: 9.3 10*3/uL (ref 4.0–10.5)
nRBC: 0 % (ref 0.0–0.2)

## 2021-10-17 LAB — LACTATE DEHYDROGENASE: LDH: 160 U/L (ref 98–192)

## 2021-10-17 LAB — SAVE SMEAR(SSMR), FOR PROVIDER SLIDE REVIEW

## 2021-10-17 NOTE — Progress Notes (Signed)
Hematology and Oncology Follow Up Visit  Linda Kerr [id number] 19-Apr-1961 60 y.o. 10/17/2021   Principle Diagnosis:  Leukocytosis possibly reactive, flow cytometry negative 11/25/2019  Current Therapy:   Observation   Interim History:  Linda Kerr is here today to re-establish care for lymphocytosis and neutropenia.  No personal history of cancer. Linda Kerr had ovarian cancer and Linda Kerr had colon cancer.  She notes fatigue and states that she is under a lot of stress with family, work and the holiday season.  She has a small cold sore on Linda bottom lip which she states occurs when she is stressed. She will try Abreva cream.  No fever, chills, n/v, cough, rash, dizziness, palpitations, abdominal pain or changes in bowel or bladder habits.  She has occasional chest tightness and SOB with anxiety.  No swelling, tenderness, numbness or tingling in Linda extremities.  She has history of degenerative disc disease in Linda spine and has intermittent twinges of pain.  No falls or syncope reported.  She has maintained a good appetite and is staying well hydrated. Weight i She has been doing Zumba and step classes for exercise.  She quit smoking 30 years ago. She has the occasional glass of wine or mixed drink socially.   ECOG Performance Status: 1 - Symptomatic but completely ambulatory  Medications:  Allergies as of 10/17/2021       Reactions   Fish Oil Rash   Penicillins Rash        Medication List        Accurate as of October 17, 2021  2:13 PM. If you have any questions, ask your nurse or doctor.          ALPRAZolam 0.5 MG tablet Commonly known as: XANAX Take 1 tablet (0.5 mg total) by mouth 2 (two) times daily as needed for anxiety.   benazepril-hydrochlorthiazide 20-25 MG tablet Commonly known as: LOTENSIN HCT Take 1 tablet by mouth daily.   divalproex 250 MG 24 hr tablet Commonly known as: DEPAKOTE ER Take 3 tablets (750 mg total) by mouth at  bedtime.   hydrochlorothiazide 25 MG tablet Commonly known as: HYDRODIURIL TAKE 1 TABLET (25 MG TOTAL) BY MOUTH DAILY. DUE FOR PHYSICAL IN JANUARY   Klor-Con M20 20 MEQ tablet Generic drug: potassium chloride SA TAKE 1 TABLET (20 MEQ TOTAL) BY MOUTH DAILY. DUE FOR PHYSICAL IN JANUARY   NON FORMULARY [place]   norethindrone-ethinyl estradiol 1-5 MG-MCG Tabs tablet Commonly known as: FEMHRT 1/5 Take 1 tablet by mouth daily.   pravastatin 80 MG tablet Commonly known as: PRAVACHOL Take 1 tablet (80 mg total) by mouth daily.   pravastatin 40 MG tablet Commonly known as: PRAVACHOL TAKE 1 TABLET BY MOUTH EVERY DAY   Semaglutide-Weight Management 0.25 MG/0.5ML Soaj Inject 0.25 mg into the skin once a week.   sertraline 50 MG tablet Commonly known as: ZOLOFT Take 1 tablet (50 mg total) by mouth at bedtime.   zolpidem 5 MG tablet Commonly known as: AMBIEN Take 1 tablet (5 mg total) by mouth at bedtime as needed for sleep.        Allergies:  Allergies  Allergen Reactions   Fish Oil Rash   Penicillins Rash    Past Medical History, Surgical history, Social history, and Family History were reviewed and updated.  Review of Systems: All other 10 point review of systems is negative.   Physical Exam:  height is 5\' 2"  (1.575 m) and weight is 185 lb (83.9 kg).  Linda oral temperature is 99.1 F (37.3 C). Linda blood pressure is 156/73 (abnormal) and Linda pulse is 71. Linda respiration is 18 and oxygen saturation is 99%.   Wt Readings from Last 3 Encounters:  10/17/21 185 lb (83.9 kg)  10/02/21 185 lb (83.9 kg)  08/21/21 185 lb 6.4 oz (84.1 kg)    Ocular: Sclerae unicteric, pupils equal, round and reactive to light Ear-nose-throat: Oropharynx clear, dentition fair Lymphatic: No cervical or supraclavicular adenopathy Lungs no rales or rhonchi, good excursion bilaterally Heart regular rate and rhythm, no murmur appreciated Abd soft, nontender, positive bowel sounds MSK no focal  spinal tenderness, no joint edema Neuro: non-focal, well-oriented, appropriate affect Breasts: Deferred   Lab Results  Component Value Date   WBC 9.3 10/17/2021   HGB 13.3 10/17/2021   HCT 39.7 10/17/2021   MCV 90.0 10/17/2021   PLT 210 10/17/2021   No results found for: FERRITIN, IRON, TIBC, UIBC, IRONPCTSAT Lab Results  Component Value Date   RBC 4.41 10/17/2021   No results found for: KPAFRELGTCHN, LAMBDASER, KAPLAMBRATIO No results found for: IGGSERUM, IGA, IGMSERUM No results found for: Odetta Pink, SPEI   Chemistry      Component Value Date/Time   NA 139 10/17/2021 1302   K 3.3 (L) 10/17/2021 1302   CL 101 10/17/2021 1302   CO2 29 10/17/2021 1302   BUN 13 10/17/2021 1302   CREATININE 0.67 10/17/2021 1302   CREATININE 0.60 11/13/2019 1505      Component Value Date/Time   CALCIUM 9.9 10/17/2021 1302   ALKPHOS 59 10/17/2021 1302   AST 17 10/17/2021 1302   ALT 12 10/17/2021 1302   BILITOT 0.4 10/17/2021 1302       Impression and Plan: Linda Kerr is a very pleasant 60 yo African American female with mild leukocytosis and neutropenia. WBC count was 9.3, neutrophils 33% and lymphocytes 59%.  Flow cytometry pending.  Once we have Linda results we will determine follow-up.    Lottie Dawson, NP 12/20/20222:13 PM

## 2021-10-18 LAB — SURGICAL PATHOLOGY

## 2021-10-19 ENCOUNTER — Telehealth: Payer: Self-pay | Admitting: *Deleted

## 2021-10-19 NOTE — Telephone Encounter (Signed)
Per 10/17/21 los - lab results pending

## 2021-10-20 ENCOUNTER — Encounter: Payer: Self-pay | Admitting: *Deleted

## 2021-10-20 LAB — FLOW CYTOMETRY

## 2021-11-27 ENCOUNTER — Ambulatory Visit: Payer: BC Managed Care – PPO | Admitting: Registered Nurse

## 2021-12-04 ENCOUNTER — Ambulatory Visit (INDEPENDENT_AMBULATORY_CARE_PROVIDER_SITE_OTHER): Payer: BC Managed Care – PPO | Admitting: Registered Nurse

## 2021-12-04 ENCOUNTER — Other Ambulatory Visit: Payer: Self-pay

## 2021-12-04 ENCOUNTER — Encounter: Payer: Self-pay | Admitting: Registered Nurse

## 2021-12-04 MED ORDER — SEMAGLUTIDE (2 MG/DOSE) 8 MG/3ML ~~LOC~~ SOPN: 2.0000 mg | PEN_INJECTOR | SUBCUTANEOUS | 1 refills | Status: DC

## 2021-12-04 MED ORDER — SEMAGLUTIDE(0.25 OR 0.5MG/DOS) 2 MG/1.5ML ~~LOC~~ SOPN: 0.5000 mg | PEN_INJECTOR | SUBCUTANEOUS | 0 refills | Status: DC

## 2021-12-04 MED ORDER — SEMAGLUTIDE (1 MG/DOSE) 4 MG/3ML ~~LOC~~ SOPN: 1.0000 mg | PEN_INJECTOR | SUBCUTANEOUS | 0 refills | Status: DC

## 2021-12-04 NOTE — Patient Instructions (Signed)
° ° ° °  If you have lab work done today you will be contacted with your lab results within the next 2 weeks.  If you have not heard from us then please contact us. The fastest way to get your results is to register for My Chart. ° ° °IF you received an x-ray today, you will receive an invoice from [facility]. Please contact [facility] at [phone] with questions or concerns regarding your invoice.  ° °IF you received labwork today, you will receive an invoice from [facility]. Please contact [facility] at [phone] with questions or concerns regarding your invoice.  ° °Our billing staff will not be able to assist you with questions regarding bills from these companies. ° °You will be contacted with the lab results as soon as they are available. The fastest way to get your results is to activate your My Chart account. Instructions are located on the last page of this paperwork. If you have not heard from us regarding the results in 2 weeks, please contact this office. °  ° ° ° °

## 2021-12-04 NOTE — Progress Notes (Signed)
Established Patient Office Visit  Subjective:  Patient ID: Linda Kerr, female    DOB: September 10, 1961  Age: 61 y.o. MRN: [id number]  CC:  Chief Complaint  Patient presents with   Follow-up    Patient srates she is here for follow up on weight.    HPI Linda Kerr presents for follow up on weight loss  Has been on semaglutide 0.25mg  subq weekly Doing well, no AE She has lost 4 lbs since last visit.   Past Medical History:  Diagnosis Date   Allergy    Anemia    Anxiety    Arthritis    Bipolar disorder ([place])    Depression    Hyperlipidemia    Sleep apnea 07/29/2017    Past Surgical History:  Procedure Laterality Date   CESAREAN SECTION     SIGMOIDOSCOPY      Family History  Problem Relation Age of Onset   Hypertension Other    Coronary artery disease Other    Colon cancer Other    Diabetes Other    Heart disease Mother    Hypertension Mother    Diabetes Father    Hypertension Father     Social History   Socioeconomic History   Marital status: Married    Spouse name: Not on file   Number of children: 1   Years of education: Not on file   Highest education level: Not on file  Occupational History   Occupation: SCHOOL COUNSELOR    Employer: [employer]  Tobacco Use   Smoking status: Former    Types: Cigarettes    Quit date: 10/29/1988    Years since quitting: 33.1   Smokeless tobacco: Never  Vaping Use   Vaping Use: Never used  Substance and Sexual Activity   Alcohol use: Yes    Alcohol/week: 0.0 standard drinks    Comment: rare   Drug use: No   Sexual activity: Yes  Other Topics Concern   Not on file  Social History Narrative   Not on file   Social Determinants of Health   Financial Resource Strain: Not on file  Food Insecurity: Not on file  Transportation Needs: Not on file  Physical Activity: Not on file  Stress: Not on file  Social Connections: Not on file  Intimate Partner Violence: Not on file    Outpatient Medications  Prior to Visit  Medication Sig Dispense Refill   ALPRAZolam (XANAX) 0.5 MG tablet Take 1 tablet (0.5 mg total) by mouth 2 (two) times daily as needed for anxiety. 30 tablet 1   benazepril-hydrochlorthiazide (LOTENSIN HCT) 20-25 MG tablet Take 1 tablet by mouth daily. 90 tablet 1   divalproex (DEPAKOTE ER) 250 MG 24 hr tablet Take 3 tablets (750 mg total) by mouth at bedtime. 270 tablet 3   hydrochlorothiazide (HYDRODIURIL) 25 MG tablet TAKE 1 TABLET (25 MG TOTAL) BY MOUTH DAILY. DUE FOR PHYSICAL IN JANUARY 90 tablet 1   KLOR-CON M20 20 MEQ tablet TAKE 1 TABLET (20 MEQ TOTAL) BY MOUTH DAILY. DUE FOR PHYSICAL IN JANUARY 90 tablet 1   NON FORMULARY [place]     norethindrone-ethinyl estradiol (FEMHRT 1/5) 1-5 MG-MCG TABS tablet Take 1 tablet by mouth daily.     pravastatin (PRAVACHOL) 40 MG tablet TAKE 1 TABLET BY MOUTH EVERY DAY 90 tablet 1   pravastatin (PRAVACHOL) 80 MG tablet Take 1 tablet (80 mg total) by mouth daily. 90 tablet 1   Semaglutide-Weight Management 0.25 MG/0.5ML SOAJ Inject  0.25 mg into the skin once a week. 6 mL 1   sertraline (ZOLOFT) 50 MG tablet Take 1 tablet (50 mg total) by mouth at bedtime. 90 tablet 3   zolpidem (AMBIEN) 5 MG tablet Take 1 tablet (5 mg total) by mouth at bedtime as needed for sleep. 30 tablet 1   No facility-administered medications prior to visit.    Allergies  Allergen Reactions   Fish Oil Rash   Penicillins Rash    ROS Review of Systems    Objective:    Physical Exam  BP (!) 146/68    Pulse 67    Temp 98.2 F (36.8 C) (Temporal)    Resp 18    Ht 5\' 2"  (1.575 m)    Wt 181 lb 3.2 oz (82.2 kg)    SpO2 99%    BMI 33.14 kg/m  Wt Readings from Last 3 Encounters:  12/04/21 181 lb 3.2 oz (82.2 kg)  10/17/21 185 lb (83.9 kg)  10/02/21 185 lb (83.9 kg)     Health Maintenance Due  Topic Date Due   Zoster Vaccines- Shingrix (1 of 2) Never done   COVID-19 Vaccine (2 - Pfizer risk series) 10/28/2020   INFLUENZA VACCINE  Never done   PAP  SMEAR-Modifier  06/13/2021    There are no preventive care reminders to display for this patient.  Lab Results  Component Value Date   TSH 2.98 10/02/2021   Lab Results  Component Value Date   WBC 9.3 10/17/2021   HGB 13.3 10/17/2021   HCT 39.7 10/17/2021   MCV 90.0 10/17/2021   PLT 210 10/17/2021   Lab Results  Component Value Date   NA 139 10/17/2021   K 3.3 (L) 10/17/2021   CO2 29 10/17/2021   GLUCOSE 144 (H) 10/17/2021   BUN 13 10/17/2021   CREATININE 0.67 10/17/2021   BILITOT 0.4 10/17/2021   ALKPHOS 59 10/17/2021   AST 17 10/17/2021   ALT 12 10/17/2021   PROT 7.4 10/17/2021   ALBUMIN 4.2 10/17/2021   CALCIUM 9.9 10/17/2021   ANIONGAP 9 10/17/2021   GFR 98.19 10/02/2021   Lab Results  Component Value Date   CHOL 203 (H) 10/02/2021   Lab Results  Component Value Date   HDL 41.70 10/02/2021   Lab Results  Component Value Date   LDLCALC 127 (H) 10/02/2021   Lab Results  Component Value Date   TRIG 168.0 (H) 10/02/2021   Lab Results  Component Value Date   CHOLHDL 5 10/02/2021   Lab Results  Component Value Date   HGBA1C 6.2 10/02/2021      Assessment & Plan:   Problem List Items Addressed This Visit   None Visit Diagnoses     Morbid obesity ([place])    -  Primary   Relevant Medications   Semaglutide,0.25 or 0.5MG /DOS, 2 MG/1.5ML SOPN   Semaglutide, 1 MG/DOSE, 4 MG/3ML SOPN   Semaglutide, 2 MG/DOSE, 8 MG/3ML SOPN       Meds ordered this encounter  Medications   Semaglutide,0.25 or 0.5MG /DOS, 2 MG/1.5ML SOPN    Sig: Inject 0.5 mg into the skin once a week.    Dispense:  1.5 mL    Refill:  0    Order Specific Question:   Supervising Provider    Answer:   Carlota Raspberry, JEFFREY R [[id number]]   Semaglutide, 1 MG/DOSE, 4 MG/3ML SOPN    Sig: Inject 1 mg as directed once a week.    Dispense:  3 mL  Refill:  0    Order Specific Question:   Supervising Provider    Answer:   Carlota Raspberry, JEFFREY R [[id number]]   Semaglutide, 2 MG/DOSE, 8 MG/3ML SOPN    Sig:  Inject 2 mg as directed once a week.    Dispense:  9 mL    Refill:  1    Order Specific Question:   Supervising Provider    Answer:   Carlota Raspberry, JEFFREY R [[id number]]    Follow-up: Return in about 6 months (around 06/03/2022) for CPE.   PLAN Doing very well. Will ramp up dose.  Follow up in 6 mo Patient encouraged to call clinic with any questions, comments, or concerns.  Maximiano Coss, NP

## 2022-01-01 ENCOUNTER — Other Ambulatory Visit: Payer: Self-pay

## 2022-01-01 ENCOUNTER — Ambulatory Visit (INDEPENDENT_AMBULATORY_CARE_PROVIDER_SITE_OTHER): Payer: BC Managed Care – PPO | Admitting: Registered Nurse

## 2022-01-01 ENCOUNTER — Encounter: Payer: Self-pay | Admitting: Registered Nurse

## 2022-01-01 VITALS — BP 140/69 | HR 71 | Temp 98.0°F | Resp 18 | Ht 62.0 in | Wt 180.0 lb

## 2022-01-01 DIAGNOSIS — Z832 Family history of diseases of the blood and blood-forming organs and certain disorders involving the immune mechanism: Secondary | ICD-10-CM

## 2022-01-01 DIAGNOSIS — D863 Sarcoidosis of skin: Secondary | ICD-10-CM | POA: Diagnosis not present

## 2022-01-01 DIAGNOSIS — I10 Essential (primary) hypertension: Secondary | ICD-10-CM | POA: Diagnosis not present

## 2022-01-01 NOTE — Patient Instructions (Addendum)
Linda Kerr-  ? ?Great to see you ? ?Labs will be back this week ? ?Call if concerns arise ? ?CT chest will be ordered. ? ?See you in 6 mo. Keep up the great work on the BP and the weight!!! ? ?Thanks, ? ?Rich  ? ? ? ?If you have lab work done today you will be contacted with your lab results within the next 2 weeks.  If you have not heard from [place] then please contact us. The fastest way to get your results is to register for My Chart. ? ? ?IF you received an x-ray today, you will receive an invoice from [facility]. Please contact [facility] at [phone] with questions or concerns regarding your invoice.  ? ?IF you received labwork today, you will receive an invoice from [facility]. Please contact [facility] at [phone] with questions or concerns regarding your invoice.  ? ?Our billing staff will not be able to assist you with questions regarding bills from these companies. ? ?You will be contacted with the lab results as soon as they are available. The fastest way to get your results is to activate your My Chart account. Instructions are located on the last page of this paperwork. If you have not heard from [place] regarding the results in 2 weeks, please contact this office. ?  ? ? ?

## 2022-01-01 NOTE — Progress Notes (Signed)
Established Patient Office Visit  Subjective:  Patient ID: Linda Kerr, female    DOB: [birth year]  Age: 61 y.o. MRN: [id number]  CC:  Chief Complaint  Patient presents with   Follow-up    Patient states she is here for a follow up. Patient would like her face from the dermatologist.    HPI Linda Kerr presents for htn, dermatologist follow up   Hypertension: Patient Currently taking: hctz $RemoveBefo'25mg'AVVwriGwoCR$  po qd, benazepril 20-$RemoveBeforeDEI'25mg'uxlErrloeeJWMDFN$  po qd,  Good effect. No AEs. Denies CV symptoms including: chest pain, shob, doe, headache, visual changes, fatigue, claudication, and dependent edema.   Previous readings and labs: BP Readings from Last 3 Encounters:  01/01/22 140/69  12/04/21 (!) 146/68  10/17/21 (!) 156/73   Lab Results  Component Value Date   CREATININE 0.67 10/17/2021    Dermatology follow up  Seen for skin lesion L side of nose at bridge of nose.  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  She does note RA and DJD in the lumbar spine tends to run in family.  Concerned that she has sarcoidosis in the lungs, other autoimmune condition  Past Medical History:  Diagnosis Date   Allergy    Anemia    Anxiety    Arthritis    Bipolar disorder ([place])    Depression    Hyperlipidemia    Sleep apnea 07/29/2017    Past Surgical History:  Procedure Laterality Date   CESAREAN SECTION     SIGMOIDOSCOPY      Family History  Problem Relation Age of Onset   Hypertension Other    Coronary artery disease Other    Colon cancer Other    Diabetes Other    Heart disease Mother    Hypertension Mother    Diabetes Father    Hypertension Father     Social History   Socioeconomic History   Marital status: Married    Spouse name: Not on file   Number of children: 1   Years of education: Not on file   Highest education level: Not on file  Occupational History   Occupation: SCHOOL COUNSELOR    Employer: [employer]  Tobacco Use   Smoking status: Former    Types: Cigarettes     Quit date: 10/29/1988    Years since quitting: 33.1   Smokeless tobacco: Never  Vaping Use   Vaping Use: Never used  Substance and Sexual Activity   Alcohol use: Yes    Alcohol/week: 0.0 standard drinks    Comment: rare   Drug use: No   Sexual activity: Yes  Other Topics Concern   Not on file  Social History Narrative   Not on file   Social Determinants of Health   Financial Resource Strain: Not on file  Food Insecurity: Not on file  Transportation Needs: Not on file  Physical Activity: Not on file  Stress: Not on file  Social Connections: Not on file  Intimate Partner Violence: Not on file    Outpatient Medications Prior to Visit  Medication Sig Dispense Refill   ALPRAZolam (XANAX) 0.5 MG tablet Take 1 tablet (0.5 mg total) by mouth 2 (two) times daily as needed for anxiety. 30 tablet 1   benazepril-hydrochlorthiazide (LOTENSIN HCT) 20-25 MG tablet Take 1 tablet by mouth daily. 90 tablet 1   divalproex (DEPAKOTE ER) 250 MG 24 hr tablet Take 3 tablets (750 mg total) by mouth at bedtime. 270 tablet 3   hydrochlorothiazide (HYDRODIURIL) 25 MG tablet TAKE 1  TABLET (25 MG TOTAL) BY MOUTH DAILY. DUE FOR PHYSICAL IN JANUARY 90 tablet 1   KLOR-CON M20 20 MEQ tablet TAKE 1 TABLET (20 MEQ TOTAL) BY MOUTH DAILY. DUE FOR PHYSICAL IN JANUARY 90 tablet 1   NON FORMULARY [place]     norethindrone-ethinyl estradiol (FEMHRT 1/5) 1-5 MG-MCG TABS tablet Take 1 tablet by mouth daily.     pravastatin (PRAVACHOL) 40 MG tablet TAKE 1 TABLET BY MOUTH EVERY DAY 90 tablet 1   pravastatin (PRAVACHOL) 80 MG tablet Take 1 tablet (80 mg total) by mouth daily. 90 tablet 1   Semaglutide, 1 MG/DOSE, 4 MG/3ML SOPN Inject 1 mg as directed once a week. 3 mL 0   Semaglutide, 2 MG/DOSE, 8 MG/3ML SOPN Inject 2 mg as directed once a week. 9 mL 1   Semaglutide,0.25 or 0.5MG /DOS, 2 MG/1.5ML SOPN Inject 0.5 mg into the skin once a week. 1.5 mL 0   Semaglutide-Weight Management 0.25 MG/0.5ML SOAJ Inject 0.25 mg into  the skin once a week. 6 mL 1   sertraline (ZOLOFT) 50 MG tablet Take 1 tablet (50 mg total) by mouth at bedtime. 90 tablet 3   zolpidem (AMBIEN) 5 MG tablet Take 1 tablet (5 mg total) by mouth at bedtime as needed for sleep. 30 tablet 1   No facility-administered medications prior to visit.    Allergies  Allergen Reactions   Fish Oil Rash   Penicillins Rash    ROS Review of Systems  Constitutional: Negative.   HENT: Negative.    Eyes: Negative.   Respiratory: Negative.    Cardiovascular: Negative.   Gastrointestinal: Negative.   Endocrine: Negative.   Genitourinary: Negative.   Musculoskeletal: Negative.   Skin: Negative.   Allergic/Immunologic: Negative.   Neurological: Negative.   Hematological: Negative.   Psychiatric/Behavioral: Negative.    All other systems reviewed and are negative.    Objective:    Physical Exam Vitals and nursing note reviewed.  Constitutional:      General: She is not in acute distress.    Appearance: Normal appearance. She is normal weight. She is not ill-appearing, toxic-appearing or diaphoretic.  Cardiovascular:     Rate and Rhythm: Normal rate and regular rhythm.     Heart sounds: Normal heart sounds. No murmur heard.   No friction rub. No gallop.  Pulmonary:     Effort: Pulmonary effort is normal. No respiratory distress.     Breath sounds: Normal breath sounds. No stridor. No wheezing, rhonchi or rales.  Chest:     Chest wall: No tenderness.  Skin:    General: Skin is warm and dry.  Neurological:     General: No focal deficit present.     Mental Status: She is alert and oriented to person, place, and time. Mental status is at baseline.  Psychiatric:        Mood and Affect: Mood normal.        Behavior: Behavior normal.        Thought Content: Thought content normal.        Judgment: Judgment normal.    BP 140/69    Pulse 71    Temp 98 F (36.7 C) (Temporal)    Resp 18    Ht 5\' 2"  (1.575 m)    Wt 180 lb (81.6 kg)    SpO2  100%    BMI 32.92 kg/m  Wt Readings from Last 3 Encounters:  01/01/22 180 lb (81.6 kg)  12/04/21 181 lb 3.2 oz (82.2  kg)  10/17/21 185 lb (83.9 kg)     Health Maintenance Due  Topic Date Due   Zoster Vaccines- Shingrix (1 of 2) Never done   COVID-19 Vaccine (2 - Pfizer risk series) 10/28/2020   INFLUENZA VACCINE  Never done   PAP SMEAR-Modifier  06/13/2021    There are no preventive care reminders to display for this patient.  Lab Results  Component Value Date   TSH 2.98 10/02/2021   Lab Results  Component Value Date   WBC 9.3 10/17/2021   HGB 13.3 10/17/2021   HCT 39.7 10/17/2021   MCV 90.0 10/17/2021   PLT 210 10/17/2021   Lab Results  Component Value Date   NA 139 10/17/2021   K 3.3 (L) 10/17/2021   CO2 29 10/17/2021   GLUCOSE 144 (H) 10/17/2021   BUN 13 10/17/2021   CREATININE 0.67 10/17/2021   BILITOT 0.4 10/17/2021   ALKPHOS 59 10/17/2021   AST 17 10/17/2021   ALT 12 10/17/2021   PROT 7.4 10/17/2021   ALBUMIN 4.2 10/17/2021   CALCIUM 9.9 10/17/2021   ANIONGAP 9 10/17/2021   GFR 98.19 10/02/2021   Lab Results  Component Value Date   CHOL 203 (H) 10/02/2021   Lab Results  Component Value Date   HDL 41.70 10/02/2021   Lab Results  Component Value Date   LDLCALC 127 (H) 10/02/2021   Lab Results  Component Value Date   TRIG 168.0 (H) 10/02/2021   Lab Results  Component Value Date   CHOLHDL 5 10/02/2021   Lab Results  Component Value Date   HGBA1C 6.2 10/02/2021      Assessment & Plan:   Problem List Items Addressed This Visit       Cardiovascular and Mediastinum   Essential hypertension   Relevant Orders   ANA,IFA RA Diag Pnl w/rflx Tit/Patn   CBC with Differential/Platelet   C-reactive protein   Sedimentation rate   Other Visit Diagnoses     Cutaneous sarcoidosis    -  Primary   Relevant Orders   ANA,IFA RA Diag Pnl w/rflx Tit/Patn   CBC with Differential/Platelet   C-reactive protein   Sedimentation rate   Family  history of autoimmune disorder       Relevant Orders   ANA,IFA RA Diag Pnl w/rflx Tit/Patn   CBC with Differential/Platelet   C-reactive protein   Sedimentation rate       No orders of the defined types were placed in this encounter.   Follow-up: Return in about 6 months (around 07/04/2022) for htn, weight.   PLAN BP borderline but improved. With her continued weight loss, can maintain current regimen.  Will collect labs ANA, CRP, ESR, and order CT chest.  Return in 6 mo Patient encouraged to call clinic with any questions, comments, or concerns.  Maximiano Coss, NP

## 2022-01-02 LAB — CBC WITH DIFFERENTIAL/PLATELET
Basophils Absolute: 0.1 10*3/uL (ref 0.0–0.1)
Basophils Relative: 0.9 % (ref 0.0–3.0)
Eosinophils Absolute: 0.2 10*3/uL (ref 0.0–0.7)
Eosinophils Relative: 1.5 % (ref 0.0–5.0)
HCT: 39.1 % (ref 36.0–46.0)
Hemoglobin: 13.2 g/dL (ref 12.0–15.0)
Lymphocytes Relative: 60.5 % — ABNORMAL HIGH (ref 12.0–46.0)
Lymphs Abs: 6.6 10*3/uL — ABNORMAL HIGH (ref 0.7–4.0)
MCHC: 33.7 g/dL (ref 30.0–36.0)
MCV: 91.9 fl (ref 78.0–100.0)
Monocytes Absolute: 1.1 10*3/uL — ABNORMAL HIGH (ref 0.1–1.0)
Monocytes Relative: 9.9 % (ref 3.0–12.0)
Neutro Abs: 3 10*3/uL (ref 1.4–7.7)
Neutrophils Relative %: 27.2 % — ABNORMAL LOW (ref 43.0–77.0)
Platelets: 202 10*3/uL (ref 150.0–400.0)
RBC: 4.25 Mil/uL (ref 3.87–5.11)
RDW: 13.5 % (ref 11.5–15.5)
WBC: 10.9 10*3/uL — ABNORMAL HIGH (ref 4.0–10.5)

## 2022-01-02 LAB — SEDIMENTATION RATE: Sed Rate: 27 mm/hr (ref 0–30)

## 2022-01-02 LAB — C-REACTIVE PROTEIN: CRP: <1 mg/dL (ref 0.5–20.0)

## 2022-01-04 LAB — ANA,IFA RA DIAG PNL W/RFLX TIT/PATN
Anti Nuclear Antibody (ANA): NEGATIVE
Cyclic Citrullin Peptide Ab: <16 UNITS
Rheumatoid fact SerPl-aCnc: <14 IU/mL (ref <14)

## 2022-03-10 ENCOUNTER — Other Ambulatory Visit: Payer: Self-pay | Admitting: Registered Nurse

## 2022-03-10 DIAGNOSIS — J302 Other seasonal allergic rhinitis: Secondary | ICD-10-CM

## 2022-03-29 ENCOUNTER — Other Ambulatory Visit: Payer: Self-pay | Admitting: Registered Nurse

## 2022-03-29 DIAGNOSIS — E782 Mixed hyperlipidemia: Secondary | ICD-10-CM

## 2022-03-29 DIAGNOSIS — I10 Essential (primary) hypertension: Secondary | ICD-10-CM

## 2022-04-11 ENCOUNTER — Other Ambulatory Visit: Payer: Self-pay | Admitting: Registered Nurse

## 2022-04-11 DIAGNOSIS — I10 Essential (primary) hypertension: Secondary | ICD-10-CM

## 2022-05-25 ENCOUNTER — Telehealth: Payer: Self-pay | Admitting: Registered Nurse

## 2022-05-25 NOTE — Telephone Encounter (Signed)
Pt was seeing NP Orland Mustard, but would like to know what can she do to transfer to Dr. Volanda Napoleon.   Reason: Pt states NP Orland Mustard has moved.  Pt has [place] PPO  Please advise.   [phone]

## 2022-05-28 NOTE — Telephone Encounter (Signed)
Fine by me!  Thanks,  Denice Paradise

## 2022-05-31 NOTE — Telephone Encounter (Signed)
Ok

## 2022-06-07 ENCOUNTER — Other Ambulatory Visit: Payer: Self-pay

## 2022-06-07 ENCOUNTER — Telehealth: Payer: Self-pay

## 2022-06-07 ENCOUNTER — Telehealth: Payer: Self-pay | Admitting: Registered Nurse

## 2022-06-07 MED ORDER — SEMAGLUTIDE (2 MG/DOSE) 8 MG/3ML ~~LOC~~ SOPN: 2.0000 mg | PEN_INJECTOR | SUBCUTANEOUS | 1 refills | Status: DC

## 2022-06-07 NOTE — Telephone Encounter (Signed)
Patient PA has been submitted for Ozempic

## 2022-06-07 NOTE — Telephone Encounter (Signed)
Patient medication was denied maybe we can discuss this at her OV on 06/12/2022

## 2022-06-07 NOTE — Telephone Encounter (Signed)
Refill sent to pharmacy.   

## 2022-06-07 NOTE — Telephone Encounter (Signed)
Encourage patient to contact the pharmacy for refills or they can request refills through [facility]  (Please schedule appointment if patient has not been seen in over a year)    WHAT [place] TO: [facility] [place] [phone]  MEDICATION NAME & DOSE: semaglutide 2 mg   NOTES/COMMENTS FROM PATIENT:      [place] office please notify patient: It takes 48-72 hours to process rx refill requests Ask patient to call pharmacy to ensure rx is ready before heading there.

## 2022-06-12 ENCOUNTER — Ambulatory Visit: Payer: BC Managed Care – PPO | Admitting: Registered Nurse

## 2022-06-12 ENCOUNTER — Telehealth: Payer: Self-pay | Admitting: Registered Nurse

## 2022-06-12 ENCOUNTER — Ambulatory Visit (INDEPENDENT_AMBULATORY_CARE_PROVIDER_SITE_OTHER): Payer: BC Managed Care – PPO | Admitting: Family Medicine

## 2022-06-12 ENCOUNTER — Encounter: Payer: Self-pay | Admitting: Family Medicine

## 2022-06-12 VITALS — BP 130/82 | HR 91 | Temp 98.2°F | Resp 18 | Ht 62.0 in | Wt 180.6 lb

## 2022-06-12 DIAGNOSIS — Z1159 Encounter for screening for other viral diseases: Secondary | ICD-10-CM | POA: Diagnosis not present

## 2022-06-12 DIAGNOSIS — I10 Essential (primary) hypertension: Secondary | ICD-10-CM

## 2022-06-12 DIAGNOSIS — Z0000 Encounter for general adult medical examination without abnormal findings: Secondary | ICD-10-CM | POA: Diagnosis not present

## 2022-06-12 DIAGNOSIS — E669 Obesity, unspecified: Secondary | ICD-10-CM

## 2022-06-12 LAB — LIPID PANEL
Cholesterol: 154 mg/dL (ref 0–200)
HDL: 45 mg/dL (ref >39.00)
LDL Cholesterol: 89 mg/dL (ref 0–99)
NonHDL: 108.92
Total CHOL/HDL Ratio: 3
Triglycerides: 102 mg/dL (ref 0.0–149.0)
VLDL: 20.4 mg/dL (ref 0.0–40.0)

## 2022-06-12 LAB — HEPATIC FUNCTION PANEL
ALT: 13 U/L (ref 0–35)
AST: 19 U/L (ref 0–37)
Albumin: 4 g/dL (ref 3.5–5.2)
Alkaline Phosphatase: 53 U/L (ref 39–117)
Bilirubin, Direct: 0.1 mg/dL (ref 0.0–0.3)
Total Bilirubin: 0.4 mg/dL (ref 0.2–1.2)
Total Protein: 7 g/dL (ref 6.0–8.3)

## 2022-06-12 LAB — BASIC METABOLIC PANEL
BUN: 13 mg/dL (ref 6–23)
CO2: 29 mEq/L (ref 19–32)
Calcium: 9.4 mg/dL (ref 8.4–10.5)
Chloride: 102 mEq/L (ref 96–112)
Creatinine, Ser: 0.58 mg/dL (ref 0.40–1.20)
GFR: 99.3 mL/min (ref >60.00)
Glucose, Bld: 93 mg/dL (ref 70–99)
Potassium: 3.6 mEq/L (ref 3.5–5.1)
Sodium: 140 mEq/L (ref 135–145)

## 2022-06-12 LAB — CBC WITH DIFFERENTIAL/PLATELET
Basophils Absolute: 0 10*3/uL (ref 0.0–0.1)
Basophils Relative: 0.6 % (ref 0.0–3.0)
Eosinophils Absolute: 0.1 10*3/uL (ref 0.0–0.7)
Eosinophils Relative: 1.3 % (ref 0.0–5.0)
HCT: 39.4 % (ref 36.0–46.0)
Hemoglobin: 13.1 g/dL (ref 12.0–15.0)
Lymphocytes Relative: 46.8 % — ABNORMAL HIGH (ref 12.0–46.0)
Lymphs Abs: 3.9 10*3/uL (ref 0.7–4.0)
MCHC: 33.3 g/dL (ref 30.0–36.0)
MCV: 93 fl (ref 78.0–100.0)
Monocytes Absolute: 0.6 10*3/uL (ref 0.1–1.0)
Monocytes Relative: 7.6 % (ref 3.0–12.0)
Neutro Abs: 3.6 10*3/uL (ref 1.4–7.7)
Neutrophils Relative %: 43.7 % (ref 43.0–77.0)
Platelets: 195 10*3/uL (ref 150.0–400.0)
RBC: 4.24 Mil/uL (ref 3.87–5.11)
RDW: 13.1 % (ref 11.5–15.5)
WBC: 8.3 10*3/uL (ref 4.0–10.5)

## 2022-06-12 LAB — TSH: TSH: 2.28 u[IU]/mL (ref 0.35–5.50)

## 2022-06-12 LAB — VITAMIN D 25 HYDROXY (VIT D DEFICIENCY, FRACTURES): VITD: 18.1 ng/mL — ABNORMAL LOW (ref 30.00–100.00)

## 2022-06-12 LAB — HEMOGLOBIN A1C: Hgb A1c MFr Bld: 6.1 % (ref 4.6–6.5)

## 2022-06-12 NOTE — Progress Notes (Signed)
   Subjective:    Patient ID: Linda Kerr, female    DOB: 1960-12-01, 61 y.o.   MRN: [id number]  HPI CPE- UTD on mammo, colonoscopy, pap, Tdap.  No concerns today  Patient Care Team    Relationship Specialty Notifications Start End  Maximiano Coss, NP (Inactive) PCP - General Adult Health Nurse Practitioner  04/03/21   Bobbye Charleston, MD Consulting Physician Obstetrics and Gynecology  12/06/16   Purnell Shoemaker., MD Attending Physician Psychiatry  12/13/16     Health Maintenance  Topic Date Due   Hepatitis C Screening  Never done   INFLUENZA VACCINE  05/29/2022   COVID-19 Vaccine (2 - Pfizer risk series) 06/28/2022 (Originally 10/28/2020)   Zoster Vaccines- Shingrix (1 of 2) 10/17/2022 (Originally 05/19/1982)   MAMMOGRAM  03/14/2024   COLONOSCOPY (Pts 45-65yr Insurance coverage will need to be confirmed)  05/03/2024   PAP SMEAR-Modifier  03/14/2025   TETANUS/TDAP  01/14/2028   HIV Screening  Completed   HPV VACCINES  Aged Out      Review of Systems Patient reports no vision/ hearing changes, adenopathy,fever, weight change,  persistant/recurrent hoarseness , swallowing issues, chest pain, palpitations, edema, persistant/recurrent cough, hemoptysis, dyspnea (rest/exertional/paroxysmal nocturnal), gastrointestinal bleeding (melena, rectal bleeding), abdominal pain, significant heartburn, bowel changes, GU symptoms (dysuria, hematuria, incontinence), Gyn symptoms (abnormal  bleeding, pain),  syncope, focal weakness, memory loss, numbness & tingling, skin/hair/nail changes, abnormal bruising or bleeding, anxiety, or depression.     Objective:   Physical Exam General Appearance:    Alert, cooperative, no distress, appears stated age, obese  Head:    Normocephalic, without obvious abnormality, atraumatic  Eyes:    PERRL, conjunctiva/corneas clear, EOM's intact both eyes  Ears:    Normal TM's and external ear canals, both ears  Nose:   Nares normal, septum midline, mucosa normal, no  drainage    or sinus tenderness  Throat:   Lips, mucosa, and tongue normal; teeth and gums normal  Neck:   Supple, symmetrical, trachea midline, no adenopathy;    Thyroid: no enlargement/tenderness/nodules  Back:     Symmetric, no curvature, ROM normal, no CVA tenderness  Lungs:     Clear to auscultation bilaterally, respirations unlabored  Chest Wall:    No tenderness or deformity   Heart:    Regular rate and rhythm, S1 and S2 normal, no murmur, rub   or gallop  Breast Exam:    Deferred to GYN  Abdomen:     Soft, non-tender, bowel sounds active all four quadrants,    no masses, no organomegaly  Genitalia:    Deferred to GYN  Rectal:    Extremities:   Extremities normal, atraumatic, no cyanosis or edema  Pulses:   2+ and symmetric all extremities  Skin:   Skin color, texture, turgor normal, no rashes or lesions  Lymph nodes:   Cervical, supraclavicular, and axillary nodes normal  Neurologic:   CNII-XII intact, normal strength, sensation and reflexes    throughout          Assessment & Plan:

## 2022-06-12 NOTE — Telephone Encounter (Signed)
Pt is requesting a TOC from NP Morrow to Dr. Volanda Napoleon.  Pt has BC[employer] and [employer].  Please let me know if this okay.  Please advise.

## 2022-06-12 NOTE — Assessment & Plan Note (Signed)
Chronic problem.  Adequate control.  Currently asymptomatic.  Check labs but no anticipated med changes

## 2022-06-12 NOTE — Assessment & Plan Note (Signed)
Pt's PE WNL w/ exception of obesity.  UTD on pap, mammo, colonoscopy, Tdap.  Check labs.  Anticipatory guidance provided.  

## 2022-06-12 NOTE — Assessment & Plan Note (Signed)
Ongoing issue for pt.  BMI 33.  She was not approved for Ozempic.  Encouraged her to discuss w/ insurance.  Check labs to risk stratify.  Will follow.

## 2022-06-12 NOTE — Patient Instructions (Signed)
Schedule an appt with your new provider so you can have your 6 month follow up We'll notify you of your lab results and make any changes if needed Continue to work on healthy diet and regular exercise- you can do it!! Call with any questions or concerns Stay Safe!  Stay Healthy! Good luck with back to school!!!

## 2022-06-13 ENCOUNTER — Other Ambulatory Visit: Payer: Self-pay

## 2022-06-13 ENCOUNTER — Telehealth: Payer: Self-pay

## 2022-06-13 LAB — HEPATITIS C ANTIBODY: Hepatitis C Ab: NONREACTIVE

## 2022-06-13 MED ORDER — VITAMIN D (ERGOCALCIFEROL) 1.25 MG (50000 UNIT) PO CAPS: 50000.0000 [IU] | ORAL_CAPSULE | ORAL | 12 refills | Status: DC

## 2022-06-13 NOTE — Progress Notes (Signed)
Left pt a VM in regards to lab results

## 2022-06-13 NOTE — Telephone Encounter (Signed)
PA has been submitted thru Cover my meds for pt Rx Ozempic . Waiting on response

## 2022-06-14 NOTE — Telephone Encounter (Signed)
Ok

## 2022-06-18 NOTE — Telephone Encounter (Signed)
Pt scheduled for TOC on 07/05/2022

## 2022-06-20 NOTE — Telephone Encounter (Signed)
Pt scheduled for a TOC on 07/05/22.

## 2022-06-27 ENCOUNTER — Other Ambulatory Visit: Payer: Self-pay | Admitting: Psychiatry

## 2022-06-27 DIAGNOSIS — F3181 Bipolar II disorder: Secondary | ICD-10-CM

## 2022-07-05 ENCOUNTER — Ambulatory Visit (INDEPENDENT_AMBULATORY_CARE_PROVIDER_SITE_OTHER): Payer: BC Managed Care – PPO | Admitting: Family Medicine

## 2022-07-05 VITALS — BP 124/60 | HR 68 | Temp 99.2°F | Ht 62.5 in | Wt 179.2 lb

## 2022-07-05 DIAGNOSIS — D863 Sarcoidosis of skin: Secondary | ICD-10-CM

## 2022-07-05 DIAGNOSIS — Z87891 Personal history of nicotine dependence: Secondary | ICD-10-CM | POA: Diagnosis not present

## 2022-07-05 DIAGNOSIS — F3170 Bipolar disorder, currently in remission, most recent episode unspecified: Secondary | ICD-10-CM | POA: Diagnosis not present

## 2022-07-05 DIAGNOSIS — R635 Abnormal weight gain: Secondary | ICD-10-CM

## 2022-07-05 NOTE — Patient Instructions (Addendum)
It was nice meeting you today.  Continue working on making changes to your diet and start using the "treadmill around the corner".

## 2022-07-05 NOTE — Progress Notes (Signed)
Subjective:    Patient ID: Linda Kerr, female    DOB: [birth year], 61 y.o.   MRN: [id number]  Chief Complaint  Patient presents with  . Establish Care    Did see Dr Karrie Doffing, put her on vit D, was doing ozempic but last refill was not approved. Went to derm for face, going to pulm to see about sarcoidosis    HPI Patient is a 61 year old female with PMH SIG for depression, HLD, OSA, sarcoidosis, obesity,was seen today for TOC and follow-up on ongoing concerns.  Past Medical History:  Diagnosis Date  . Allergy   . Anemia   . Anxiety   . Arthritis   . Bipolar disorder ([place])   . Depression   . Hyperlipidemia   . Sleep apnea 07/29/2017    Allergies  Allergen Reactions  . Fish Oil Rash  . Penicillins Rash    ROS General: Denies fever, chills, night sweats, changes in weight, changes in appetite HEENT: Denies headaches, ear pain, changes in vision, rhinorrhea, sore throat CV: Denies CP, palpitations, SOB, orthopnea Pulm: Denies SOB, cough, wheezing GI: Denies abdominal pain, nausea, vomiting, diarrhea, constipation GU: Denies dysuria, hematuria, frequency, vaginal discharge Msk: Denies muscle cramps, joint pains Neuro: Denies weakness, numbness, tingling Skin: Denies rashes, bruising Psych: Denies depression, anxiety, hallucinations     Objective:    Blood pressure 124/60, pulse 68, temperature 99.2 F (37.3 C), temperature source Oral, height 5' 2.5" (1.588 m), weight 179 lb 3.2 oz (81.3 kg).  Gen. Pleasant, well-nourished, in no distress, normal affect   HEENT: [place]/AT, face symmetric, conjunctiva clear, no scleral icterus, PERRLA, EOMI, nares patent without drainage, pharynx without erythema or exudate. Neck: No JVD, no thyromegaly, no carotid bruits Lungs: no accessory muscle use, CTAB, no wheezes or rales Cardiovascular: RRR, no m/r/g, no peripheral edema Abdomen: BS present, soft, NT/ND, no hepatosplenomegaly. Musculoskeletal: No deformities, no cyanosis or clubbing,  normal tone Neuro:  A&Ox3, CN II-XII intact, normal gait Skin:  Warm, no lesions/ rash   Wt Readings from Last 3 Encounters:  06/12/22 180 lb 9.6 oz (81.9 kg)  01/01/22 180 lb (81.6 kg)  12/04/21 181 lb 3.2 oz (82.2 kg)    Lab Results  Component Value Date   WBC 8.3 06/12/2022   HGB 13.1 06/12/2022   HCT 39.4 06/12/2022   PLT 195.0 06/12/2022   GLUCOSE 93 06/12/2022   CHOL 154 06/12/2022   TRIG 102.0 06/12/2022   HDL 45.00 06/12/2022   LDLDIRECT 145.4 07/19/2010   LDLCALC 89 06/12/2022   ALT 13 06/12/2022   AST 19 06/12/2022   NA 140 06/12/2022   K 3.6 06/12/2022   CL 102 06/12/2022   CREATININE 0.58 06/12/2022   BUN 13 06/12/2022   CO2 29 06/12/2022   TSH 2.28 06/12/2022   HGBA1C 6.1 06/12/2022    Assessment/Plan:  No diagnosis found.  F/u ***  Grier Mitts, MD

## 2022-07-09 ENCOUNTER — Encounter: Payer: Self-pay | Admitting: Family Medicine

## 2022-07-10 ENCOUNTER — Other Ambulatory Visit: Payer: Self-pay | Admitting: Family Medicine

## 2022-07-30 ENCOUNTER — Telehealth: Payer: Self-pay

## 2022-07-30 ENCOUNTER — Telehealth: Payer: Self-pay | Admitting: Family Medicine

## 2022-07-30 NOTE — Telephone Encounter (Signed)
Pt is calling and the pharm would like to know why she needs to talk benazepril-hydrochlorthiazide (LOTENSIN HCT) 20-25 MG tablet and hydrochlorothiazide (HYDRODIURIL) 25 MG tablet  [id number] [place]:  [phone] Fax:  [phone]

## 2022-07-30 NOTE — Telephone Encounter (Signed)
--  Caller states she has a bladder infection. She is experiencing frequent urination, pain and burning sensation started today. Caller stated she was going every 10-15 mins, Caller stated that after an energy drink and coffee she noticed the symptoms started, then she drank some water and it hasn't urinated so often as before.  07/25/2022 6:32:11 PM See PCP within [place], RN, Neoma Laming  Comments User: Casilda Carls, RN Date/Time Eilene Ghazi Time): 07/26/2022 9:12:34 AM Caller unable to make it to office within 24 hour outcome so she will go to [facility] today   Referrals REFERRED TO PCP OFFICE  07/30/22 1111 - LVM instructions for pt to call back if needed.

## 2022-07-30 NOTE — Telephone Encounter (Signed)
Linda Kerr got a call from pt stated she is returning your call and want a call back.

## 2022-07-30 NOTE — Telephone Encounter (Signed)
LVM instructions that patient can call back to speak to CMA if needed.

## 2022-07-31 NOTE — Telephone Encounter (Signed)
Pt called back and spoke with front office, stated she did not need help with this.

## 2022-08-01 NOTE — Telephone Encounter (Signed)
Pt called back and spoke with front desk and stated this was no longer going on.

## 2022-08-06 ENCOUNTER — Ambulatory Visit: Payer: BC Managed Care – PPO | Admitting: Psychiatry

## 2022-08-20 NOTE — Telephone Encounter (Signed)
This provider did not prescribe either of the medications for pt as she just recently had a TOC visit in August?.  It appears per chart review that patient was initially on HCTZ 25 mg daily but had continued elevation in BP, so benazepril-hydrochlorothiazide 20-25 mg was started (this is a combination pill that has 2 medications benazepril and hydrochlorothiazide in it).  At that time hydrochlorothiazide 25 mg was discontinued.  It appears that patient's pharmacy sent a refill request for the HCTZ 25 mg tabs, even though this medication was discontinued, and it was inadvertently refilled.  Patient has been taking both medications she can continue until she is seen in clinic to discuss medication adjustments.

## 2022-08-31 ENCOUNTER — Other Ambulatory Visit: Payer: Self-pay | Admitting: Psychiatry

## 2022-08-31 DIAGNOSIS — F3181 Bipolar II disorder: Secondary | ICD-10-CM

## 2022-09-09 ENCOUNTER — Other Ambulatory Visit: Payer: Self-pay | Admitting: Psychiatry

## 2022-09-09 DIAGNOSIS — F411 Generalized anxiety disorder: Secondary | ICD-10-CM

## 2022-09-17 ENCOUNTER — Telehealth (INDEPENDENT_AMBULATORY_CARE_PROVIDER_SITE_OTHER): Payer: BC Managed Care – PPO | Admitting: Family Medicine

## 2022-09-17 ENCOUNTER — Telehealth: Payer: Self-pay

## 2022-09-17 DIAGNOSIS — U071 COVID-19: Secondary | ICD-10-CM

## 2022-09-17 MED ORDER — FLUTICASONE PROPIONATE 50 MCG/ACT NA SUSP: 1.0000 | NASAL | 1 refills | Status: DC | PRN

## 2022-09-17 NOTE — Progress Notes (Signed)
Virtual Visit via Video Note  I connected with Linda Kerr on 09/17/22 at  4:15 PM EST by a video enabled telemedicine application 2/2 [id number] pandemic and verified that I am speaking with the correct person using two identifiers.  Location patient: home Location provider:work or home office Persons participating in the virtual visit: patient, provider  I discussed the limitations of evaluation and management by telemedicine and the availability of in person appointments. The patient expressed understanding and agreed to proceed.  Chief Complaint  Patient presents with   Covid Vaccine     Tested positive on 11/17, just had a scratchy throat, that is now gone. Now having nagging little headaches, and gas. No fever, feels uncomfortable, runny nose has stopped. Feels like a head cold. Symptoms started on fri, but was feeling cold symptoms on thur    HPI: Pt started feeling tired on Thursday.  Pt tested positive on 11/17.  Symptoms on Friday included head cold like symptoms, scratchy throat, rhinorrhea, mild HA, gas, mild cough Denies fever, ear pain/pressure, n/v, diarrhea Taking elderberry, mucinex,  emergen-C, spraying lysol and microban. Pt works around [employer], dispensing.   ROS: See pertinent positives and negatives per HPI.  Past Medical History:  Diagnosis Date   Allergy    Anemia    Anxiety    Arthritis    Bipolar disorder ([place])    Depression    Hyperlipidemia    Sleep apnea 07/29/2017    Past Surgical History:  Procedure Laterality Date   CESAREAN SECTION     SIGMOIDOSCOPY      Family History  Problem Relation Age of Onset   Hypertension Other    Coronary artery disease Other    Colon cancer Other    Diabetes Other    Heart disease Mother    Hypertension Mother    Diabetes Father    Hypertension Father     Current Outpatient Medications:    ALPRAZolam (XANAX) 0.5 MG tablet, Take 1 tablet (0.5 mg total) by mouth 2 (two) times daily as needed for anxiety., Disp: 30 tablet,  Rfl: 1   benazepril-hydrochlorthiazide (LOTENSIN HCT) 20-25 MG tablet, TAKE 1 TABLET BY MOUTH EVERY DAY, Disp: 90 tablet, Rfl: 1   divalproex (DEPAKOTE ER) 250 MG 24 hr tablet, TAKE 3 TABLETS (750 MG TOTAL) BY MOUTH AT BEDTIME., Disp: 180 tablet, Rfl: 0   fluticasone (FLONASE) 50 MCG/ACT nasal spray, as needed., Disp: , Rfl:    hydrochlorothiazide (HYDRODIURIL) 25 MG tablet, TAKE 1 TABLET (25 MG TOTAL) BY MOUTH DAILY. DUE FOR PHYSICAL IN [place], Disp: 90 tablet, Rfl: 1   hydroquinone 4 % cream, Apply topically 2 (two) times daily., Disp: , Rfl:    KLOR-CON M20 20 MEQ tablet, TAKE 1 TABLET (20 MEQ TOTAL) BY MOUTH DAILY. DUE FOR PHYSICAL IN [place], Disp: 90 tablet, Rfl: 1   montelukast (SINGULAIR) 10 MG tablet, TAKE 1 TABLET BY MOUTH EVERYDAY AT BEDTIME, Disp: 90 tablet, Rfl: 3   norethindrone-ethinyl estradiol (FEMHRT 1/5) 1-5 MG-MCG TABS tablet, Take 1 tablet by mouth daily., Disp: , Rfl:    pravastatin (PRAVACHOL) 80 MG tablet, TAKE 1 TABLET BY MOUTH EVERY DAY, Disp: 90 tablet, Rfl: 1   Semaglutide, 2 MG/DOSE, 8 MG/3ML SOPN, Inject 2 mg as directed once a week., Disp: 9 mL, Rfl: 1   sertraline (ZOLOFT) 50 MG tablet, TAKE 1 TABLET BY MOUTH EVERYDAY AT BEDTIME, Disp: 90 tablet, Rfl: 0   Vitamin D, Ergocalciferol, (DRISDOL) 1.25 MG (50000 UNIT) CAPS capsule, Take 1 capsule (  50,000 Units total) by mouth every 7 (seven) days., Disp: 7 capsule, Rfl: 12   zolpidem (AMBIEN) 5 MG tablet, Take 1 tablet (5 mg total) by mouth at bedtime as needed for sleep., Disp: 30 tablet, Rfl: 1   pravastatin (PRAVACHOL) 40 MG tablet, TAKE 1 TABLET BY MOUTH EVERY DAY (Patient not taking: Reported on 07/05/2022), Disp: 90 tablet, Rfl: 1  EXAM:  VITALS per patient if applicable: RR between 36-06 bpm  GENERAL: alert, oriented, appears well and in no acute distress  HEENT: atraumatic, conjunctiva clear, no obvious abnormalities on inspection of external nose and ears  NECK: normal movements of the head and neck  LUNGS:  on inspection no signs of respiratory distress, breathing rate appears normal, no obvious gross SOB, gasping or wheezing  CV: no obvious cyanosis  MS: moves all visible extremities without noticeable abnormality  PSYCH/NEURO: pleasant and cooperative, no obvious depression or anxiety, speech and thought processing grossly intact  ASSESSMENT AND PLAN:  Discussed the following assessment and plan:  COVID-19 virus infection -symptoms starting 09/13/22, positive COVID test on 09/14/22 -symptoms improving -Given mild symptoms and duration of symptoms antiviral not indicated at this time. -continue supportive care with OTC cough/cold medications -Given strict precautions  - Plan: fluticasone (FLONASE) 50 MCG/ACT nasal spray  Follow-up as needed for continued or worsened symptoms   I discussed the assessment and treatment plan with the patient. The patient was provided an opportunity to ask questions and all were answered. The patient agreed with the plan and demonstrated an understanding of the instructions.   The patient was advised to call back or seek an in-person evaluation if the symptoms worsen or if the condition fails to improve as anticipated.  Billie Ruddy, MD

## 2022-09-17 NOTE — Telephone Encounter (Addendum)
--  Caller states she has covid. She has a stuffy nose, scratchy throat and body aches. Denies fever  09/15/2022 7:02:24 [place], RN, Joelene Millin  Comments User: Darrelyn Hillock, RN Date/Time Eilene Ghazi Time): 09/15/2022 6:53:27 PM symptoms started yesterday  User: Darrelyn Hillock, RN Date/Time Eilene Ghazi Time): 09/15/2022 6:55:14 PM Nagging headache  User: Darrelyn Hillock, RN Date/Time Eilene Ghazi Time): 09/15/2022 7:04:49 PM Advised caller that there is no on call available for medications after hours. Did advise that urgent care may be able to assist if caller felt medications needed.  09/17/22 1034 - Pt states she feels like she's getting better. States symptoms started x 3 days ago. Reviewed masking guidelines; pt verb understanding. Pt states she would like prescription for antiviral. Advised that appt needed; VV scheduled with PCP today at 4:15p

## 2022-10-01 ENCOUNTER — Other Ambulatory Visit: Payer: Self-pay | Admitting: Family Medicine

## 2022-10-01 DIAGNOSIS — U071 COVID-19: Secondary | ICD-10-CM

## 2022-10-04 ENCOUNTER — Ambulatory Visit (INDEPENDENT_AMBULATORY_CARE_PROVIDER_SITE_OTHER): Payer: BC Managed Care – PPO | Admitting: Psychiatry

## 2022-10-04 ENCOUNTER — Encounter: Payer: Self-pay | Admitting: Psychiatry

## 2022-10-04 DIAGNOSIS — F411 Generalized anxiety disorder: Secondary | ICD-10-CM

## 2022-10-04 DIAGNOSIS — F3181 Bipolar II disorder: Secondary | ICD-10-CM | POA: Diagnosis not present

## 2022-10-04 MED ORDER — DIVALPROEX SODIUM ER 250 MG PO TB24: 750.0000 mg | ORAL_TABLET | Freq: Every day | ORAL | 3 refills | Status: DC

## 2022-10-04 MED ORDER — SERTRALINE HCL 50 MG PO TABS: 50.0000 mg | ORAL_TABLET | Freq: Every day | ORAL | 3 refills | Status: DC

## 2022-10-04 NOTE — Progress Notes (Signed)
Linda Kerr [id number] 06-22-1961 61 y.o.  Subjective:   Patient ID:  Linda Kerr is a 61 y.o. (DOB 1961/03/29) female.  Chief Complaint:  Chief Complaint  Patient presents with   Follow-up    Bipolar II disorder ([place])   Anxiety    HPI Linda Kerr presents to the office today for follow-up of bipolar disorder and generalized anxiety disorder.  seen 07/2019.  No meds were changed.  08/2020 appt doing well without med changed.  08/07/2021 appointment with the following noted: Sometimes Xanax for anxiety or sleep. Change hormone tablet. Consistent with psych meds Depakote 750, sertraline 50. Doinng well overall.  No persistent depressions. D moved back home. Settled in now.  Otherwise doing well.  Still pleased with meds.  Exercising.   Ambien prn. No SE. Needs to work on weight and working out.  10/04/22 appt noted: OnConsistent with psych meds Depakote 750, sertraline 50. And prn alprazolam as needed. Pretty good.  Nephew drowned fishing.   Mood has been stable without swings.  Exercising.  Managing anxiety with behavioral techniques.  Sleep about like usual. No SE Lost 10#.  Checkups good..  Exercising.    No comments from family. Patient reports stable mood and denies depressed or irritable moods.  Patient reports recent difficulty with anxiety with worry.  In proportion to situation.  Able to function. 1 panic since here.  Patient denies difficulty with sleep initiation or maintenance. Average 5-6 hours but naps in daytime.  Night owl. Bothers H a little.  Denies appetite disturbance.  Patient reports that energy and motivation have been good.  Patient denies any difficulty with concentration.  Patient denies any suicidal ideation.  Past Psychiatric Medication Trials: Alprazolam, Ambien 5 mg awakening, clonazepam, sertraline 75, Depakote ER 750, lithium, citalopram, nefazodone, Wellbutrin agitated She has been maintained on Depakote ER 750 mg nightly with sertraline  50 mg daily for seen in our practice in 2003.  Review of Systems:  Review of Systems  Cardiovascular:  Negative for palpitations.       BP a little of a problem.  Gastrointestinal:  Negative for nausea.  Neurological:  Negative for tremors.    Medications: I have reviewed the patient's current medications.  Current Outpatient Medications  Medication Sig Dispense Refill   ALPRAZolam (XANAX) 0.5 MG tablet Take 1 tablet (0.5 mg total) by mouth 2 (two) times daily as needed for anxiety. 30 tablet 1   benazepril-hydrochlorthiazide (LOTENSIN HCT) 20-25 MG tablet TAKE 1 TABLET BY MOUTH EVERY DAY 90 tablet 1   fluticasone (FLONASE) 50 MCG/ACT nasal spray PLACE 1 SPRAY INTO BOTH NOSTRILS AS NEEDED. 48 mL 1   hydrochlorothiazide (HYDRODIURIL) 25 MG tablet TAKE 1 TABLET (25 MG TOTAL) BY MOUTH DAILY. DUE FOR PHYSICAL IN JANUARY 90 tablet 1   hydroquinone 4 % cream Apply topically 2 (two) times daily.     KLOR-CON M20 20 MEQ tablet TAKE 1 TABLET (20 MEQ TOTAL) BY MOUTH DAILY. DUE FOR PHYSICAL IN JANUARY 90 tablet 1   montelukast (SINGULAIR) 10 MG tablet TAKE 1 TABLET BY MOUTH EVERYDAY AT BEDTIME 90 tablet 3   norethindrone-ethinyl estradiol (FEMHRT 1/5) 1-5 MG-MCG TABS tablet Take 1 tablet by mouth daily.     pravastatin (PRAVACHOL) 40 MG tablet TAKE 1 TABLET BY MOUTH EVERY DAY 90 tablet 1   pravastatin (PRAVACHOL) 80 MG tablet TAKE 1 TABLET BY MOUTH EVERY DAY 90 tablet 1   Semaglutide, 2 MG/DOSE, 8 MG/3ML SOPN Inject 2 mg as directed once  a week. 9 mL 1   Vitamin D, Ergocalciferol, (DRISDOL) 1.25 MG (50000 UNIT) CAPS capsule Take 1 capsule (50,000 Units total) by mouth every 7 (seven) days. 7 capsule 12   zolpidem (AMBIEN) 5 MG tablet Take 1 tablet (5 mg total) by mouth at bedtime as needed for sleep. 30 tablet 1   divalproex (DEPAKOTE ER) 250 MG 24 hr tablet Take 3 tablets (750 mg total) by mouth at bedtime. 180 tablet 3   sertraline (ZOLOFT) 50 MG tablet Take 1 tablet (50 mg total) by mouth at  bedtime. 90 tablet 3   No current facility-administered medications for this visit.    Medication Side Effects: None  Allergies:  Allergies  Allergen Reactions   Fish Oil Rash   Penicillins Rash    Past Medical History:  Diagnosis Date   Allergy    Anemia    Anxiety    Arthritis    Bipolar disorder ([place])    Depression    Hyperlipidemia    Sleep apnea 07/29/2017    Family History  Problem Relation Age of Onset   Hypertension Other    Coronary artery disease Other    Colon cancer Other    Diabetes Other    Heart disease Mother    Hypertension Mother    Diabetes Father    Hypertension Father     Social History   Socioeconomic History   Marital status: Married    Spouse name: Not on file   Number of children: 1   Years of education: Not on file   Highest education level: Not on file  Occupational History   Occupation: SCHOOL COUNSELOR    Employer: [employer]  Tobacco Use   Smoking status: Former    Types: Cigarettes    Quit date: 10/29/1988    Years since quitting: 33.9   Smokeless tobacco: Never  Vaping Use   Vaping Use: Never used  Substance and Sexual Activity   Alcohol use: Yes    Alcohol/week: 0.0 standard drinks of alcohol    Comment: rare   Drug use: No   Sexual activity: Yes  Other Topics Concern   Not on file  Social History Narrative   Not on file   Social Determinants of Health   Financial Resource Strain: Not on file  Food Insecurity: Not on file  Transportation Needs: Not on file  Physical Activity: Not on file  Stress: Not on file  Social Connections: Not on file  Intimate Partner Violence: Not on file    Past Medical History, Surgical history, Social history, and Family history were reviewed and updated as appropriate.   Please see review of systems for further details on the patient's review from today.   Objective:   Physical Exam:  There were no vitals taken for this visit.  Physical Exam Constitutional:       General: She is not in acute distress.    Appearance: She is well-developed.  Musculoskeletal:        General: No deformity.  Neurological:     Mental Status: She is alert and oriented to person, place, and time.     Coordination: Coordination normal.  Psychiatric:        Attention and Perception: Attention and perception normal. She does not perceive auditory or visual hallucinations.        Mood and Affect: Mood is not anxious or depressed. Affect is not labile, blunt, angry or inappropriate.        Speech:  Speech normal. Speech is not slurred.        Behavior: Behavior normal.        Thought Content: Thought content normal. Thought content does not include homicidal or suicidal ideation. Thought content does not include homicidal or suicidal plan.        Cognition and Memory: Cognition and memory normal.        Judgment: Judgment normal.     Comments: Insight intact. No delusions.  Rare anxiety      Lab Review:     Component Value Date/Time   NA 140 06/12/2022 1052   K 3.6 06/12/2022 1052   CL 102 06/12/2022 1052   CO2 29 06/12/2022 1052   GLUCOSE 93 06/12/2022 1052   BUN 13 06/12/2022 1052   CREATININE 0.58 06/12/2022 1052   CREATININE 0.67 10/17/2021 1302   CREATININE 0.60 11/13/2019 1505   CALCIUM 9.4 06/12/2022 1052   PROT 7.0 06/12/2022 1052   ALBUMIN 4.0 06/12/2022 1052   AST 19 06/12/2022 1052   AST 17 10/17/2021 1302   ALT 13 06/12/2022 1052   ALT 12 10/17/2021 1302   ALKPHOS 53 06/12/2022 1052   BILITOT 0.4 06/12/2022 1052   BILITOT 0.4 10/17/2021 1302   GFRNONAA >60 10/17/2021 1302   GFRAA >60 11/25/2019 1456       Component Value Date/Time   WBC 8.3 06/12/2022 1052   RBC 4.24 06/12/2022 1052   HGB 13.1 06/12/2022 1052   HGB 13.3 10/17/2021 1302   HCT 39.4 06/12/2022 1052   PLT 195.0 06/12/2022 1052   PLT 210 10/17/2021 1302   MCV 93.0 06/12/2022 1052   MCH 30.2 10/17/2021 1302   MCHC 33.3 06/12/2022 1052   RDW 13.1 06/12/2022 1052   LYMPHSABS  3.9 06/12/2022 1052   MONOABS 0.6 06/12/2022 1052   EOSABS 0.1 06/12/2022 1052   BASOSABS 0.0 06/12/2022 1052    No results found for: "POCLITH", "LITHIUM"   Lab Results  Component Value Date   VALPROATE 58.2 12/06/2016     .res Assessment: Plan:    Jaslen was seen today for follow-up and anxiety.  Diagnoses and all orders for this visit:  Bipolar II disorder ([place]) -     divalproex (DEPAKOTE ER) 250 MG 24 hr tablet; Take 3 tablets (750 mg total) by mouth at bedtime.  Generalized anxiety disorder -     sertraline (ZOLOFT) 50 MG tablet; Take 1 tablet (50 mg total) by mouth at bedtime.    Disc bipolar and anxiety disorder acurrent meds. Stable with meds for extended time.   Consider vitamin D bc limited sun exposure.   No problems with meds.  No med changes indicated.  Continue Depakote ER 750 mg HS Sertraline 50 mg daily.  OK prn Ambien 5 mg.  Disc risk.  FU 1 year bc stable  Lynder Parents, MD, DFAPA     Please see After Visit Summary for patient specific instructions.  Future Appointments  Date Time Provider [place]  10/05/2022  3:30 PM Billie Ruddy, MD [id number]    No orders of the defined types were placed in this encounter.   -------------------------------

## 2022-10-05 ENCOUNTER — Ambulatory Visit (INDEPENDENT_AMBULATORY_CARE_PROVIDER_SITE_OTHER): Payer: BC Managed Care – PPO | Admitting: Family Medicine

## 2022-10-05 VITALS — BP 146/80 | HR 70 | Temp 98.7°F | Wt 180.6 lb

## 2022-10-05 DIAGNOSIS — E559 Vitamin D deficiency, unspecified: Secondary | ICD-10-CM | POA: Diagnosis not present

## 2022-10-05 DIAGNOSIS — E669 Obesity, unspecified: Secondary | ICD-10-CM

## 2022-10-05 DIAGNOSIS — R7303 Prediabetes: Secondary | ICD-10-CM

## 2022-10-05 DIAGNOSIS — I10 Essential (primary) hypertension: Secondary | ICD-10-CM | POA: Diagnosis not present

## 2022-10-05 MED ORDER — SEMAGLUTIDE-WEIGHT MANAGEMENT 0.25 MG/0.5ML ~~LOC~~ SOAJ: 0.2500 mg | SUBCUTANEOUS | 0 refills | Status: AC

## 2022-10-05 MED ORDER — SEMAGLUTIDE-WEIGHT MANAGEMENT 2.4 MG/0.75ML ~~LOC~~ SOAJ: 2.4000 mg | SUBCUTANEOUS | 0 refills | Status: AC

## 2022-10-05 MED ORDER — SEMAGLUTIDE-WEIGHT MANAGEMENT 0.5 MG/0.5ML ~~LOC~~ SOAJ: 0.5000 mg | SUBCUTANEOUS | 0 refills | Status: AC

## 2022-10-05 MED ORDER — SEMAGLUTIDE-WEIGHT MANAGEMENT 1.7 MG/0.75ML ~~LOC~~ SOAJ: 1.7000 mg | SUBCUTANEOUS | 0 refills | Status: AC

## 2022-10-05 MED ORDER — SEMAGLUTIDE-WEIGHT MANAGEMENT 1 MG/0.5ML ~~LOC~~ SOAJ: 1.0000 mg | SUBCUTANEOUS | 0 refills | Status: AC

## 2022-10-05 NOTE — Patient Instructions (Signed)
It looks like you had a physical on 06/12/2022.    A prescription for wegovy was sent to your pharmacy.  The prescription was written for the next few month with the dose increasing each month.  We will have you follow up in the next 6-7 weeks to see how things are going.

## 2022-10-05 NOTE — Progress Notes (Signed)
Subjective:    Patient ID: Linda Kerr, female    DOB: 1960/11/12, 61 y.o.   MRN: [id number]  Chief Complaint  Patient presents with   Follow-up    HPI Patient was seen today for f/u.  Initially scheduled as CPE, however had in Aug 2023.  Pt states she is doing well.  Interested in weight loss medication.  Has history of prediabetes.  Hemoglobin A1c was 6.1% on 06/12/2022.  Working on diet and exercise.  BP typically controlled.  Benazepril-HCTZ 20-25 mg.  Past Medical History:  Diagnosis Date   Allergy    Anemia    Anxiety    Arthritis    Bipolar disorder ([place])    Depression    Hyperlipidemia    Sleep apnea 07/29/2017    Allergies  Allergen Reactions   Fish Oil Rash   Penicillins Rash    ROS General: Denies fever, chills, night sweats, changes in weight, changes in appetite HEENT: Denies headaches, ear pain, changes in vision, rhinorrhea, sore throat CV: Denies CP, palpitations, SOB, orthopnea Pulm: Denies SOB, cough, wheezing GI: Denies abdominal pain, nausea, vomiting, diarrhea, constipation GU: Denies dysuria, hematuria, frequency, vaginal discharge Msk: Denies muscle cramps, joint pains Neuro: Denies weakness, numbness, tingling Skin: Denies rashes, bruising Psych: Denies depression, anxiety, hallucinations    Objective:    Blood pressure (!) 146/80, pulse 70, temperature 98.7 F (37.1 C), temperature source Oral, weight 180 lb 9.6 oz (81.9 kg), SpO2 99 %.Body mass index is 32.51 kg/m.  Gen. Pleasant, well-nourished, in no distress, normal affect   HEENT: [place]/AT, face symmetric, conjunctiva clear, no scleral icterus, PERRLA, EOMI, nares patent without drainage, pharynx without erythema or exudate. Lungs: no accessory muscle use, CTAB, no wheezes or rales Cardiovascular: RRR, no m/r/g, no peripheral edema Musculoskeletal: No deformities, no cyanosis or clubbing, normal tone Neuro:  A&Ox3, CN II-XII intact, normal gait Skin:  Warm, no lesions/ rash   Wt  Readings from Last 3 Encounters:  10/05/22 180 lb 9.6 oz (81.9 kg)  07/05/22 179 lb 3.2 oz (81.3 kg)  06/12/22 180 lb 9.6 oz (81.9 kg)    Lab Results  Component Value Date   WBC 8.3 06/12/2022   HGB 13.1 06/12/2022   HCT 39.4 06/12/2022   PLT 195.0 06/12/2022   GLUCOSE 93 06/12/2022   CHOL 154 06/12/2022   TRIG 102.0 06/12/2022   HDL 45.00 06/12/2022   LDLDIRECT 145.4 07/19/2010   LDLCALC 89 06/12/2022   ALT 13 06/12/2022   AST 19 06/12/2022   NA 140 06/12/2022   K 3.6 06/12/2022   CL 102 06/12/2022   CREATININE 0.58 06/12/2022   BUN 13 06/12/2022   CO2 29 06/12/2022   TSH 2.28 06/12/2022   HGBA1C 6.1 06/12/2022    Assessment/Plan:  Prediabetes -Hgb A1C 6.1% -lifestyle modifications  Essential hypertension -elevated -recheck  -continue lifestyle modifications -continue benazepril-HCTZ   Obesity (BMI 30-39.9) -Body mass index is 32.51 kg/m. -continue lifestyle modifications  -start semaglutide 0.25 mg wkly x 4 wks  - Plan: Semaglutide-Weight Management 0.25 MG/0.5ML SOAJ, Semaglutide-Weight Management 0.5 MG/0.5ML SOAJ, Semaglutide-Weight Management 1 MG/0.5ML SOAJ, Semaglutide-Weight Management 1.7 MG/0.75ML SOAJ, Semaglutide-Weight Management 2.4 MG/0.75ML SOAJ  Vitamin D deficiency -vit D 18.10 on 06/12/22  F/u in 2-3 months  Grier Mitts, MD

## 2022-10-17 ENCOUNTER — Encounter: Payer: Self-pay | Admitting: Family Medicine

## 2022-10-23 ENCOUNTER — Other Ambulatory Visit: Payer: Self-pay

## 2022-10-23 DIAGNOSIS — I10 Essential (primary) hypertension: Secondary | ICD-10-CM

## 2022-10-23 MED ORDER — POTASSIUM CHLORIDE CRYS ER 20 MEQ PO TBCR: EXTENDED_RELEASE_TABLET | ORAL | 1 refills | Status: DC

## 2022-10-24 ENCOUNTER — Other Ambulatory Visit: Payer: Self-pay

## 2022-10-24 DIAGNOSIS — I10 Essential (primary) hypertension: Secondary | ICD-10-CM

## 2022-10-24 MED ORDER — BENAZEPRIL-HYDROCHLOROTHIAZIDE 20-25 MG PO TABS: 1.0000 | ORAL_TABLET | Freq: Every day | ORAL | 1 refills | Status: DC

## 2022-11-16 ENCOUNTER — Encounter: Payer: Self-pay | Admitting: Family Medicine

## 2022-11-16 ENCOUNTER — Ambulatory Visit (INDEPENDENT_AMBULATORY_CARE_PROVIDER_SITE_OTHER): Payer: BC Managed Care – PPO | Admitting: Family Medicine

## 2022-11-16 VITALS — BP 158/84 | HR 73 | Temp 98.4°F | Ht 62.5 in | Wt 185.2 lb

## 2022-11-16 DIAGNOSIS — R7303 Prediabetes: Secondary | ICD-10-CM | POA: Diagnosis not present

## 2022-11-16 DIAGNOSIS — I10 Essential (primary) hypertension: Secondary | ICD-10-CM

## 2022-11-16 DIAGNOSIS — E669 Obesity, unspecified: Secondary | ICD-10-CM

## 2022-11-16 DIAGNOSIS — F3181 Bipolar II disorder: Secondary | ICD-10-CM | POA: Diagnosis not present

## 2022-11-16 LAB — POCT GLYCOSYLATED HEMOGLOBIN (HGB A1C): Hemoglobin A1C: 5.5 % (ref 4.0–5.6)

## 2022-11-16 NOTE — Progress Notes (Signed)
Established Patient Office Visit   Subjective  Patient ID: Linda Kerr, female    DOB: [birth year]  Age: 62 y.o. MRN: [id number]  Chief Complaint  Patient presents with   Medical Management of Chronic Issues    Follow up on Bp and prediabetes.     Patient seen for follow-up.  Was unable to obtain [facility] for weight loss as it was not covered by her insurance and out of stock times months.  Patient states prescription cost over a thousand dollars.  Pt endorses weight gain over the holidays.  Currently on a cleanse.  Exercising 3 times per week.  Drinking a protein shake in the morning, eating salads.  In the past phentermine caused hair loss.  Patient endorses being sensitive to medications.  Patient states she has not had to take anything for sleep.        Review of Systems  Constitutional:  Negative for weight loss.       Weight gain   Negative unless stated above    Objective:     BP (!) 158/84 (BP Location: Right Arm, Cuff Size: Normal)   Pulse 73   Temp 98.4 F (36.9 C) (Oral)   Ht 5' 2.5" (1.588 m)   Wt 185 lb 3.2 oz (84 kg)   SpO2 98%   BMI 33.33 kg/m    Physical Exam Constitutional:      Appearance: Normal appearance.  HENT:     Head: Normocephalic and atraumatic.     Nose: Nose normal.     Mouth/Throat:     Mouth: Mucous membranes are moist.  Eyes:     Extraocular Movements: Extraocular movements intact.     Conjunctiva/sclera: Conjunctivae normal.     Pupils: Pupils are equal, round, and reactive to light.  Cardiovascular:     Rate and Rhythm: Normal rate and regular rhythm.  Pulmonary:     Effort: Pulmonary effort is normal.     Breath sounds: Normal breath sounds.  Skin:    General: Skin is warm and dry.  Neurological:     Mental Status: She is alert and oriented to person, place, and time. Mental status is at baseline.      Results for orders placed or performed in visit on 11/16/22  POC HgB A1c  Result Value Ref Range   Hemoglobin  A1C 5.5 4.0 - 5.6 %   HbA1c POC (<> result, manual entry)     HbA1c, POC (prediabetic range)     HbA1c, POC (controlled diabetic range)        Assessment & Plan:  Essential hypertension -Elevated -Discussed lifestyle modifications -Continue benazepril-hydrochlorothiazide 20-25 mg daily -Patient encouraged to monitor BP at home.  For continued elevation consistently greater than 140/90 increase benazepril  Obesity (BMI 30-39.9) -Body mass index is 33.33 kg/m. -Unable to obtain [facility] from pharmacy 2/2 shortage and cost -Pt consider transferring prescription to a different pharmacy to see if available.  Also consider asking insurance company what is preferred weight loss medication. -Continue lifestyle modifications -Referral to weight management offered.  Patient declines at this time.  Prediabetes -Hemoglobin A1c 6.1% on 06/12/2022 -Lifestyle modifications -Hemoglobin A1c 5.5% this visit. -     POCT glycosylated hemoglobin (Hb A1C)  Bipolar II disorder ([facility]) -Stable -Continue current medications including Depakote ER 750 mg nightly, Zoloft 50 mg nightly, and Xanax 0.5 mg as needed -Continue follow-up with psychiatry, Dr. Clovis Pu    Return if symptoms worsen or fail to improve.   Larene Beach  Merlene Laughter, MD

## 2023-02-11 ENCOUNTER — Telehealth: Payer: Self-pay | Admitting: Family Medicine

## 2023-02-11 DIAGNOSIS — I10 Essential (primary) hypertension: Secondary | ICD-10-CM

## 2023-02-11 DIAGNOSIS — E782 Mixed hyperlipidemia: Secondary | ICD-10-CM

## 2023-02-11 NOTE — Telephone Encounter (Signed)
Pt stated she have been out for a few day's/mm

## 2023-02-11 NOTE — Telephone Encounter (Signed)
Prescription Request  02/11/2023  LOV: 11/16/2022  What is the name of the medication or equipment? pravastatin  Have you contacted your pharmacy to request a refill? Yes   Which pharmacy would you like this sent to?  [id number] [place], [place] - [address] [place] [address] Phone: [phone] Fax: [phone]    Patient notified that their request is being sent to the clinical staff for review and that they should receive a response within 2 business days.   Please advise at Mobile [phone] (mobile)

## 2023-02-12 MED ORDER — PRAVASTATIN SODIUM 80 MG PO TABS: 80.0000 mg | ORAL_TABLET | Freq: Every day | ORAL | 1 refills | Status: DC

## 2023-02-12 NOTE — Telephone Encounter (Signed)
Refill sent to requested pharmacy.

## 2023-02-14 ENCOUNTER — Telehealth: Payer: Self-pay | Admitting: Family Medicine

## 2023-02-14 NOTE — Telephone Encounter (Signed)
Calling to request a refill of montelukast (SINGULAIR) 10 MG tablet, says pharmacy is asking for a PA

## 2023-02-18 ENCOUNTER — Other Ambulatory Visit (HOSPITAL_COMMUNITY): Payer: Self-pay

## 2023-02-18 NOTE — Telephone Encounter (Signed)
Ran test claim, came back refill. Last filled 01/01/23, next fill 03/10/23. No PA needed

## 2023-02-19 NOTE — Telephone Encounter (Signed)
Noted  

## 2023-03-28 ENCOUNTER — Other Ambulatory Visit: Payer: Self-pay | Admitting: Family Medicine

## 2023-03-28 ENCOUNTER — Other Ambulatory Visit: Payer: Self-pay | Admitting: Psychiatry

## 2023-03-28 DIAGNOSIS — I10 Essential (primary) hypertension: Secondary | ICD-10-CM

## 2023-03-28 DIAGNOSIS — F3181 Bipolar II disorder: Secondary | ICD-10-CM

## 2023-05-17 ENCOUNTER — Ambulatory Visit: Payer: BC Managed Care – PPO | Admitting: Family Medicine

## 2023-05-20 ENCOUNTER — Ambulatory Visit (INDEPENDENT_AMBULATORY_CARE_PROVIDER_SITE_OTHER): Payer: BC Managed Care – PPO | Admitting: Family Medicine

## 2023-05-20 ENCOUNTER — Encounter: Payer: Self-pay | Admitting: Family Medicine

## 2023-05-20 VITALS — BP 130/64 | HR 79 | Temp 99.6°F | Wt 184.0 lb

## 2023-05-20 DIAGNOSIS — D869 Sarcoidosis, unspecified: Secondary | ICD-10-CM

## 2023-05-20 DIAGNOSIS — I10 Essential (primary) hypertension: Secondary | ICD-10-CM | POA: Diagnosis not present

## 2023-05-20 DIAGNOSIS — R609 Edema, unspecified: Secondary | ICD-10-CM | POA: Diagnosis not present

## 2023-05-20 DIAGNOSIS — E669 Obesity, unspecified: Secondary | ICD-10-CM

## 2023-05-20 NOTE — Progress Notes (Signed)
Established Patient Office Visit   Subjective  Patient ID: Linda Kerr, female    DOB: February 23, 1961  Age: 62 y.o. MRN: [id number]  Chief Complaint  Patient presents with   Medical Management of Chronic Issues    6 month f/u.has noticed some water retention. States it is not a lot, but is noticeable. Feels it may be her medication    Patient is a 62 year old female seen for follow-up.  Patient states BP up-and-down at times.  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  Pt states otherwise bp is 120s-130s/80s.  Pt has noticed some fluid retention.  Taking Benazepril-hydrochlorothiazide 20-25 mg daily.  In the past was on 2 of the pills daily.  Pt exercising 4 times per week.  Decreasing sodium intake.  Drinking 8 bottles of water per day.  Feels like urinating more frequently at night.  Denies drinking caffeine late in the day.  Since improving diet patient has noticed more regular BMs.  Sarcoidosis is in remission.  Followed by dermatology.    Past Medical History:  Diagnosis Date   Allergy    Anemia    Anxiety    Arthritis    Bipolar disorder ([facility])    Depression    Hyperlipidemia    Sleep apnea 07/29/2017   Past Surgical History:  Procedure Laterality Date   CESAREAN SECTION     SIGMOIDOSCOPY     Social History   Tobacco Use   Smoking status: Former    Current packs/day: 0.00    Types: Cigarettes    Quit date: 10/29/1988    Years since quitting: 34.5   Smokeless tobacco: Never  Vaping Use   Vaping status: Never Used  Substance Use Topics   Alcohol use: Yes    Alcohol/week: 0.0 standard drinks of alcohol    Comment: rare   Drug use: No   Family History  Problem Relation Age of Onset   Hypertension Other    Coronary artery disease Other    Colon cancer Other    Diabetes Other    Heart disease Mother    Hypertension Mother    Diabetes Father    Hypertension Father    Allergies  Allergen Reactions   Fish Oil Rash    Penicillins Rash      ROS Negative unless stated above    Objective:     BP 130/64 (BP Location: Right Arm, Patient Position: Sitting, Cuff Size: Normal)   Pulse 79   Temp 99.6 F (37.6 C) (Oral)   Wt 184 lb (83.5 kg)   SpO2 95%   BMI 33.12 kg/m  BP Readings from Last 3 Encounters:  05/20/23 130/64  11/16/22 (!) 158/84  10/05/22 (!) 146/80   Wt Readings from Last 3 Encounters:  05/20/23 184 lb (83.5 kg)  11/16/22 185 lb 3.2 oz (84 kg)  10/05/22 180 lb 9.6 oz (81.9 kg)      Physical Exam Constitutional:      General: She is not in acute distress.    Appearance: Normal appearance.  HENT:     Head: Normocephalic and atraumatic.     Nose: Nose normal.     Mouth/Throat:     Mouth: Mucous membranes are moist.  Cardiovascular:     Rate and Rhythm: Normal rate and regular rhythm.     Heart sounds: Normal heart sounds. No murmur heard.    No gallop.     Comments: No pitting edema b/l.  B/l hands with mild nonpitting edema. Pulmonary:     Effort: Pulmonary effort is normal. No respiratory distress.     Breath sounds: Normal breath sounds. No wheezing, rhonchi or rales.  Skin:    General: Skin is warm and dry.  Neurological:     Mental Status: She is alert and oriented to person, place, and time.    No results found for any visits on 05/20/23.    Assessment & Plan:  Essential hypertension  Water retention  Sarcoidosis  Obesity (BMI 30-39.9)   BP controlled in clinic.  Intermittent elevations likely 2/2 stress and diet.  Discussed increasing BP meds.  Patient declines at this time wishes to continue with lifestyle modifications including diet and exercise changes.  Will have patient reduce intake of water by 1 or 2 bottles per day to see if she notices improvement in water retention.  Medications may also be contributing.  Continue taking hydrochlorothiazide 25 mg daily in combination pill for BP.  Sarcoidosis in remission.  Continue follow-up with dermatology  and pulmonology as needed.  BMI 33.21.  Continue lifestyle modifications  Return in about 4 months (around 09/20/2023) for HTN.   Deeann Saint, MD

## 2023-05-22 ENCOUNTER — Other Ambulatory Visit: Payer: Self-pay | Admitting: Family Medicine

## 2023-05-22 DIAGNOSIS — I10 Essential (primary) hypertension: Secondary | ICD-10-CM

## 2023-05-22 DIAGNOSIS — E782 Mixed hyperlipidemia: Secondary | ICD-10-CM

## 2023-07-09 ENCOUNTER — Other Ambulatory Visit: Payer: Self-pay

## 2023-10-03 ENCOUNTER — Ambulatory Visit: Payer: BC Managed Care – PPO | Admitting: Psychiatry

## 2023-10-03 ENCOUNTER — Encounter: Payer: Self-pay | Admitting: Psychiatry

## 2023-10-03 DIAGNOSIS — F411 Generalized anxiety disorder: Secondary | ICD-10-CM | POA: Diagnosis not present

## 2023-10-03 DIAGNOSIS — F3181 Bipolar II disorder: Secondary | ICD-10-CM | POA: Diagnosis not present

## 2023-10-03 MED ORDER — SERTRALINE HCL 50 MG PO TABS: 50.0000 mg | ORAL_TABLET | Freq: Every day | ORAL | 3 refills | Status: DC

## 2023-10-03 MED ORDER — DIVALPROEX SODIUM ER 250 MG PO TB24: 750.0000 mg | ORAL_TABLET | Freq: Every day | ORAL | 3 refills | Status: DC

## 2023-10-03 NOTE — Progress Notes (Signed)
Linda Kerr [id number] 1961-02-13 62 y.o.  Subjective:   Patient ID:  Linda Kerr is a 62 y.o. (DOB 04/19/1961) female.  Chief Complaint:  Chief Complaint  Patient presents with   Follow-up    HPI Linda Kerr presents to the office today for follow-up of bipolar disorder and generalized anxiety disorder.  seen 07/2019.  No meds were changed.  08/2020 appt doing well without med changed.  08/07/2021 appointment with the following noted: Sometimes Xanax for anxiety or sleep. Change hormone tablet. Consistent with psych meds Depakote 750, sertraline 50. Doinng well overall.  No persistent depressions. D moved back home. Settled in now.  Otherwise doing well.  Still pleased with meds.  Exercising.   Ambien prn. No SE. Needs to work on weight and working out.  10/04/22 appt noted: OnConsistent with psych meds Depakote 750, sertraline 50. And prn alprazolam as needed. Pretty good.  Nephew drowned fishing.   Mood has been stable without swings.  Exercising.  Managing anxiety with behavioral techniques.  Sleep about like usual. No SE Lost 10#.  Checkups good..  Exercising.   No comments from family. Patient reports stable mood and denies depressed or irritable moods.  Patient reports recent difficulty with anxiety with worry.  In proportion to situation.  Able to function. 1 panic since here.  Patient denies difficulty with sleep initiation or maintenance. Average 5-6 hours but naps in daytime.  Night owl. Bothers H a little.  Denies appetite disturbance.  Patient reports that energy and motivation have been good.  Patient denies any difficulty with concentration.  Patient denies any suicidal ideation. Plan no changes  10/03/23 appt noted: Holding on fine.  Mood pretty stable.  No sig dep.  Some down with holidays.   Meds as above without SE.  No longer Ambien.  Used prn. Sometimes anxiety but manages it.   Sleep is pretty good.   No new concerns.    Health is stable.  Would  like to lose wt.  Working out and still can't lose wt.   Questions about wt and meds and GLP-1.    Past Psychiatric Medication Trials: Alprazolam, Ambien 5 mg awakening, clonazepam, sertraline 75, Depakote ER 750, lithium, citalopram, nefazodone, Wellbutrin agitated She has been maintained on Depakote ER 750 mg nightly with sertraline 50 mg daily for seen in our practice in 2003.  Review of Systems:  Review of Systems  Cardiovascular:  Negative for palpitations.       BP a little of a problem.  Gastrointestinal:  Negative for nausea.  Neurological:  Negative for tremors.  Psychiatric/Behavioral:  Negative for dysphoric mood and hallucinations.     Medications: I have reviewed the patient's current medications.  Current Outpatient Medications  Medication Sig Dispense Refill   ALPRAZolam (XANAX) 0.5 MG tablet Take 1 tablet (0.5 mg total) by mouth 2 (two) times daily as needed for anxiety. 30 tablet 1   benazepril-hydrochlorthiazide (LOTENSIN HCT) 20-25 MG tablet TAKE 1 TABLET BY MOUTH EVERY DAY 90 tablet 1   montelukast (SINGULAIR) 10 MG tablet Take 10 mg by mouth at bedtime.     norethindrone-ethinyl estradiol (FEMHRT 1/5) 1-5 MG-MCG TABS tablet Take 1 tablet by mouth daily.     potassium chloride SA (KLOR-CON M20) 20 MEQ tablet TAKE 1 TABLET (20 MEQ TOTAL) BY MOUTH DAILY. DUE FOR PHYSICAL IN JANUARY 90 tablet 1   pravastatin (PRAVACHOL) 80 MG tablet TAKE 1 TABLET BY MOUTH EVERY DAY 90 tablet 1   divalproex (DEPAKOTE  ER) 250 MG 24 hr tablet Take 3 tablets (750 mg total) by mouth at bedtime. 270 tablet 3   sertraline (ZOLOFT) 50 MG tablet Take 1 tablet (50 mg total) by mouth at bedtime. 90 tablet 3   zolpidem (AMBIEN) 5 MG tablet Take 1 tablet (5 mg total) by mouth at bedtime as needed for sleep. (Patient not taking: Reported on 10/03/2023) 30 tablet 1   No current facility-administered medications for this visit.    Medication Side Effects: None  Allergies:  Allergies  Allergen  Reactions   Fish Oil Rash   Penicillins Rash    Past Medical History:  Diagnosis Date   Allergy    Anemia    Anxiety    Arthritis    Bipolar disorder (HCC)    Depression    Hyperlipidemia    Sleep apnea 07/29/2017    Family History  Problem Relation Age of Onset   Hypertension Other    Coronary artery disease Other    Colon cancer Other    Diabetes Other    Heart disease Mother    Hypertension Mother    Diabetes Father    Hypertension Father     Social History   Socioeconomic History   Marital status: Married    Spouse name: Not on file   Number of children: 1   Years of education: Not on file   Highest education level: Master's degree (e.g., MA, MS, MEng, MEd, MSW, MBA)  Occupational History   Occupation: SCHOOL COUNSELOR    Employer: [employer]  Tobacco Use   Smoking status: Former    Current packs/day: 0.00    Types: Cigarettes    Quit date: 10/29/1988    Years since quitting: 34.9   Smokeless tobacco: Never  Vaping Use   Vaping status: Never Used  Substance and Sexual Activity   Alcohol use: Yes    Alcohol/week: 0.0 standard drinks of alcohol    Comment: rare   Drug use: No   Sexual activity: Yes  Other Topics Concern   Not on file  Social History Narrative   Not on file   Social Determinants of Health   Financial Resource Strain: Low Risk  (10/05/2022)   Overall Financial Resource Strain (CARDIA)    Difficulty of Paying Living Expenses: Not hard at all  Food Insecurity: Low Risk  (08/29/2023)   Received from [facility]   Hunger Vital Sign    Worried About Running Out of Food in the Last Year: Never true    Ran Out of Food in the Last Year: Never true  Transportation Needs: No Transportation Needs (08/29/2023)   Received from [facility]    In the past 12 months, has lack of reliable transportation kept you from medical appointments, meetings, work or from getting things needed for daily living? : No  Physical  Activity: Insufficiently Active (10/05/2022)   Exercise Vital Sign    Days of Exercise per Week: 2 days    Minutes of Exercise per Session: 70 min  Stress: No Stress Concern Present (10/05/2022)   [employer] of Occupational Health - Occupational Stress Questionnaire    Feeling of Stress : Only a little  Social Connections: Socially Integrated (10/05/2022)   Social Connection and Isolation Panel [NHANES]    Frequency of Communication with Friends and Family: More than three times a week    Frequency of Social Gatherings with Friends and Family: Once a week    Attends Religious Services:  More than 4 times per year    Active Member of Clubs or Organizations: Yes    Attends Banker Meetings: 1 to 4 times per year    Marital Status: Married  Catering manager Violence: Not on file    Past Medical History, Surgical history, Social history, and Family history were reviewed and updated as appropriate.   Please see review of systems for further details on the patient's review from today.   Objective:   Physical Exam:  There were no vitals taken for this visit.  Physical Exam Constitutional:      General: She is not in acute distress.    Appearance: She is well-developed.  Musculoskeletal:        General: No deformity.  Neurological:     Mental Status: She is alert and oriented to person, place, and time.     Coordination: Coordination normal.  Psychiatric:        Attention and Perception: Attention and perception normal. She does not perceive auditory or visual hallucinations.        Mood and Affect: Mood is not anxious or depressed. Affect is not labile, blunt, angry or inappropriate.        Speech: Speech normal. Speech is not slurred.        Behavior: Behavior normal.        Thought Content: Thought content normal. Thought content does not include homicidal or suicidal ideation. Thought content does not include homicidal or suicidal plan.        Cognition and  Memory: Cognition and memory normal.        Judgment: Judgment normal.     Comments: Insight intact. No delusions.  Rare anxiety manageable.      Lab Review:     Component Value Date/Time   NA 140 06/12/2022 1052   K 3.6 06/12/2022 1052   CL 102 06/12/2022 1052   CO2 29 06/12/2022 1052   GLUCOSE 93 06/12/2022 1052   BUN 13 06/12/2022 1052   CREATININE 0.58 06/12/2022 1052   CREATININE 0.67 10/17/2021 1302   CREATININE 0.60 11/13/2019 1505   CALCIUM 9.4 06/12/2022 1052   PROT 7.0 06/12/2022 1052   ALBUMIN 4.0 06/12/2022 1052   AST 19 06/12/2022 1052   AST 17 10/17/2021 1302   ALT 13 06/12/2022 1052   ALT 12 10/17/2021 1302   ALKPHOS 53 06/12/2022 1052   BILITOT 0.4 06/12/2022 1052   BILITOT 0.4 10/17/2021 1302   GFRNONAA >60 10/17/2021 1302   GFRAA >60 11/25/2019 1456       Component Value Date/Time   WBC 8.3 06/12/2022 1052   RBC 4.24 06/12/2022 1052   HGB 13.1 06/12/2022 1052   HGB 13.3 10/17/2021 1302   HCT 39.4 06/12/2022 1052   PLT 195.0 06/12/2022 1052   PLT 210 10/17/2021 1302   MCV 93.0 06/12/2022 1052   MCH 30.2 10/17/2021 1302   MCHC 33.3 06/12/2022 1052   RDW 13.1 06/12/2022 1052   LYMPHSABS 3.9 06/12/2022 1052   MONOABS 0.6 06/12/2022 1052   EOSABS 0.1 06/12/2022 1052   BASOSABS 0.0 06/12/2022 1052    No results found for: "POCLITH", "LITHIUM"   Lab Results  Component Value Date   VALPROATE 58.2 12/06/2016     .res Assessment: Plan:    [place] was seen today for follow-up.  Diagnoses and all orders for this visit:  Bipolar II disorder (HCC) -     divalproex (DEPAKOTE ER) 250 MG 24 hr tablet; Take 3 tablets (  750 mg total) by mouth at bedtime.  Generalized anxiety disorder -     sertraline (ZOLOFT) 50 MG tablet; Take 1 tablet (50 mg total) by mouth at bedtime.    Disc bipolar and anxiety disorder acurrent meds. Stable with meds for extended time.   Consider vitamin D bc limited sun exposure.   No problems with meds.  No med changes  indicated.  Extensive disc of meds and wt gain and alternatives of CBZ but a lot of DDI, and Vraylar but cost and her med-sensitivity could be an issue.  Disc pros and cons.  And ways to change.  She will try to increase exercise instead for now.  Continue Depakote ER 750 mg HS Sertraline 50 mg daily.  OK prn Ambien 5 mg.  Disc risk.  FU 1 year bc stable  Meredith Staggers, MD, DFAPA     Please see After Visit Summary for patient specific instructions.  No future appointments.   No orders of the defined types were placed in this encounter.   -------------------------------

## 2023-10-16 ENCOUNTER — Other Ambulatory Visit: Payer: Self-pay | Admitting: Family Medicine

## 2023-11-29 ENCOUNTER — Other Ambulatory Visit: Payer: Self-pay | Admitting: Family Medicine

## 2023-11-29 DIAGNOSIS — E782 Mixed hyperlipidemia: Secondary | ICD-10-CM

## 2023-11-29 DIAGNOSIS — I10 Essential (primary) hypertension: Secondary | ICD-10-CM

## 2023-12-03 ENCOUNTER — Other Ambulatory Visit: Payer: Self-pay | Admitting: Family Medicine

## 2023-12-03 DIAGNOSIS — I10 Essential (primary) hypertension: Secondary | ICD-10-CM

## 2023-12-03 DIAGNOSIS — E782 Mixed hyperlipidemia: Secondary | ICD-10-CM

## 2023-12-03 NOTE — Telephone Encounter (Signed)
 Copied from CRM [id number]. Topic: Clinical - Medication Refill >> Dec 03, 2023  3:49 PM Adaysia C wrote: Most Recent Primary Care Visit:  Provider: MERCER KIRSCH R  Department: [facility]  Visit Type: OFFICE VISIT  Date: 05/20/2023  Medication: pravastatin  (PRAVACHOL ) 80 MG tablet  Has the patient contacted their pharmacy? Yes, pharmacy has reached to provider but received no response.  (Agent: If no, request that the patient contact the pharmacy for the refill. If patient does not wish to contact the pharmacy document the reason why and proceed with request.) (Agent: If yes, when and what did the pharmacy advise?)  Is this the correct pharmacy for this prescription? Yes If no, delete pharmacy and type the correct one.  This is the patient's preferred pharmacy:  [id number] - [place], [place] - [address] [place] [address] Phone: [phone] Fax: [phone]   Has the prescription been filled recently? No  Is the patient out of the medication? Yes  Has the patient been seen for an appointment in the last year OR does the patient have an upcoming appointment? Yes  Can we respond through MyChart? Yes  Agent: Please be advised that Rx refills may take up to 3 business days. We ask that you follow-up with your pharmacy.

## 2023-12-04 ENCOUNTER — Telehealth: Payer: Self-pay

## 2023-12-04 ENCOUNTER — Other Ambulatory Visit (HOSPITAL_COMMUNITY): Payer: Self-pay

## 2023-12-04 NOTE — Telephone Encounter (Signed)
 Copied from CRM [id number]. Topic: Clinical - Prescription Issue >> Dec 04, 2023  9:25 AM Linda Kerr wrote: Reason for CRM: Pt stated that her pharmacy is needing an approval from the pt PCP for medication pravastatin  (PRAVACHOL ) 80 MG tablet

## 2023-12-04 NOTE — Telephone Encounter (Signed)
 Pharmacy Patient Advocate Encounter   Received notification from Pt Calls Messages that prior authorization for PRAVASTATIN  80MG  is required/requested.   Insurance verification completed.   The patient is insured through [facility] .   Per test claim: The current 30 day co-pay is, $0.  No PA needed at this time. This test claim was processed through [facility]- copay amounts may vary at other pharmacies due to pharmacy/plan contracts, or as the patient moves through the different stages of their insurance plan.

## 2023-12-04 NOTE — Telephone Encounter (Signed)
 Per test claim: The current 30 day co-pay is, $0.  No PA needed at this time. This test claim was processed through [facility]- copay amounts may vary at other pharmacies due to pharmacy/plan contracts, or as the patient moves through the different stages of their insurance plan.

## 2023-12-08 ENCOUNTER — Other Ambulatory Visit: Payer: Self-pay | Admitting: Family Medicine

## 2023-12-08 DIAGNOSIS — I10 Essential (primary) hypertension: Secondary | ICD-10-CM

## 2024-01-03 ENCOUNTER — Other Ambulatory Visit: Payer: Self-pay | Admitting: Family Medicine

## 2024-01-03 DIAGNOSIS — I10 Essential (primary) hypertension: Secondary | ICD-10-CM

## 2024-01-07 ENCOUNTER — Other Ambulatory Visit: Payer: Self-pay | Admitting: Family Medicine

## 2024-01-07 DIAGNOSIS — I10 Essential (primary) hypertension: Secondary | ICD-10-CM

## 2024-01-15 ENCOUNTER — Encounter: Payer: Self-pay | Admitting: Family Medicine

## 2024-01-15 ENCOUNTER — Ambulatory Visit (INDEPENDENT_AMBULATORY_CARE_PROVIDER_SITE_OTHER): Admitting: Family Medicine

## 2024-01-15 VITALS — BP 120/80 | HR 69 | Temp 99.0°F | Ht 62.5 in | Wt 174.6 lb

## 2024-01-15 DIAGNOSIS — E66811 Obesity, class 1: Secondary | ICD-10-CM | POA: Diagnosis not present

## 2024-01-15 DIAGNOSIS — E782 Mixed hyperlipidemia: Secondary | ICD-10-CM | POA: Diagnosis not present

## 2024-01-15 DIAGNOSIS — Z0000 Encounter for general adult medical examination without abnormal findings: Secondary | ICD-10-CM

## 2024-01-15 DIAGNOSIS — I10 Essential (primary) hypertension: Secondary | ICD-10-CM | POA: Diagnosis not present

## 2024-01-15 DIAGNOSIS — J302 Other seasonal allergic rhinitis: Secondary | ICD-10-CM | POA: Diagnosis not present

## 2024-01-15 DIAGNOSIS — E559 Vitamin D deficiency, unspecified: Secondary | ICD-10-CM

## 2024-01-15 DIAGNOSIS — Z6831 Body mass index (BMI) 31.0-31.9, adult: Secondary | ICD-10-CM

## 2024-01-15 MED ORDER — PRAVASTATIN SODIUM 80 MG PO TABS: 80.0000 mg | ORAL_TABLET | Freq: Every day | ORAL | 3 refills | Status: AC

## 2024-01-15 MED ORDER — MONTELUKAST SODIUM 10 MG PO TABS: 10.0000 mg | ORAL_TABLET | Freq: Every day | ORAL | 3 refills | Status: AC

## 2024-01-15 MED ORDER — POTASSIUM CHLORIDE CRYS ER 20 MEQ PO TBCR: EXTENDED_RELEASE_TABLET | ORAL | 3 refills | Status: AC

## 2024-01-15 MED ORDER — BENAZEPRIL-HYDROCHLOROTHIAZIDE 20-25 MG PO TABS: 1.0000 | ORAL_TABLET | Freq: Every day | ORAL | 3 refills | Status: AC

## 2024-01-15 NOTE — Progress Notes (Signed)
 Established Patient Office Visit   Subjective  Patient ID: Linda Kerr, female    DOB: 04-Aug-1961  Age: 63 y.o. MRN: [id number]  Chief Complaint  Patient presents with   Annual Exam    Pt is a 63 yo female seen for CPE.  Pt ate early this morning.  Pt states she has been doing well.  Pt has lost 15 lbs by eating smaller portions and exercising regularly.  Plans to lose 15 more pounds.  Patient followed by gynecology.    Patient Active Problem List   Diagnosis Date Noted   Sarcoidosis 05/20/2023   Obesity (BMI 30-39.9) 06/12/2022   Cervical cancer screening 01/14/2018   Breast cancer screening 01/14/2018   Need for Tdap vaccination 01/14/2018   Essential hypertension 11/18/2017   Sleep apnea 07/29/2017   Borderline diabetes 07/29/2017   Visit for preventive health examination 12/06/2016   Thyromegaly 12/06/2016   Hyperlipidemia 05/11/2015   Bipolar affective disorder in remission (HCC) 05/11/2015   BACK PAIN, CHRONIC 09/26/2007   Past Medical History:  Diagnosis Date   Allergy    Anemia    Anxiety    Arthritis    Bipolar disorder (HCC)    Depression    Hyperlipidemia    Sleep apnea 07/29/2017   Past Surgical History:  Procedure Laterality Date   CESAREAN SECTION     SIGMOIDOSCOPY     Social History   Tobacco Use   Smoking status: Former    Current packs/day: 0.00    Types: Cigarettes    Quit date: 10/29/1988    Years since quitting: 35.2   Smokeless tobacco: Never  Vaping Use   Vaping status: Never Used  Substance Use Topics   Alcohol use: Yes    Alcohol/week: 0.0 standard drinks of alcohol    Comment: rare   Drug use: No   Family History  Problem Relation Age of Onset   Hypertension Other    Coronary artery disease Other    Colon cancer Other    Diabetes Other    Heart disease Mother    Hypertension Mother    Diabetes Father    Hypertension Father    Allergies  Allergen Reactions   Fish Oil Rash   Penicillins Rash and Hives       ROS Negative unless stated above    Objective:     BP 120/80 (BP Location: Left Arm, Patient Position: Sitting, Cuff Size: Normal)   Pulse 69   Temp 99 F (37.2 C) (Oral)   Ht 5' 2.5" (1.588 m)   Wt 174 lb 9.6 oz (79.2 kg)   SpO2 98%   BMI 31.43 kg/m  BP Readings from Last 3 Encounters:  01/15/24 120/80  05/20/23 130/64  11/16/22 (!) 158/84   Wt Readings from Last 3 Encounters:  01/15/24 174 lb 9.6 oz (79.2 kg)  05/20/23 184 lb (83.5 kg)  11/16/22 185 lb 3.2 oz (84 kg)      Physical Exam Constitutional:      Appearance: Normal appearance.  HENT:     Head: Normocephalic and atraumatic.     Right Ear: Tympanic membrane, ear canal and external ear normal.     Left Ear: Tympanic membrane, ear canal and external ear normal.     Nose: Nose normal.     Mouth/Throat:     Mouth: Mucous membranes are moist.     Pharynx: No oropharyngeal exudate or posterior oropharyngeal erythema.  Eyes:     General: No scleral icterus.  Extraocular Movements: Extraocular movements intact.     Conjunctiva/sclera: Conjunctivae normal.     Pupils: Pupils are equal, round, and reactive to light.  Neck:     Thyroid: No thyromegaly.  Cardiovascular:     Rate and Rhythm: Normal rate and regular rhythm.     Pulses: Normal pulses.     Heart sounds: Normal heart sounds. No murmur heard.    No friction rub.  Pulmonary:     Effort: Pulmonary effort is normal.     Breath sounds: Normal breath sounds. No wheezing, rhonchi or rales.  Abdominal:     General: Bowel sounds are normal.     Palpations: Abdomen is soft.     Tenderness: There is no abdominal tenderness.  Musculoskeletal:        General: No deformity. Normal range of motion.  Lymphadenopathy:     Cervical: No cervical adenopathy.  Skin:    General: Skin is warm and dry.     Findings: No lesion.  Neurological:     General: No focal deficit present.     Mental Status: She is alert and oriented to person, place, and time.   Psychiatric:        Mood and Affect: Mood normal.        Thought Content: Thought content normal.       01/15/2024    4:33 PM 10/05/2022    3:42 PM 07/05/2022    3:38 PM  Depression screen PHQ 2/9  Decreased Interest 1 0 1  Down, Depressed, Hopeless 1 0 0  PHQ - 2 Score 2 0 1  Altered sleeping 1 0 1  Tired, decreased energy 1 0 1  Change in appetite 0 0 0  Feeling bad or failure about yourself  0 0 0  Trouble concentrating 0 0 0  Moving slowly or fidgety/restless 0 0 0  Suicidal thoughts 0 0 0  PHQ-9 Score 4 0 3  Difficult doing work/chores Not difficult at all Not difficult at all Somewhat difficult      01/15/2024    4:33 PM  GAD 7 : Generalized Anxiety Score  Nervous, Anxious, on Edge 0  Control/stop worrying 0  Worry too much - different things 0  Trouble relaxing 0  Restless 0  Easily annoyed or irritable 0  Afraid - awful might happen 0  Total GAD 7 Score 0      No results found for any visits on 01/15/24.    Assessment & Plan:  Well adult exam -     CBC with Differential/Platelet; Future -     Comprehensive metabolic panel; Future -     Hemoglobin A1c; Future -     Lipid panel; Future -     TSH; Future -     T4, free; Future  Essential hypertension -     Comprehensive metabolic panel; Future -     TSH; Future -     T4, free; Future -     Benazepril-hydroCHLOROthiazide; Take 1 tablet by mouth daily.  Dispense: 90 tablet; Refill: 3 -     Potassium Chloride Crys ER; TAKE 1 TABLET (20 MEQ TOTAL) BY MOUTH DAILY.  Dispense: 90 tablet; Refill: 3  Mixed hyperlipidemia -     Pravastatin Sodium; Take 1 tablet (80 mg total) by mouth daily.  Dispense: 90 tablet; Refill: 3  Seasonal allergies -     Montelukast Sodium; Take 1 tablet (10 mg total) by mouth at bedtime.  Dispense: 90 tablet; Refill: 3  Class 1 obesity with serious comorbidity and body mass index (BMI) of 31.0 to 31.9 in adult, unspecified obesity type -Body mass index is 31.43 kg/m.  Vitamin D  deficiency -     VITAMIN D 25 Hydroxy (Vit-D Deficiency, Fractures); Future   Age-appropriate health screenings discussed.  Will obtain labs.  Pap with gynecology.  Immunizations reviewed.  Consider shingles vaccine.  Consider pneumonia vaccine at age 40.  Colonoscopy done 05/03/2014 with 10-year recall 05/03/2024.  Next CPE in 1 year.  BP well-controlled.  Continue benazepril-hydrochlorothiazide 20-25 mg daily and potassium 20 mEq daily.  Continue lifestyle modifications.  Continue pravastatin 80 mg daily.  Singulair as needed for seasonal allergies.  Return in about 6 months (around 07/17/2024).  Sooner if needed.  Deeann Saint, MD

## 2024-01-16 ENCOUNTER — Encounter: Payer: Self-pay | Admitting: Family Medicine

## 2024-01-16 LAB — COMPREHENSIVE METABOLIC PANEL
ALT: 12 U/L (ref 0–35)
AST: 19 U/L (ref 0–37)
Albumin: 4.3 g/dL (ref 3.5–5.2)
Alkaline Phosphatase: 47 U/L (ref 39–117)
BUN: 14 mg/dL (ref 6–23)
CO2: 28 meq/L (ref 19–32)
Calcium: 9.5 mg/dL (ref 8.4–10.5)
Chloride: 105 meq/L (ref 96–112)
Creatinine, Ser: 0.73 mg/dL (ref 0.40–1.20)
GFR: 89.24 mL/min (ref >60.00)
Glucose, Bld: 80 mg/dL (ref 70–99)
Potassium: 3.7 meq/L (ref 3.5–5.1)
Sodium: 141 meq/L (ref 135–145)
Total Bilirubin: 0.3 mg/dL (ref 0.2–1.2)
Total Protein: 7.3 g/dL (ref 6.0–8.3)

## 2024-01-16 LAB — CBC WITH DIFFERENTIAL/PLATELET
Basophils Absolute: 0.1 10*3/uL (ref 0.0–0.1)
Basophils Relative: 0.7 % (ref 0.0–3.0)
Eosinophils Absolute: 0.1 10*3/uL (ref 0.0–0.7)
Eosinophils Relative: 0.8 % (ref 0.0–5.0)
HCT: 40.5 % (ref 36.0–46.0)
Hemoglobin: 13.5 g/dL (ref 12.0–15.0)
Lymphocytes Relative: 56.4 % — ABNORMAL HIGH (ref 12.0–46.0)
Lymphs Abs: 5.8 10*3/uL — ABNORMAL HIGH (ref 0.7–4.0)
MCHC: 33.3 g/dL (ref 30.0–36.0)
MCV: 92.8 fl (ref 78.0–100.0)
Monocytes Absolute: 1 10*3/uL (ref 0.1–1.0)
Monocytes Relative: 9.5 % (ref 3.0–12.0)
Neutro Abs: 3.3 10*3/uL (ref 1.4–7.7)
Neutrophils Relative %: 32.6 % — ABNORMAL LOW (ref 43.0–77.0)
Platelets: 188 10*3/uL (ref 150.0–400.0)
RBC: 4.36 Mil/uL (ref 3.87–5.11)
RDW: 13.3 % (ref 11.5–15.5)
WBC: 10.2 10*3/uL (ref 4.0–10.5)

## 2024-01-16 LAB — T4, FREE: Free T4: 0.73 ng/dL (ref 0.60–1.60)

## 2024-01-16 LAB — LIPID PANEL
Cholesterol: 141 mg/dL (ref 0–200)
HDL: 41.4 mg/dL (ref >39.00)
LDL Cholesterol: 85 mg/dL (ref 0–99)
NonHDL: 99.83
Total CHOL/HDL Ratio: 3
Triglycerides: 76 mg/dL (ref 0.0–149.0)
VLDL: 15.2 mg/dL (ref 0.0–40.0)

## 2024-01-16 LAB — TSH: TSH: 2.15 u[IU]/mL (ref 0.35–5.50)

## 2024-01-16 LAB — VITAMIN D 25 HYDROXY (VIT D DEFICIENCY, FRACTURES): VITD: 30.99 ng/mL (ref 30.00–100.00)

## 2024-01-16 LAB — HEMOGLOBIN A1C: Hgb A1c MFr Bld: 6.1 % (ref 4.6–6.5)

## 2024-05-20 ENCOUNTER — Other Ambulatory Visit: Payer: Self-pay | Admitting: Family Medicine

## 2024-07-30 ENCOUNTER — Telehealth: Payer: Self-pay | Admitting: Family Medicine

## 2024-07-30 DIAGNOSIS — Z1211 Encounter for screening for malignant neoplasm of colon: Secondary | ICD-10-CM

## 2024-07-30 NOTE — Telephone Encounter (Signed)
 Copied from CRM [id number]. Topic: Referral - Question >> Jul 30, 2024  4:26 PM Donee H wrote: Reason for CRM: Patient calling regarding having referral for colonoscopy. She stated had one scheduled in July but was unable to go due to being out of town. She wanted to know if she has to place a request for another one. Please follow up with patient  [phone]

## 2024-07-31 NOTE — Telephone Encounter (Signed)
 Copied from CRM [id number]. Topic: Referral - Question >> Jul 30, 2024  4:26 PM Donee H wrote: Reason for CRM: Patient calling regarding having referral for colonoscopy. She stated had one scheduled in July but was unable to go due to being out of town. She wanted to know if she has to place a request for another one. Please follow up with patient  [phone]

## 2024-08-03 NOTE — Telephone Encounter (Signed)
 Called and spoke with patient, referral has been placed per Dr. Mercer. Patient is aware

## 2024-10-05 ENCOUNTER — Telehealth: Payer: BC Managed Care – PPO | Admitting: Psychiatry

## 2024-11-06 ENCOUNTER — Ambulatory Visit

## 2024-11-06 VITALS — Ht 62.5 in | Wt 171.0 lb

## 2024-11-06 DIAGNOSIS — Z1211 Encounter for screening for malignant neoplasm of colon: Secondary | ICD-10-CM

## 2024-11-06 MED ORDER — NA SULFATE-K SULFATE-MG SULF 17.5-3.13-1.6 GM/177ML PO SOLN: 1.0000 | Freq: Once | ORAL | 0 refills | Status: AC

## 2024-11-06 NOTE — Progress Notes (Signed)
 No egg or soy allergy known to patient  No issues known to pt with past sedation with any surgeries or procedures Patient denies ever being told they had issues or difficulty with intubation  No FH of Malignant Hyperthermia  Pt is not on diet pills (stopped Ozempic  and will not start this back before colonoscopy)  Pt is not on  home 02  Pt is not on blood thinners  Pt denies issues with constipation  No A fib or A flutter Have any cardiac testing pending--No Pt can ambulate  Pt denies use of chewing tobacco Discussed diabetic I weight loss medication holds Discussed NSAID holds Checked BMI Pt instructed to use Singlecare.com or GoodRx for a price reduction on prep  Patient's chart reviewed by Norleen Schillings CNRA prior to previsit and patient appropriate for the [facility].  Pre visit completed and red dot placed by patient's name on their procedure day (on provider's schedule).

## 2024-11-08 ENCOUNTER — Other Ambulatory Visit: Payer: Self-pay | Admitting: Psychiatry

## 2024-11-08 DIAGNOSIS — F3181 Bipolar II disorder: Secondary | ICD-10-CM

## 2024-11-09 ENCOUNTER — Telehealth: Payer: Self-pay | Admitting: Psychiatry

## 2024-11-09 NOTE — Telephone Encounter (Signed)
 Pt needs rf of depakote 

## 2024-11-10 NOTE — Telephone Encounter (Signed)
Dr. Jennelle Human sent in RF.

## 2024-11-18 ENCOUNTER — Ambulatory Visit (INDEPENDENT_AMBULATORY_CARE_PROVIDER_SITE_OTHER): Admitting: Psychiatry

## 2024-11-18 ENCOUNTER — Encounter: Payer: Self-pay | Admitting: Psychiatry

## 2024-11-18 DIAGNOSIS — F411 Generalized anxiety disorder: Secondary | ICD-10-CM | POA: Diagnosis not present

## 2024-11-18 DIAGNOSIS — F3181 Bipolar II disorder: Secondary | ICD-10-CM | POA: Diagnosis not present

## 2024-11-18 DIAGNOSIS — F5105 Insomnia due to other mental disorder: Secondary | ICD-10-CM | POA: Diagnosis not present

## 2024-11-18 MED ORDER — ALPRAZOLAM 0.5 MG PO TABS: 0.5000 mg | ORAL_TABLET | Freq: Two times a day (BID) | ORAL | 0 refills | Status: AC | PRN

## 2024-11-18 MED ORDER — ZOLPIDEM TARTRATE 5 MG PO TABS: 5.0000 mg | ORAL_TABLET | Freq: Every evening | ORAL | 1 refills | Status: AC | PRN

## 2024-11-18 MED ORDER — SERTRALINE HCL 50 MG PO TABS: 50.0000 mg | ORAL_TABLET | Freq: Every day | ORAL | 3 refills | Status: AC

## 2024-11-18 MED ORDER — DIVALPROEX SODIUM ER 250 MG PO TB24: 750.0000 mg | ORAL_TABLET | Freq: Every day | ORAL | 3 refills | Status: AC

## 2024-11-18 NOTE — Progress Notes (Signed)
 Linda Kerr [id number] [birth year] 64 y.o.  Virtual Visit via Telephone Note  I connected with pt by telephone and verified that I am speaking with the correct person using two identifiers.   I discussed the limitations, risks, security and privacy concerns of performing an evaluation and management service by telephone and the availability of in person appointments. I also discussed with the patient that there may be a patient responsible charge related to this service. The patient expressed understanding and agreed to proceed.  I discussed the assessment and treatment plan with the patient. The patient was provided an opportunity to ask questions and all were answered. The patient agreed with the plan and demonstrated an understanding of the instructions.   The patient was advised to call back or seek an in-person evaluation if the symptoms worsen or if the condition fails to improve as anticipated.  I provided 15 minutes of non-face-to-face time during this encounter. The call started at 420 and ended at 435. The patient was located at home and the provider was located office.   Subjective:   Patient ID:  Linda Kerr is a 64 y.o. (DOB 10/07/1961) female.  Chief Complaint:  Chief Complaint  Patient presents with   Follow-up   Anxiety    HPI Linda Kerr presents to the office today for follow-up of bipolar disorder and generalized anxiety disorder.  seen 07/2019.  No meds were changed.  08/2020 appt doing well without med changed.  08/07/2021 appointment with the following noted: Sometimes Xanax  for anxiety or sleep. Change hormone tablet. Consistent with psych meds Depakote  750, sertraline  50. Doinng well overall.  No persistent depressions. D moved back home. Settled in now.  Otherwise doing well.  Still pleased with meds.  Exercising.   Ambien  prn. No SE. Needs to work on weight and working out.  10/04/22 appt noted: OnConsistent with psych meds Depakote  750,  sertraline  50. And prn alprazolam  as needed. Pretty good.  Nephew drowned fishing.   Mood has been stable without swings.  Exercising.  Managing anxiety with behavioral techniques.  Sleep about like usual. No SE Lost 10#.  Checkups good..  Exercising.   No comments from family. Patient reports stable mood and denies depressed or irritable moods.  Patient reports recent difficulty with anxiety with worry.  In proportion to situation.  Able to function. 1 panic since here.  Patient denies difficulty with sleep initiation or maintenance. Average 5-6 hours but naps in daytime.  Night owl. Bothers H a little.  Denies appetite disturbance.  Patient reports that energy and motivation have been good.  Patient denies any difficulty with concentration.  Patient denies any suicidal ideation. Plan no changes  10/03/23 appt noted: Holding on fine.  Mood pretty stable.  No sig dep.  Some down with holidays.   Meds as above without SE.  No longer Ambien .  Used prn. Sometimes anxiety but manages it.   Sleep is pretty good.   No new concerns.    Health is stable.  Would like to lose wt.  Working out and still can't lose wt.   Questions about wt and meds and GLP-1.    11/18/24 appt noted: Consistent with psych meds Depakote  ER 750, sertraline  50. And prn alprazolam  as needed. Ambien  wanted rarely. No sig new meds. Still doing fine.  A little mild Xmas low but not severe.  Changes in [place] dynamics but manages it.  Don't dwell.  Mood stable without major swings. Anxiety is a lot  better.  Despite new boss which is better.  Helped a lot  Sertraline  working for anxiety.   Still working on weight.  Tried Ozempic  but too expensive.   Staying active.    Past Psychiatric Medication Trials: Alprazolam , Ambien  5 mg awakening, clonazepam, sertraline  75, Depakote  ER 750, lithium, citalopram, nefazodone, Wellbutrin agitated She has been maintained on Depakote  ER 750 mg nightly with sertraline  50 mg daily for seen in  our practice in 2003.  Review of Systems:  Review of Systems  Cardiovascular:  Negative for palpitations.       BP a little of a problem.  Gastrointestinal:  Negative for nausea.  Neurological:  Negative for tremors.  Psychiatric/Behavioral:  Negative for dysphoric mood, hallucinations and suicidal ideas.     Medications: I have reviewed the patient's current medications.  Current Outpatient Medications  Medication Sig Dispense Refill   benazepril -hydrochlorthiazide (LOTENSIN  HCT) 20-25 MG tablet Take 1 tablet by mouth daily. 90 tablet 3   fluticasone  (FLONASE ) 50 MCG/ACT nasal spray Place 1 spray into both nostrils daily.     montelukast  (SINGULAIR ) 10 MG tablet Take 1 tablet (10 mg total) by mouth at bedtime. 90 tablet 3   norethindrone-ethinyl estradiol (FEMHRT 1/5) 1-5 MG-MCG TABS tablet Take 1 tablet by mouth daily.     potassium chloride  SA (KLOR-CON  M20) 20 MEQ tablet TAKE 1 TABLET (20 MEQ TOTAL) BY MOUTH DAILY. 90 tablet 3   pravastatin  (PRAVACHOL ) 80 MG tablet Take 1 tablet (80 mg total) by mouth daily. 90 tablet 3   ALPRAZolam  (XANAX ) 0.5 MG tablet Take 1 tablet (0.5 mg total) by mouth 2 (two) times daily as needed for anxiety. 30 tablet 0   azelastine (ASTELIN) 0.1 % nasal spray Place 1 spray into both nostrils 2 (two) times daily.     divalproex  (DEPAKOTE  ER) 250 MG 24 hr tablet Take 3 tablets (750 mg total) by mouth at bedtime. 270 tablet 3   ibuprofen  (ADVIL ) 600 MG tablet Take 600 mg by mouth every 8 (eight) hours as needed.     sertraline  (ZOLOFT ) 50 MG tablet Take 1 tablet (50 mg total) by mouth at bedtime. 90 tablet 3   zolpidem  (AMBIEN ) 5 MG tablet Take 1 tablet (5 mg total) by mouth at bedtime as needed for sleep. 30 tablet 1   No current facility-administered medications for this visit.    Medication Side Effects: None  Allergies:  Allergies  Allergen Reactions   Fish Oil Rash   Penicillins Rash and Hives    Past Medical History:  Diagnosis Date    Allergy    Anemia    Anxiety    Arthritis    Bipolar disorder (HCC)    Depression    Hyperlipidemia    Sleep apnea 07/29/2017    Family History  Problem Relation Age of Onset   Heart disease Mother    Hypertension Mother    Diabetes Father    Hypertension Father    Hypertension Other    Coronary artery disease Other    Colon cancer Other    Diabetes Other    Rectal cancer Neg Hx    Stomach cancer Neg Hx    Esophageal cancer Neg Hx     Social History   Socioeconomic History   Marital status: Married    Spouse name: Not on file   Number of children: 1   Years of education: Not on file   Highest education level: Master's degree (e.g., MA, MS, MEng, MEd, MSW, MBA)  Occupational History   Occupation: Garment/textile Technologist: [employer]  Tobacco Use   Smoking status: Former    Current packs/day: 0.00    Types: Cigarettes    Quit date: 10/29/1988    Years since quitting: 36.0   Smokeless tobacco: Never  Vaping Use   Vaping status: Never Used  Substance and Sexual Activity   Alcohol use: Yes    Alcohol/week: 0.0 standard drinks of alcohol    Comment: rare   Drug use: No   Sexual activity: Yes  Other Topics Concern   Not on file  Social History Narrative   Not on file   Social Drivers of Health   Tobacco Use: Medium Risk (11/18/2024)   Patient History    Smoking Tobacco Use: Former    Smokeless Tobacco Use: Never    Passive Exposure: Not on file  Financial Resource Strain: Low Risk (10/05/2022)   Overall Financial Resource Strain (CARDIA)    Difficulty of Paying Living Expenses: Not hard at all  Food Insecurity: Low Risk (08/29/2023)   Received from [facility]   Epic    Within the past 12 months, you worried that your food would run out before you got money to buy more: Never true    Within the past 12 months, the food you bought just didn't last and you didn't have money to get more. : Never true  Transportation Needs: No Transportation Needs  (08/29/2023)   Received from [facility]    In the past 12 months, has lack of reliable transportation kept you from medical appointments, meetings, work or from getting things needed for daily living? : No  Physical Activity: Insufficiently Active (10/05/2022)   Exercise Vital Sign    Days of Exercise per Week: 2 days    Minutes of Exercise per Session: 70 min  Stress: No Stress Concern Present (10/05/2022)   [employer] of Occupational Health - Occupational Stress Questionnaire    Feeling of Stress : Only a little  Social Connections: Socially Integrated (10/05/2022)   Social Connection and Isolation Panel    Frequency of Communication with Friends and Family: More than three times a week    Frequency of Social Gatherings with Friends and Family: Once a week    Attends Religious Services: More than 4 times per year    Active Member of Clubs or Organizations: Yes    Attends Banker Meetings: 1 to 4 times per year    Marital Status: Married  Catering Manager Violence: Not on file  Depression (PHQ2-9): Low Risk (01/15/2024)   Depression (PHQ2-9)    PHQ-2 Score: 4  Alcohol Screen: Low Risk (10/05/2022)   Alcohol Screen    Last Alcohol Screening Score (AUDIT): 2  Housing: Low Risk (08/29/2023)   Received from [facility]   Epic    What is your living situation today?: I have a steady place to live    Think about the place you live. Do you have problems with any of the following? Choose all that apply:: None/None on this list  Utilities: Low Risk (08/29/2023)   Received from [facility]   Utilities    In the past 12 months has the electric, gas, oil, or water company threatened to shut off services in your home? : No  Health Literacy: Not on file    Past Medical History, Surgical history, Social history, and Family history were reviewed and updated as appropriate.   Please  see review of systems for further details on the patient's review from  today.   Objective:   Physical Exam:  There were no vitals taken for this visit.  Physical Exam Constitutional:      General: She is not in acute distress.    Appearance: She is well-developed.  Musculoskeletal:        General: No deformity.  Neurological:     Mental Status: She is alert and oriented to person, place, and time.     Coordination: Coordination normal.  Psychiatric:        Attention and Perception: Attention and perception normal. She does not perceive auditory or visual hallucinations.        Mood and Affect: Mood is not anxious or depressed. Affect is not labile, blunt or inappropriate.        Speech: Speech normal. Speech is not slurred.        Behavior: Behavior normal.        Thought Content: Thought content normal. Thought content does not include homicidal or suicidal ideation. Thought content does not include homicidal or suicidal plan.        Cognition and Memory: Cognition and memory normal.        Judgment: Judgment normal.     Comments: Insight intact. No delusions.  Rare anxiety manageable.      Lab Review:     Component Value Date/Time   NA 141 01/15/2024 1622   K 3.7 01/15/2024 1622   CL 105 01/15/2024 1622   CO2 28 01/15/2024 1622   GLUCOSE 80 01/15/2024 1622   BUN 14 01/15/2024 1622   CREATININE 0.73 01/15/2024 1622   CREATININE 0.67 10/17/2021 1302   CREATININE 0.60 11/13/2019 1505   CALCIUM  9.5 01/15/2024 1622   PROT 7.3 01/15/2024 1622   ALBUMIN 4.3 01/15/2024 1622   AST 19 01/15/2024 1622   AST 17 10/17/2021 1302   ALT 12 01/15/2024 1622   ALT 12 10/17/2021 1302   ALKPHOS 47 01/15/2024 1622   BILITOT 0.3 01/15/2024 1622   BILITOT 0.4 10/17/2021 1302   GFRNONAA >60 10/17/2021 1302   GFRAA >60 11/25/2019 1456       Component Value Date/Time   WBC 10.2 01/15/2024 1622   RBC 4.36 01/15/2024 1622   HGB 13.5 01/15/2024 1622   HGB 13.3 10/17/2021 1302   HCT 40.5 01/15/2024 1622   PLT 188.0 01/15/2024 1622   PLT 210 10/17/2021  1302   MCV 92.8 01/15/2024 1622   MCH 30.2 10/17/2021 1302   MCHC 33.3 01/15/2024 1622   RDW 13.3 01/15/2024 1622   LYMPHSABS 5.8 (H) 01/15/2024 1622   MONOABS 1.0 01/15/2024 1622   EOSABS 0.1 01/15/2024 1622   BASOSABS 0.1 01/15/2024 1622    No results found for: POCLITH, LITHIUM   Lab Results  Component Value Date   VALPROATE 58.2 12/06/2016     .res Assessment: Plan:    Denetra was seen today for follow-up and anxiety.  Diagnoses and all orders for this visit:  Bipolar II disorder (HCC) -     divalproex  (DEPAKOTE  ER) 250 MG 24 hr tablet; Take 3 tablets (750 mg total) by mouth at bedtime.  Generalized anxiety disorder -     sertraline  (ZOLOFT ) 50 MG tablet; Take 1 tablet (50 mg total) by mouth at bedtime. -     ALPRAZolam  (XANAX ) 0.5 MG tablet; Take 1 tablet (0.5 mg total) by mouth 2 (two) times daily as needed for anxiety.  Insomnia due to mental  condition -     zolpidem  (AMBIEN ) 5 MG tablet; Take 1 tablet (5 mg total) by mouth at bedtime as needed for sleep.     Disc bipolar and anxiety disorder acurrent meds. Stable with meds for extended time.   Consider vitamin D  bc limited sun exposure.   No problems with meds.  No med changes indicated.  Extensive disc of meds and wt gain and alternatives of CBZ but a lot of DDI, and Vraylar but cost and her med-sensitivity could be an issue.  Disc pros and cons.  And ways to change.  She will try to increase exercise instead for now.  Continue Depakote  ER 750 mg HS Sertraline  50 mg daily.  OK prn Ambien  5 mg.  Disc risk.  Used rarely Wants prn Xanax  used rarely.   No med changes  We discussed the short-term risks associated with benzodiazepines including sedation and increased fall risk among others.  Discussed long-term side effect risk including dependence, potential withdrawal symptoms, and the potential eventual dose-related risk of dementia.  But recent studies from 2020 dispute this association between  benzodiazepines and dementia risk. Newer studies in 2020 do not support an association with dementia.   FU 1 year bc stable  Lorene Macintosh, MD, DFAPA     Please see After Visit Summary for patient specific instructions.  Future Appointments  Date Time Provider Department Center  11/20/2024  2:30 PM Pyrtle, Gordy HERO, MD [facility]     No orders of the defined types were placed in this encounter.   -------------------------------

## 2024-11-20 ENCOUNTER — Encounter: Payer: Self-pay | Admitting: Internal Medicine

## 2024-11-20 ENCOUNTER — Ambulatory Visit: Admitting: Internal Medicine

## 2024-11-20 VITALS — BP 135/66 | HR 71 | Temp 98.1°F | Resp 12 | Ht 62.0 in | Wt 171.0 lb

## 2024-11-20 DIAGNOSIS — K635 Polyp of colon: Secondary | ICD-10-CM | POA: Diagnosis not present

## 2024-11-20 DIAGNOSIS — D125 Benign neoplasm of sigmoid colon: Secondary | ICD-10-CM

## 2024-11-20 DIAGNOSIS — K5730 Diverticulosis of large intestine without perforation or abscess without bleeding: Secondary | ICD-10-CM

## 2024-11-20 DIAGNOSIS — D120 Benign neoplasm of cecum: Secondary | ICD-10-CM

## 2024-11-20 DIAGNOSIS — Z1211 Encounter for screening for malignant neoplasm of colon: Secondary | ICD-10-CM

## 2024-11-20 DIAGNOSIS — K6389 Other specified diseases of intestine: Secondary | ICD-10-CM | POA: Diagnosis not present

## 2024-11-20 DIAGNOSIS — D122 Benign neoplasm of ascending colon: Secondary | ICD-10-CM

## 2024-11-20 DIAGNOSIS — D123 Benign neoplasm of transverse colon: Secondary | ICD-10-CM

## 2024-11-20 MED ORDER — SODIUM CHLORIDE 0.9 % IV SOLN: 500.0000 mL | Freq: Once | INTRAVENOUS | Status: DC

## 2024-11-20 NOTE — Progress Notes (Signed)
 Called to room to assist during endoscopic procedure.  Patient ID and intended procedure confirmed with present staff. Received instructions for my participation in the procedure from the performing physician.

## 2024-11-20 NOTE — Op Note (Signed)
 [facility] Patient Name: Linda Kerr Procedure Date: 11/20/2024 2:18 PM MRN: [id number] Endoscopist: Gordy CHRISTELLA Starch , MD, [id number] Age: 64 Referring MD:  Date of Birth: [birth year] Gender: Female Account #: [id number] Procedure:                Colonoscopy Indications:              Screening for colorectal malignant neoplasm, Last                            colonoscopy: 2015 Medicines:                Monitored Anesthesia Care Procedure:                Pre-Anesthesia Assessment:                           - Prior to the procedure, a History and Physical                            was performed, and patient medications and                            allergies were reviewed. The patient's tolerance of                            previous anesthesia was also reviewed. The risks                            and benefits of the procedure and the sedation                            options and risks were discussed with the patient.                            All questions were answered, and informed consent                            was obtained. Prior Anticoagulants: The patient has                            taken no anticoagulant or antiplatelet agents. ASA                            Grade Assessment: II - A patient with mild systemic                            disease. After reviewing the risks and benefits,                            the patient was deemed in satisfactory condition to                            undergo the procedure.  After obtaining informed consent, the colonoscope                            was passed under direct vision. Throughout the                            procedure, the patient's blood pressure, pulse, and                            oxygen saturations were monitored continuously. The                            CF [id number] #[id number] was introduced through the anus                            and advanced to the cecum, identified by                             appendiceal orifice and ileocecal valve. The                            colonoscopy was performed without difficulty. The                            patient tolerated the procedure well. The quality                            of the bowel preparation was good. The ileocecal                            valve, appendiceal orifice, and rectum were                            photographed. Scope In: 2:31:06 PM Scope Out: 2:49:10 PM Scope Withdrawal Time: 0 hours 14 minutes 27 seconds  Total Procedure Duration: 0 hours 18 minutes 4 seconds  Findings:                 The digital rectal exam was normal.                           A 2 mm polyp was found in the cecum. The polyp was                            sessile. The polyp was removed with a cold biopsy                            forceps. Resection and retrieval were complete.                           A 3 mm polyp was found in the ascending colon. The                            polyp was sessile. The polyp  was removed with a                            cold biopsy forceps. Resection and retrieval were                            complete.                           Two sessile polyps were found in the transverse                            colon. The polyps were 4 to 5 mm in size. These                            polyps were removed with a cold snare. Resection                            and retrieval were complete.                           A 7 mm polyp was found in the sigmoid colon. The                            polyp was sessile. The polyp was removed with a                            cold snare. Resection and retrieval were complete.                           Multiple medium-mouthed and small-mouthed                            diverticula were found in the transverse colon and                            hepatic flexure.                           The retroflexed view of the distal rectum and anal                             verge was normal and showed no anal or rectal                            abnormalities. Complications:            No immediate complications. Estimated Blood Loss:     Estimated blood loss was minimal. Impression:               - One 2 mm polyp in the cecum, removed with a cold                            biopsy forceps. Resected and retrieved.                           -  One 3 mm polyp in the ascending colon, removed                            with a cold biopsy forceps. Resected and retrieved.                           - Two 4 to 5 mm polyps in the transverse colon,                            removed with a cold snare. Resected and retrieved.                           - One 7 mm polyp in the sigmoid colon, removed with                            a cold snare. Resected and retrieved.                           - Mild diverticulosis in the transverse colon and                            at the hepatic flexure.                           - The distal rectum and anal verge are normal on                            retroflexion view. Recommendation:           - Patient has a contact number available for                            emergencies. The signs and symptoms of potential                            delayed complications were discussed with the                            patient. Return to normal activities tomorrow.                            Written discharge instructions were provided to the                            patient.                           - Resume previous diet.                           - Continue present medications.                           - Await pathology results.                           -  Repeat colonoscopy is recommended. The                            colonoscopy date will be determined after pathology                            results from today's exam become available for                            review. Gordy CHRISTELLA Starch, MD 11/20/2024 2:53:09 PM This  report has been signed electronically.

## 2024-11-20 NOTE — Patient Instructions (Addendum)
 Resume previous diet.  Continue present medications.  Await pathology results.   Repeat colonoscopy is recommended. The colonoscopy date will be determined after pathology results from today's exam have become available for review.   YOU HAD AN ENDOSCOPIC PROCEDURE TODAY AT THE [facility]:   Refer to the procedure report that was given to you for any specific questions about what was found during the examination.  If the procedure report does not answer your questions, please call your gastroenterologist to clarify.  If you requested that your care partner not be given the details of your procedure findings, then the procedure report has been included in a sealed envelope for you to review at your convenience later.  YOU SHOULD EXPECT: Some feelings of bloating in the abdomen. Passage of more gas than usual.  Walking can help get rid of the air that was put into your GI tract during the procedure and reduce the bloating. If you had a lower endoscopy (such as a colonoscopy or flexible sigmoidoscopy) you may notice spotting of blood in your stool or on the toilet paper. If you underwent a bowel prep for your procedure, you may not have a normal bowel movement for a few days.  Please Note:  You might notice some irritation and congestion in your nose or some drainage.  This is from the oxygen used during your procedure.  There is no need for concern and it should clear up in a day or so.  SYMPTOMS TO REPORT IMMEDIATELY:  Following lower endoscopy (colonoscopy or flexible sigmoidoscopy):  Excessive amounts of blood in the stool  Significant tenderness or worsening of abdominal pains  Swelling of the abdomen that is new, acute  Fever of 100F or higher  For urgent or emergent issues, a gastroenterologist can be reached at any hour by calling [phone]. Do not use MyChart messaging for urgent concerns.    DIET:  We do recommend a small meal at first, but then you may proceed to  your regular diet.  Drink plenty of fluids but you should avoid alcoholic beverages for 24 hours.  ACTIVITY:  You should plan to take it easy for the rest of today and you should NOT DRIVE or use heavy machinery until tomorrow (because of the sedation medicines used during the test).    FOLLOW UP: Our staff will call the number listed on your records the next business day following your procedure.  We will call around 7:15- 8:00 am to check on you and address any questions or concerns that you may have regarding the information given to you following your procedure. If we do not reach you, we will leave a message.     If any biopsies were taken you will be contacted by phone or by letter within the next 1-3 weeks.  Please call us  at [phone] if you have not heard about the biopsies in 3 weeks.    SIGNATURES/CONFIDENTIALITY: You and/or your care partner have signed paperwork which will be entered into your electronic medical record.  These signatures attest to the fact that that the information above on your After Visit Summary has been reviewed and is understood.  Full responsibility of the confidentiality of this discharge information lies with you and/or your care-partner.

## 2024-11-20 NOTE — Progress Notes (Signed)
 Report to PACU, RN, vss, BBS= Clear.

## 2024-11-20 NOTE — Progress Notes (Signed)
 Pt's states no medical or surgical changes since previsit or office visit.

## 2024-11-20 NOTE — Progress Notes (Signed)
 "   GASTROENTEROLOGY PROCEDURE H&P NOTE   Primary Care Physician: Mercer Clotilda SAUNDERS, MD    Reason for Procedure:  Colon cancer screening  Plan:    Colonoscopy  Patient is appropriate for endoscopic procedure(s) in the ambulatory ([facility]) setting.  The nature of the procedure, as well as the risks, benefits, and alternatives were carefully and thoroughly reviewed with the patient. Ample time for discussion and questions allowed.  All questions were answered. The patient understood, was satisfied, and agreed with the plan to proceed.    HPI: Linda Kerr is a 64 y.o. female who presents for colonoscopy.  Medical history as below.  Tolerated the prep.  No recent chest pain or shortness of breath.  No abdominal pain today.  Past Medical History:  Diagnosis Date   Allergy    Anemia    Anxiety    Arthritis    Bipolar disorder (HCC)    Depression    Hyperlipidemia    Sleep apnea 07/29/2017    Past Surgical History:  Procedure Laterality Date   CESAREAN SECTION     SIGMOIDOSCOPY      Prior to Admission medications  Medication Sig Start Date End Date Taking? Authorizing Provider  benazepril -hydrochlorthiazide (LOTENSIN  HCT) 20-25 MG tablet Take 1 tablet by mouth daily. 01/15/24  Yes Mercer Clotilda SAUNDERS, MD  divalproex  (DEPAKOTE  ER) 250 MG 24 hr tablet Take 3 tablets (750 mg total) by mouth at bedtime. 11/18/24  Yes Cottle, Lorene KANDICE Raddle., MD  fluticasone  (FLONASE ) 50 MCG/ACT nasal spray Place 1 spray into both nostrils daily.   Yes [provider]  norethindrone-ethinyl estradiol (FEMHRT 1/5) 1-5 MG-MCG TABS tablet Take 1 tablet by mouth daily.   Yes [provider]  potassium chloride  SA (KLOR-CON  M20) 20 MEQ tablet TAKE 1 TABLET (20 MEQ TOTAL) BY MOUTH DAILY. 01/15/24  Yes Mercer Clotilda SAUNDERS, MD  pravastatin  (PRAVACHOL ) 80 MG tablet Take 1 tablet (80 mg total) by mouth daily. 01/15/24  Yes Mercer Clotilda SAUNDERS, MD  sertraline  (ZOLOFT ) 50 MG tablet Take 1 tablet (50 mg total) by  mouth at bedtime. 11/18/24  Yes Cottle, Lorene KANDICE Raddle., MD  zolpidem  (AMBIEN ) 5 MG tablet Take 1 tablet (5 mg total) by mouth at bedtime as needed for sleep. 11/18/24  Yes Cottle, Lorene KANDICE Raddle., MD  ALPRAZolam  (XANAX ) 0.5 MG tablet Take 1 tablet (0.5 mg total) by mouth 2 (two) times daily as needed for anxiety. 11/18/24   Cottle, Carey G Jr., MD  azelastine (ASTELIN) 0.1 % nasal spray Place 1 spray into both nostrils 2 (two) times daily.    [provider]  ibuprofen  (ADVIL ) 600 MG tablet Take 600 mg by mouth every 8 (eight) hours as needed. 12/18/21   [provider]  montelukast  (SINGULAIR ) 10 MG tablet Take 1 tablet (10 mg total) by mouth at bedtime. 01/15/24   Mercer Clotilda SAUNDERS, MD    Current Outpatient Medications  Medication Sig Dispense Refill   benazepril -hydrochlorthiazide (LOTENSIN  HCT) 20-25 MG tablet Take 1 tablet by mouth daily. 90 tablet 3   divalproex  (DEPAKOTE  ER) 250 MG 24 hr tablet Take 3 tablets (750 mg total) by mouth at bedtime. 270 tablet 3   fluticasone  (FLONASE ) 50 MCG/ACT nasal spray Place 1 spray into both nostrils daily.     norethindrone-ethinyl estradiol (FEMHRT 1/5) 1-5 MG-MCG TABS tablet Take 1 tablet by mouth daily.     potassium chloride  SA (KLOR-CON  M20) 20 MEQ tablet TAKE 1 TABLET (20 MEQ TOTAL) BY MOUTH  DAILY. 90 tablet 3   pravastatin  (PRAVACHOL ) 80 MG tablet Take 1 tablet (80 mg total) by mouth daily. 90 tablet 3   sertraline  (ZOLOFT ) 50 MG tablet Take 1 tablet (50 mg total) by mouth at bedtime. 90 tablet 3   zolpidem  (AMBIEN ) 5 MG tablet Take 1 tablet (5 mg total) by mouth at bedtime as needed for sleep. 30 tablet 1   ALPRAZolam  (XANAX ) 0.5 MG tablet Take 1 tablet (0.5 mg total) by mouth 2 (two) times daily as needed for anxiety. 30 tablet 0   azelastine (ASTELIN) 0.1 % nasal spray Place 1 spray into both nostrils 2 (two) times daily.     ibuprofen  (ADVIL ) 600 MG tablet Take 600 mg by mouth every 8 (eight) hours as needed.     montelukast  (SINGULAIR )  10 MG tablet Take 1 tablet (10 mg total) by mouth at bedtime. 90 tablet 3   Current Facility-Administered Medications  Medication Dose Route Frequency Provider Last Rate Last Admin   0.9 %  sodium chloride  infusion  500 mL Intravenous Once Tayona Sarnowski, Gordy HERO, MD        Allergies as of 11/20/2024 - Review Complete 11/20/2024  Allergen Reaction Noted   Fish oil Rash 12/23/2012   Penicillins Rash and Hives 11/25/2019    Family History  Problem Relation Age of Onset   Heart disease Mother    Hypertension Mother    Diabetes Father    Hypertension Father    Hypertension Other    Coronary artery disease Other    Colon cancer Other    Diabetes Other    Rectal cancer Neg Hx    Stomach cancer Neg Hx    Esophageal cancer Neg Hx     Social History   Socioeconomic History   Marital status: Married    Spouse name: Not on file   Number of children: 1   Years of education: Not on file   Highest education level: Master's degree (e.g., MA, MS, MEng, MEd, MSW, MBA)  Occupational History   Occupation: SCHOOL COUNSELOR    Employer: [employer]  Tobacco Use   Smoking status: Former    Current packs/day: 0.00    Types: Cigarettes    Quit date: 10/29/1988    Years since quitting: 36.0   Smokeless tobacco: Never  Vaping Use   Vaping status: Never Used  Substance and Sexual Activity   Alcohol use: Yes    Alcohol/week: 0.0 standard drinks of alcohol    Comment: rare   Drug use: No   Sexual activity: Yes  Other Topics Concern   Not on file  Social History Narrative   Not on file   Social Drivers of Health   Tobacco Use: Medium Risk (11/18/2024)   Patient History    Smoking Tobacco Use: Former    Smokeless Tobacco Use: Never    Passive Exposure: Not on file  Financial Resource Strain: Low Risk (10/05/2022)   Overall Financial Resource Strain (CARDIA)    Difficulty of Paying Living Expenses: Not hard at all  Food Insecurity: Low Risk (08/29/2023)   Received from [facility]    Epic    Within the past 12 months, you worried that your food would run out before you got money to buy more: Never true    Within the past 12 months, the food you bought just didn't last and you didn't have money to get more. : Never true  Transportation Needs: No Transportation Needs (08/29/2023)   Received from  [facility]   Transportation    In the past 12 months, has lack of reliable transportation kept you from medical appointments, meetings, work or from getting things needed for daily living? : No  Physical Activity: Insufficiently Active (10/05/2022)   Exercise Vital Sign    Days of Exercise per Week: 2 days    Minutes of Exercise per Session: 70 min  Stress: No Stress Concern Present (10/05/2022)   [employer] of Occupational Health - Occupational Stress Questionnaire    Feeling of Stress : Only a little  Social Connections: Socially Integrated (10/05/2022)   Social Connection and Isolation Panel    Frequency of Communication with Friends and Family: More than three times a week    Frequency of Social Gatherings with Friends and Family: Once a week    Attends Religious Services: More than 4 times per year    Active Member of Clubs or Organizations: Yes    Attends Banker Meetings: 1 to 4 times per year    Marital Status: Married  Catering Manager Violence: Not on file  Depression (PHQ2-9): Low Risk (01/15/2024)   Depression (PHQ2-9)    PHQ-2 Score: 4  Alcohol Screen: Low Risk (10/05/2022)   Alcohol Screen    Last Alcohol Screening Score (AUDIT): 2  Housing: Low Risk (08/29/2023)   Received from [facility]   Epic    What is your living situation today?: I have a steady place to live    Think about the place you live. Do you have problems with any of the following? Choose all that apply:: None/None on this list  Utilities: Low Risk (08/29/2023)   Received from [facility]   Utilities    In the past 12 months has the electric, gas, oil, or water  company threatened to shut off services in your home? : No  Health Literacy: Not on file    Physical Exam: Vital signs in last 24 hours: @BP  (!) 152/80   Pulse 70   Temp 98.1 F (36.7 C)   Ht 5' 2 (1.575 m)   Wt 171 lb (77.6 kg)   SpO2 98%   BMI 31.28 kg/m  GEN: NAD EYE: Sclerae anicteric ENT: MMM CV: Non-tachycardic Pulm: CTA b/l GI: Soft, NT/ND NEURO:  Alert & Oriented x 3   Gordy Starch, MD [facility]  11/20/2024 2:26 PM  "

## 2024-11-24 ENCOUNTER — Telehealth: Payer: Self-pay | Admitting: *Deleted

## 2024-11-24 NOTE — Telephone Encounter (Signed)
" °  Follow up Call-     11/20/2024    2:17 PM  Call back number  Post procedure Call Back phone  # [phone]  Permission to leave phone message Yes     Patient questions:  Do you have a fever, pain , or abdominal swelling? No. Pain Score  0 *  Have you tolerated food without any problems? Yes.    Have you been able to return to your normal activities? Yes.    Do you have any questions about your discharge instructions: Diet   No. Medications  No. Follow up visit  No.  Do you have questions or concerns about your Care? No.  Actions: * If pain score is 4 or above: No action needed, pain <4.   "

## 2024-11-25 ENCOUNTER — Ambulatory Visit: Payer: Self-pay | Admitting: Internal Medicine

## 2024-11-25 LAB — SURGICAL PATHOLOGY
